# Patient Record
Sex: Female | Born: 1955 | Race: White | Hispanic: No | Marital: Single | Smoking: Never smoker
Health system: Southern US, Community
[De-identification: ages and names within clinical notes are randomized; demographics above are authoritative.]

## PROBLEM LIST (undated history)

## (undated) DIAGNOSIS — K219 Gastro-esophageal reflux disease without esophagitis: Secondary | ICD-10-CM

## (undated) DIAGNOSIS — F419 Anxiety disorder, unspecified: Secondary | ICD-10-CM

## (undated) DIAGNOSIS — Z01419 Encounter for gynecological examination (general) (routine) without abnormal findings: Secondary | ICD-10-CM

## (undated) DIAGNOSIS — M2550 Pain in unspecified joint: Secondary | ICD-10-CM

## (undated) DIAGNOSIS — I776 Arteritis, unspecified: Secondary | ICD-10-CM

## (undated) DIAGNOSIS — C4491 Basal cell carcinoma of skin, unspecified: Secondary | ICD-10-CM

## (undated) DIAGNOSIS — R51 Headache: Secondary | ICD-10-CM

## (undated) DIAGNOSIS — R12 Heartburn: Secondary | ICD-10-CM

## (undated) DIAGNOSIS — N189 Chronic kidney disease, unspecified: Secondary | ICD-10-CM

## (undated) DIAGNOSIS — M1990 Unspecified osteoarthritis, unspecified site: Secondary | ICD-10-CM

## (undated) DIAGNOSIS — R7303 Prediabetes: Secondary | ICD-10-CM

## (undated) DIAGNOSIS — I7782 Antineutrophilic cytoplasmic antibody (ANCA) vasculitis: Secondary | ICD-10-CM

## (undated) DIAGNOSIS — E079 Disorder of thyroid, unspecified: Secondary | ICD-10-CM

## (undated) DIAGNOSIS — G5600 Carpal tunnel syndrome, unspecified upper limb: Secondary | ICD-10-CM

## (undated) DIAGNOSIS — M7989 Other specified soft tissue disorders: Secondary | ICD-10-CM

## (undated) DIAGNOSIS — I10 Essential (primary) hypertension: Secondary | ICD-10-CM

## (undated) HISTORY — DX: Heartburn: R12

## (undated) HISTORY — PX: OTHER SURGICAL HISTORY: SHX169

## (undated) HISTORY — PX: REFRACTIVE SURGERY: SHX103

## (undated) HISTORY — DX: Prediabetes: R73.03

## (undated) HISTORY — DX: Gastro-esophageal reflux disease without esophagitis: K21.9

## (undated) HISTORY — PX: KNEE ARTHROSCOPY: SUR90

## (undated) HISTORY — PX: POLYPECTOMY: SHX149

## (undated) HISTORY — DX: Other specified soft tissue disorders: M79.89

## (undated) HISTORY — DX: Arteritis, unspecified: I77.6

## (undated) HISTORY — DX: Pain in unspecified joint: M25.50

## (undated) HISTORY — PX: CARPAL TUNNEL RELEASE: SHX101

## (undated) HISTORY — DX: Chronic kidney disease, unspecified: N18.9

## (undated) HISTORY — DX: Anxiety disorder, unspecified: F41.9

## (undated) HISTORY — DX: Disorder of thyroid, unspecified: E07.9

## (undated) HISTORY — PX: BUNIONECTOMY: SHX129

## (undated) HISTORY — DX: Encounter for gynecological examination (general) (routine) without abnormal findings: Z01.419

## (undated) HISTORY — PX: TUBAL LIGATION: SHX77

## (undated) HISTORY — DX: Headache: R51

## (undated) HISTORY — DX: Carpal tunnel syndrome, unspecified upper limb: G56.00

## (undated) HISTORY — PX: DILATION AND CURETTAGE OF UTERUS: SHX78

## (undated) HISTORY — PX: RENAL BIOPSY: SHX156

## (undated) HISTORY — DX: Essential (primary) hypertension: I10

## (undated) HISTORY — DX: Antineutrophilic cytoplasmic antibody (ANCA) vasculitis: I77.82

## (undated) HISTORY — DX: Unspecified osteoarthritis, unspecified site: M19.90

---
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
## 1998-03-31 ENCOUNTER — Ambulatory Visit (HOSPITAL_COMMUNITY): Admission: RE | Admit: 1998-03-31 | Discharge: 1998-03-31 | Payer: Self-pay | Admitting: *Deleted

## 1998-10-13 ENCOUNTER — Encounter: Payer: Self-pay | Admitting: *Deleted

## 1998-10-13 ENCOUNTER — Ambulatory Visit (HOSPITAL_COMMUNITY): Admission: RE | Admit: 1998-10-13 | Discharge: 1998-10-13 | Payer: Self-pay | Admitting: *Deleted

## 1999-09-14 ENCOUNTER — Ambulatory Visit (HOSPITAL_COMMUNITY): Admission: RE | Admit: 1999-09-14 | Discharge: 1999-09-14 | Payer: Self-pay | Admitting: *Deleted

## 1999-09-14 ENCOUNTER — Encounter: Payer: Self-pay | Admitting: *Deleted

## 2000-10-10 ENCOUNTER — Encounter: Payer: Self-pay | Admitting: *Deleted

## 2000-10-10 ENCOUNTER — Ambulatory Visit (HOSPITAL_COMMUNITY): Admission: RE | Admit: 2000-10-10 | Discharge: 2000-10-10 | Payer: Self-pay | Admitting: *Deleted

## 2001-02-26 ENCOUNTER — Emergency Department (HOSPITAL_COMMUNITY): Admission: EM | Admit: 2001-02-26 | Discharge: 2001-02-26 | Payer: Self-pay | Admitting: *Deleted

## 2001-03-07 ENCOUNTER — Other Ambulatory Visit: Admission: RE | Admit: 2001-03-07 | Discharge: 2001-03-07 | Payer: Self-pay | Admitting: *Deleted

## 2001-12-09 ENCOUNTER — Encounter: Payer: Self-pay | Admitting: *Deleted

## 2001-12-09 ENCOUNTER — Ambulatory Visit (HOSPITAL_COMMUNITY): Admission: RE | Admit: 2001-12-09 | Discharge: 2001-12-09 | Payer: Self-pay | Admitting: *Deleted

## 2001-12-11 ENCOUNTER — Other Ambulatory Visit: Admission: RE | Admit: 2001-12-11 | Discharge: 2001-12-11 | Payer: Self-pay | Admitting: *Deleted

## 2002-05-07 ENCOUNTER — Encounter: Admission: RE | Admit: 2002-05-07 | Discharge: 2002-05-07 | Payer: Self-pay | Admitting: Family Medicine

## 2002-05-07 ENCOUNTER — Encounter: Payer: Self-pay | Admitting: Family Medicine

## 2002-12-29 ENCOUNTER — Encounter: Payer: Self-pay | Admitting: *Deleted

## 2002-12-29 ENCOUNTER — Ambulatory Visit (HOSPITAL_COMMUNITY): Admission: RE | Admit: 2002-12-29 | Discharge: 2002-12-29 | Payer: Self-pay | Admitting: *Deleted

## 2003-01-13 ENCOUNTER — Other Ambulatory Visit: Admission: RE | Admit: 2003-01-13 | Discharge: 2003-01-13 | Payer: Self-pay | Admitting: *Deleted

## 2004-01-29 ENCOUNTER — Ambulatory Visit (HOSPITAL_COMMUNITY): Admission: RE | Admit: 2004-01-29 | Discharge: 2004-01-29 | Payer: Self-pay | Admitting: Obstetrics and Gynecology

## 2004-02-02 ENCOUNTER — Other Ambulatory Visit: Admission: RE | Admit: 2004-02-02 | Discharge: 2004-02-02 | Payer: Self-pay | Admitting: *Deleted

## 2004-07-29 ENCOUNTER — Ambulatory Visit: Payer: Self-pay | Admitting: Internal Medicine

## 2004-08-05 ENCOUNTER — Ambulatory Visit: Payer: Self-pay | Admitting: Internal Medicine

## 2004-08-12 ENCOUNTER — Ambulatory Visit: Payer: Self-pay | Admitting: Internal Medicine

## 2004-08-19 ENCOUNTER — Ambulatory Visit: Payer: Self-pay | Admitting: Internal Medicine

## 2004-08-26 ENCOUNTER — Ambulatory Visit: Payer: Self-pay | Admitting: Internal Medicine

## 2004-09-02 ENCOUNTER — Ambulatory Visit: Payer: Self-pay | Admitting: Internal Medicine

## 2004-09-09 ENCOUNTER — Ambulatory Visit: Payer: Self-pay | Admitting: Internal Medicine

## 2004-09-16 ENCOUNTER — Ambulatory Visit: Payer: Self-pay | Admitting: Internal Medicine

## 2004-09-23 ENCOUNTER — Ambulatory Visit: Payer: Self-pay | Admitting: Internal Medicine

## 2004-09-30 ENCOUNTER — Ambulatory Visit: Payer: Self-pay | Admitting: Internal Medicine

## 2004-10-07 ENCOUNTER — Ambulatory Visit: Payer: Self-pay | Admitting: Internal Medicine

## 2004-10-14 ENCOUNTER — Ambulatory Visit: Payer: Self-pay | Admitting: Internal Medicine

## 2004-10-28 ENCOUNTER — Ambulatory Visit: Payer: Self-pay | Admitting: Internal Medicine

## 2004-11-11 ENCOUNTER — Ambulatory Visit: Payer: Self-pay | Admitting: Internal Medicine

## 2004-11-18 ENCOUNTER — Ambulatory Visit: Payer: Self-pay | Admitting: Internal Medicine

## 2004-12-02 ENCOUNTER — Ambulatory Visit: Payer: Self-pay | Admitting: Internal Medicine

## 2004-12-16 ENCOUNTER — Ambulatory Visit: Payer: Self-pay | Admitting: Internal Medicine

## 2004-12-30 ENCOUNTER — Ambulatory Visit: Payer: Self-pay | Admitting: Internal Medicine

## 2005-01-13 ENCOUNTER — Ambulatory Visit: Payer: Self-pay | Admitting: Internal Medicine

## 2005-01-27 ENCOUNTER — Ambulatory Visit: Payer: Self-pay | Admitting: Internal Medicine

## 2005-02-03 ENCOUNTER — Ambulatory Visit: Payer: Self-pay | Admitting: Internal Medicine

## 2005-02-17 ENCOUNTER — Ambulatory Visit: Payer: Self-pay | Admitting: Internal Medicine

## 2005-02-24 ENCOUNTER — Ambulatory Visit: Payer: Self-pay | Admitting: Internal Medicine

## 2005-03-03 ENCOUNTER — Ambulatory Visit: Payer: Self-pay | Admitting: Internal Medicine

## 2005-03-10 ENCOUNTER — Ambulatory Visit: Payer: Self-pay | Admitting: Internal Medicine

## 2005-03-24 ENCOUNTER — Ambulatory Visit: Payer: Self-pay | Admitting: Internal Medicine

## 2005-03-31 ENCOUNTER — Ambulatory Visit: Payer: Self-pay | Admitting: Internal Medicine

## 2005-04-03 ENCOUNTER — Ambulatory Visit: Payer: Self-pay | Admitting: Internal Medicine

## 2005-04-14 ENCOUNTER — Ambulatory Visit: Payer: Self-pay | Admitting: Internal Medicine

## 2005-04-21 ENCOUNTER — Ambulatory Visit: Payer: Self-pay | Admitting: Internal Medicine

## 2005-05-12 ENCOUNTER — Ambulatory Visit: Payer: Self-pay | Admitting: Internal Medicine

## 2005-06-02 ENCOUNTER — Ambulatory Visit: Payer: Self-pay | Admitting: Internal Medicine

## 2005-06-09 ENCOUNTER — Ambulatory Visit: Payer: Self-pay | Admitting: Internal Medicine

## 2005-06-23 ENCOUNTER — Ambulatory Visit: Payer: Self-pay | Admitting: Internal Medicine

## 2005-07-07 ENCOUNTER — Ambulatory Visit: Payer: Self-pay | Admitting: Internal Medicine

## 2005-07-21 ENCOUNTER — Ambulatory Visit: Payer: Self-pay | Admitting: Internal Medicine

## 2005-08-02 ENCOUNTER — Other Ambulatory Visit: Admission: RE | Admit: 2005-08-02 | Discharge: 2005-08-02 | Payer: Self-pay | Admitting: Obstetrics and Gynecology

## 2005-08-04 ENCOUNTER — Ambulatory Visit: Payer: Self-pay | Admitting: Internal Medicine

## 2005-08-07 ENCOUNTER — Ambulatory Visit: Payer: Self-pay | Admitting: Internal Medicine

## 2005-08-11 ENCOUNTER — Ambulatory Visit: Payer: Self-pay | Admitting: Internal Medicine

## 2005-08-14 ENCOUNTER — Ambulatory Visit: Payer: Self-pay | Admitting: Family Medicine

## 2005-08-16 ENCOUNTER — Ambulatory Visit: Payer: Self-pay | Admitting: Family Medicine

## 2005-08-18 ENCOUNTER — Ambulatory Visit: Payer: Self-pay | Admitting: Internal Medicine

## 2005-08-18 ENCOUNTER — Encounter: Admission: RE | Admit: 2005-08-18 | Discharge: 2005-08-18 | Payer: Self-pay | Admitting: Obstetrics and Gynecology

## 2005-08-28 ENCOUNTER — Ambulatory Visit (HOSPITAL_BASED_OUTPATIENT_CLINIC_OR_DEPARTMENT_OTHER): Admission: RE | Admit: 2005-08-28 | Discharge: 2005-08-28 | Payer: Self-pay | Admitting: Obstetrics and Gynecology

## 2005-08-28 ENCOUNTER — Encounter (INDEPENDENT_AMBULATORY_CARE_PROVIDER_SITE_OTHER): Payer: Self-pay | Admitting: Specialist

## 2005-09-01 ENCOUNTER — Ambulatory Visit: Payer: Self-pay | Admitting: Internal Medicine

## 2005-09-14 ENCOUNTER — Ambulatory Visit: Payer: Self-pay | Admitting: Internal Medicine

## 2005-09-19 ENCOUNTER — Ambulatory Visit: Payer: Self-pay | Admitting: Internal Medicine

## 2005-09-21 ENCOUNTER — Ambulatory Visit: Payer: Self-pay | Admitting: Internal Medicine

## 2006-02-16 ENCOUNTER — Ambulatory Visit: Payer: Self-pay | Admitting: Internal Medicine

## 2006-04-17 ENCOUNTER — Ambulatory Visit: Payer: Self-pay | Admitting: Family Medicine

## 2006-09-03 ENCOUNTER — Ambulatory Visit: Payer: Self-pay | Admitting: Internal Medicine

## 2006-09-26 ENCOUNTER — Encounter: Admission: RE | Admit: 2006-09-26 | Discharge: 2006-09-26 | Payer: Self-pay | Admitting: Obstetrics and Gynecology

## 2006-10-09 ENCOUNTER — Other Ambulatory Visit: Admission: RE | Admit: 2006-10-09 | Discharge: 2006-10-09 | Payer: Self-pay | Admitting: Obstetrics & Gynecology

## 2006-10-18 ENCOUNTER — Ambulatory Visit: Payer: Self-pay | Admitting: Internal Medicine

## 2007-03-15 ENCOUNTER — Ambulatory Visit: Payer: Self-pay | Admitting: Family Medicine

## 2007-03-15 DIAGNOSIS — F411 Generalized anxiety disorder: Secondary | ICD-10-CM | POA: Insufficient documentation

## 2007-03-15 DIAGNOSIS — I119 Hypertensive heart disease without heart failure: Secondary | ICD-10-CM | POA: Insufficient documentation

## 2007-04-10 ENCOUNTER — Ambulatory Visit: Payer: Self-pay | Admitting: Internal Medicine

## 2007-08-30 ENCOUNTER — Ambulatory Visit: Payer: Self-pay | Admitting: Family Medicine

## 2007-08-30 DIAGNOSIS — R519 Headache, unspecified: Secondary | ICD-10-CM | POA: Insufficient documentation

## 2007-08-30 DIAGNOSIS — K219 Gastro-esophageal reflux disease without esophagitis: Secondary | ICD-10-CM | POA: Insufficient documentation

## 2007-08-30 DIAGNOSIS — R51 Headache: Secondary | ICD-10-CM | POA: Insufficient documentation

## 2007-08-30 DIAGNOSIS — F411 Generalized anxiety disorder: Secondary | ICD-10-CM | POA: Insufficient documentation

## 2007-08-30 DIAGNOSIS — J45998 Other asthma: Secondary | ICD-10-CM | POA: Insufficient documentation

## 2007-08-30 DIAGNOSIS — J309 Allergic rhinitis, unspecified: Secondary | ICD-10-CM | POA: Insufficient documentation

## 2007-08-30 DIAGNOSIS — M1990 Unspecified osteoarthritis, unspecified site: Secondary | ICD-10-CM | POA: Insufficient documentation

## 2007-08-30 DIAGNOSIS — I10 Essential (primary) hypertension: Secondary | ICD-10-CM | POA: Insufficient documentation

## 2007-09-17 ENCOUNTER — Ambulatory Visit: Payer: Self-pay | Admitting: Family Medicine

## 2007-09-18 ENCOUNTER — Encounter: Payer: Self-pay | Admitting: Family Medicine

## 2007-10-07 ENCOUNTER — Encounter: Payer: Self-pay | Admitting: Internal Medicine

## 2007-10-17 ENCOUNTER — Encounter: Admission: RE | Admit: 2007-10-17 | Discharge: 2007-10-17 | Payer: Self-pay | Admitting: Obstetrics and Gynecology

## 2007-10-24 ENCOUNTER — Other Ambulatory Visit: Admission: RE | Admit: 2007-10-24 | Discharge: 2007-10-24 | Payer: Self-pay | Admitting: Obstetrics and Gynecology

## 2007-11-07 ENCOUNTER — Ambulatory Visit: Payer: Self-pay | Admitting: Internal Medicine

## 2007-11-08 ENCOUNTER — Encounter: Payer: Self-pay | Admitting: Internal Medicine

## 2007-11-19 ENCOUNTER — Ambulatory Visit: Payer: Self-pay | Admitting: Internal Medicine

## 2007-11-26 ENCOUNTER — Ambulatory Visit: Payer: Self-pay | Admitting: Family Medicine

## 2008-05-19 ENCOUNTER — Ambulatory Visit: Payer: Self-pay | Admitting: Internal Medicine

## 2008-05-22 ENCOUNTER — Ambulatory Visit: Payer: Self-pay | Admitting: Internal Medicine

## 2008-05-29 ENCOUNTER — Ambulatory Visit: Payer: Self-pay | Admitting: Internal Medicine

## 2008-06-05 ENCOUNTER — Ambulatory Visit: Payer: Self-pay | Admitting: Internal Medicine

## 2008-06-16 ENCOUNTER — Ambulatory Visit: Payer: Self-pay | Admitting: Internal Medicine

## 2008-06-26 ENCOUNTER — Ambulatory Visit: Payer: Self-pay | Admitting: Internal Medicine

## 2008-07-07 ENCOUNTER — Ambulatory Visit: Payer: Self-pay | Admitting: Internal Medicine

## 2008-07-21 ENCOUNTER — Ambulatory Visit: Payer: Self-pay | Admitting: Internal Medicine

## 2008-07-28 ENCOUNTER — Ambulatory Visit: Payer: Self-pay | Admitting: Internal Medicine

## 2008-08-04 ENCOUNTER — Ambulatory Visit: Payer: Self-pay | Admitting: Internal Medicine

## 2008-08-14 ENCOUNTER — Ambulatory Visit: Payer: Self-pay | Admitting: Internal Medicine

## 2008-08-25 ENCOUNTER — Ambulatory Visit: Payer: Self-pay | Admitting: Internal Medicine

## 2008-09-04 ENCOUNTER — Ambulatory Visit: Payer: Self-pay | Admitting: Internal Medicine

## 2008-09-15 ENCOUNTER — Ambulatory Visit: Payer: Self-pay | Admitting: Internal Medicine

## 2008-09-25 ENCOUNTER — Ambulatory Visit: Payer: Self-pay | Admitting: Internal Medicine

## 2008-10-09 ENCOUNTER — Ambulatory Visit: Payer: Self-pay | Admitting: Internal Medicine

## 2008-10-27 ENCOUNTER — Ambulatory Visit: Payer: Self-pay | Admitting: Internal Medicine

## 2008-10-28 ENCOUNTER — Other Ambulatory Visit: Admission: RE | Admit: 2008-10-28 | Discharge: 2008-10-28 | Payer: Self-pay | Admitting: Obstetrics & Gynecology

## 2008-11-03 ENCOUNTER — Ambulatory Visit: Payer: Self-pay | Admitting: Internal Medicine

## 2008-11-03 ENCOUNTER — Encounter: Admission: RE | Admit: 2008-11-03 | Discharge: 2008-11-03 | Payer: Self-pay | Admitting: Obstetrics and Gynecology

## 2008-11-10 ENCOUNTER — Ambulatory Visit: Payer: Self-pay | Admitting: Internal Medicine

## 2008-11-11 ENCOUNTER — Ambulatory Visit: Payer: Self-pay | Admitting: Internal Medicine

## 2008-11-13 ENCOUNTER — Ambulatory Visit: Payer: Self-pay | Admitting: Internal Medicine

## 2008-11-17 ENCOUNTER — Ambulatory Visit: Payer: Self-pay | Admitting: Internal Medicine

## 2008-11-24 ENCOUNTER — Ambulatory Visit: Payer: Self-pay | Admitting: Internal Medicine

## 2008-11-27 ENCOUNTER — Ambulatory Visit: Payer: Self-pay | Admitting: Internal Medicine

## 2008-11-27 ENCOUNTER — Encounter: Payer: Self-pay | Admitting: Internal Medicine

## 2008-11-27 HISTORY — PX: COLONOSCOPY: SHX174

## 2008-12-01 ENCOUNTER — Encounter: Payer: Self-pay | Admitting: Internal Medicine

## 2008-12-04 ENCOUNTER — Ambulatory Visit: Payer: Self-pay | Admitting: Family Medicine

## 2008-12-04 ENCOUNTER — Ambulatory Visit: Payer: Self-pay | Admitting: Internal Medicine

## 2008-12-08 ENCOUNTER — Ambulatory Visit: Payer: Self-pay | Admitting: Internal Medicine

## 2008-12-18 ENCOUNTER — Ambulatory Visit: Payer: Self-pay | Admitting: Internal Medicine

## 2009-01-01 ENCOUNTER — Ambulatory Visit: Payer: Self-pay | Admitting: Internal Medicine

## 2009-01-08 ENCOUNTER — Ambulatory Visit: Payer: Self-pay | Admitting: Internal Medicine

## 2009-01-22 ENCOUNTER — Ambulatory Visit: Payer: Self-pay | Admitting: Internal Medicine

## 2009-01-29 ENCOUNTER — Ambulatory Visit: Payer: Self-pay | Admitting: Internal Medicine

## 2009-02-05 ENCOUNTER — Ambulatory Visit: Payer: Self-pay | Admitting: Internal Medicine

## 2009-02-19 ENCOUNTER — Ambulatory Visit: Payer: Self-pay | Admitting: Internal Medicine

## 2009-03-05 ENCOUNTER — Ambulatory Visit: Payer: Self-pay | Admitting: Internal Medicine

## 2009-03-19 ENCOUNTER — Ambulatory Visit: Payer: Self-pay | Admitting: Internal Medicine

## 2009-03-23 ENCOUNTER — Ambulatory Visit: Payer: Self-pay | Admitting: Internal Medicine

## 2009-03-30 ENCOUNTER — Ambulatory Visit: Payer: Self-pay | Admitting: Internal Medicine

## 2009-04-09 ENCOUNTER — Ambulatory Visit: Payer: Self-pay | Admitting: Internal Medicine

## 2009-04-16 ENCOUNTER — Ambulatory Visit: Payer: Self-pay | Admitting: Internal Medicine

## 2009-04-21 ENCOUNTER — Ambulatory Visit: Payer: Self-pay | Admitting: Internal Medicine

## 2009-04-27 ENCOUNTER — Ambulatory Visit: Payer: Self-pay | Admitting: Internal Medicine

## 2009-05-14 ENCOUNTER — Ambulatory Visit: Payer: Self-pay | Admitting: Internal Medicine

## 2009-05-21 ENCOUNTER — Ambulatory Visit: Payer: Self-pay | Admitting: Internal Medicine

## 2009-05-22 ENCOUNTER — Ambulatory Visit: Payer: Self-pay | Admitting: Internal Medicine

## 2009-06-01 ENCOUNTER — Ambulatory Visit: Payer: Self-pay | Admitting: Internal Medicine

## 2009-06-11 ENCOUNTER — Ambulatory Visit: Payer: Self-pay | Admitting: Internal Medicine

## 2009-06-15 ENCOUNTER — Ambulatory Visit: Payer: Self-pay | Admitting: Family Medicine

## 2009-06-25 ENCOUNTER — Ambulatory Visit: Payer: Self-pay | Admitting: Internal Medicine

## 2009-07-06 ENCOUNTER — Ambulatory Visit: Payer: Self-pay | Admitting: Internal Medicine

## 2009-07-15 ENCOUNTER — Ambulatory Visit: Payer: Self-pay | Admitting: Internal Medicine

## 2009-07-22 ENCOUNTER — Ambulatory Visit: Payer: Self-pay | Admitting: Internal Medicine

## 2009-08-06 ENCOUNTER — Ambulatory Visit: Payer: Self-pay | Admitting: Internal Medicine

## 2009-08-13 ENCOUNTER — Ambulatory Visit: Payer: Self-pay | Admitting: Internal Medicine

## 2009-08-27 ENCOUNTER — Telehealth: Payer: Self-pay | Admitting: Family Medicine

## 2009-09-02 ENCOUNTER — Ambulatory Visit: Payer: Self-pay | Admitting: Internal Medicine

## 2009-09-17 ENCOUNTER — Ambulatory Visit: Payer: Self-pay | Admitting: Internal Medicine

## 2009-09-30 ENCOUNTER — Ambulatory Visit: Payer: Self-pay | Admitting: Internal Medicine

## 2009-10-08 ENCOUNTER — Ambulatory Visit: Payer: Self-pay | Admitting: Internal Medicine

## 2009-10-22 ENCOUNTER — Ambulatory Visit: Payer: Self-pay | Admitting: Internal Medicine

## 2009-10-29 ENCOUNTER — Ambulatory Visit: Payer: Self-pay | Admitting: Internal Medicine

## 2009-11-01 ENCOUNTER — Ambulatory Visit: Payer: Self-pay | Admitting: Internal Medicine

## 2009-11-05 ENCOUNTER — Ambulatory Visit: Payer: Self-pay | Admitting: Internal Medicine

## 2009-11-09 ENCOUNTER — Ambulatory Visit (HOSPITAL_COMMUNITY): Admission: RE | Admit: 2009-11-09 | Discharge: 2009-11-09 | Payer: Self-pay | Admitting: Obstetrics and Gynecology

## 2009-11-16 ENCOUNTER — Ambulatory Visit: Payer: Self-pay | Admitting: Internal Medicine

## 2009-11-26 ENCOUNTER — Ambulatory Visit: Payer: Self-pay | Admitting: Internal Medicine

## 2009-12-03 ENCOUNTER — Ambulatory Visit: Payer: Self-pay | Admitting: Internal Medicine

## 2009-12-10 ENCOUNTER — Ambulatory Visit: Payer: Self-pay | Admitting: Internal Medicine

## 2009-12-24 ENCOUNTER — Ambulatory Visit: Payer: Self-pay | Admitting: Internal Medicine

## 2009-12-31 ENCOUNTER — Ambulatory Visit: Payer: Self-pay | Admitting: Internal Medicine

## 2010-01-07 ENCOUNTER — Ambulatory Visit: Payer: Self-pay | Admitting: Internal Medicine

## 2010-01-28 ENCOUNTER — Ambulatory Visit: Payer: Self-pay | Admitting: Internal Medicine

## 2010-02-04 ENCOUNTER — Ambulatory Visit: Payer: Self-pay | Admitting: Internal Medicine

## 2010-02-18 ENCOUNTER — Ambulatory Visit: Payer: Self-pay | Admitting: Internal Medicine

## 2010-02-25 ENCOUNTER — Ambulatory Visit: Payer: Self-pay | Admitting: Internal Medicine

## 2010-03-11 ENCOUNTER — Ambulatory Visit: Payer: Self-pay | Admitting: Internal Medicine

## 2010-03-17 ENCOUNTER — Ambulatory Visit: Payer: Self-pay | Admitting: Internal Medicine

## 2010-03-31 ENCOUNTER — Ambulatory Visit: Payer: Self-pay | Admitting: Internal Medicine

## 2010-04-08 ENCOUNTER — Ambulatory Visit: Payer: Self-pay | Admitting: Internal Medicine

## 2010-04-14 ENCOUNTER — Ambulatory Visit: Payer: Self-pay | Admitting: Internal Medicine

## 2010-04-22 ENCOUNTER — Ambulatory Visit: Payer: Self-pay | Admitting: Internal Medicine

## 2010-04-29 ENCOUNTER — Ambulatory Visit: Payer: Self-pay | Admitting: Internal Medicine

## 2010-05-06 ENCOUNTER — Ambulatory Visit: Payer: Self-pay | Admitting: Internal Medicine

## 2010-05-20 ENCOUNTER — Ambulatory Visit: Payer: Self-pay | Admitting: Internal Medicine

## 2010-05-27 ENCOUNTER — Ambulatory Visit: Payer: Self-pay | Admitting: Internal Medicine

## 2010-06-10 ENCOUNTER — Ambulatory Visit: Payer: Self-pay | Admitting: Internal Medicine

## 2010-06-24 ENCOUNTER — Ambulatory Visit: Payer: Self-pay | Admitting: Internal Medicine

## 2010-07-19 ENCOUNTER — Ambulatory Visit: Payer: Self-pay | Admitting: Internal Medicine

## 2010-08-08 ENCOUNTER — Ambulatory Visit: Payer: Self-pay | Admitting: Internal Medicine

## 2010-08-18 ENCOUNTER — Ambulatory Visit: Payer: Self-pay | Admitting: Internal Medicine

## 2010-08-22 ENCOUNTER — Ambulatory Visit: Payer: Self-pay | Admitting: Internal Medicine

## 2010-08-25 NOTE — Miscellaneous (Signed)
Summary: Injection Record/[facility]  Injection Record/[facility]   Imported By: Sherian Rein 11/24/2009 14:57:46  _____________________________________________________________________  External Attachment:    Type:   Image     Comment:   External Document

## 2010-08-25 NOTE — Progress Notes (Signed)
Summary: MED REQ  Phone Note Call from Patient   Caller: Patient [phone] Reason for Call: Talk to Nurse Summary of Call: Pt called to adv that [facility] has informed her (via mailed letter)  that her RX for this med (Protonix 40 Mg  Tbec (Pantoprazole Sodium)  is about to expire and the med will need to be renewed..... Rx for same needs to be faxed to [facility].  Pt can be contacted at [phone] with any questions or concerns.  Initial call taken by: Debbra Riding,  August 27, 2009 11:00 AM  Follow-up for Phone Call        Phone Call Completed, Rx Called In Follow-up by: Alfred Levins, CMA,  August 27, 2009 11:30 AM    Prescriptions: PROTONIX 40 MG  TBEC (PANTOPRAZOLE SODIUM) once daily  #90 x 3   Entered by:   Alfred Levins, CMA   Authorized by:   Nelwyn Salisbury MD   Signed by:   Alfred Levins, CMA on 08/27/2009   Method used:   Electronically to        [facility]* (mail-order)             ,          Ph: [phone]       Fax: [phone]   RxID:   [id number]

## 2010-08-25 NOTE — Miscellaneous (Signed)
Summary: Injection Record / [facility]    Injection Record / [facility]    Imported By: Lennie Odor 03/25/2010 11:08:54  _____________________________________________________________________  External Attachment:    Type:   Image     Comment:   External Document

## 2010-08-25 NOTE — Miscellaneous (Signed)
Summary: Injection record  Injection record   Imported By: Lester Branch 08/05/2010 11:56:19  _____________________________________________________________________  External Attachment:    Type:   Image     Comment:   External Document

## 2010-08-25 NOTE — Assessment & Plan Note (Signed)
Summary: 6 months/ mbw   Primary Provider/Referring Provider:  Gershon Crane  CC:  6 mth rov - Denies cob, wheezing, and cough.  History of Present Illness:  11/17/08 allergic rhinitis, asthma Not much stuffy nose, but gets headaches. More frequent need for rescue inhaler as much as once a day when she feels 'weird at work". Loses voice briefly, light headed. Relieved by sitting quietly for awhile. Can exercise without triggering these feelings. Sometimes gets tingley paresthesias and her heart rate picks up.   May 21, 2009- Allergic rhinitis, Asthma Still occasional episode where she feels she can't get comfortable breath in- not going in.  Had episode when she got oxygen at a ballgame for numb hand- resolved- to be discussed with Dr Clent Ridges. Today feels well. Gets itchy eyes, but not sneeze, congestion, cough or wheeze. Rarely needing rescue inhaler and no antihistamine. Does well with allergy shots. PFT- Nl flows, slight restriction, mild reduction of DLCO. Can exercise on elliptical machine at gym. Got flu vax at work.  November 16, 2009- Allergic rhintis, Asthma She has done well thought the Winter and Spring so far with no major pollen problems. She continues maintenance Advair. Hasn't been needing rescue inhaler much- less than during the Fall. Allergy vaccine doing ok. Needs Epipen update.  Current Medications (verified): 1)  Diovan Hct 160-25 Mg  Tabs (Valsartan-Hydrochlorothiazide) .Marland Kitchen.. 1 By Mouth Once Daily 2)  Verapamil Hcl Cr 120 Mg  Cp24 (Verapamil Hcl) .... Once Daily 3)  Advair Diskus 250-50 Mcg/dose Misc (Fluticasone-Salmeterol) .Marland Kitchen.. 1 Puff Two Times A Day 4)  Proair Hfa 108 (90 Base) Mcg/act  Aers (Albuterol Sulfate) .... 2 Puffs Every 4 Hours As Needed Sob 5)  Protonix 40 Mg  Tbec (Pantoprazole Sodium) .... Once Daily 6)  Allergy Vaccine 1:10 Go (W-E) 7)  Epipen 0.3 Mg/0.82ml (1:1000)  Devi (Epinephrine Hcl (Anaphylaxis))  Allergies (verified): 1)  ! Amoxicillin  Past  History:  Past Medical History: Last updated: 08/30/2007 Anxiety Asthma, sees Dr. Maple Hudson GERD Hypertension Allergic rhinitis (gets shots per Dr. Maple Hudson) Headache Osteoarthritis carpal tunnel, sees Dr. Amanda Pea  Past Surgical History: Last updated: 12/04/2008 bunionectomy colonoscopy 11-27-08 per Dr. Juanda Chance, repeat in 7 yrs  Family History: Last updated: 11/17/2008 Family History of Arthritis Family History of CAD Female 1st degree relative <50 Father and uncle died early of MI Family History Diabetes 1st degree relative Family History Hypertension Family History of Stroke M 1st degree relative <50 Mother- died liver cancer  Social History: Last updated: 08/30/2007 Married Never Smoked Alcohol use-no  Risk Factors: Smoking Status: never (12/04/2008)  Review of Systems      See HPI  The patient denies anorexia, fever, weight loss, weight gain, vision loss, decreased hearing, hoarseness, chest pain, syncope, dyspnea on exertion, peripheral edema, prolonged cough, headaches, hemoptysis, and severe indigestion/heartburn.    Vital Signs:  Patient profile:   55 year old female Weight:      253 pounds O2 Sat:      95 % on Room air Pulse rate:   82 / minute BP sitting:   112 / 70  (left arm) Cuff size:   large  Vitals Entered By: Abigail Miyamoto RN (November 16, 2009 10:47 AM)  O2 Flow:  Room air  Physical Exam  Additional Exam:  General: A/Ox3; pleasant and cooperative, NAD, SKIN: no rash, lesions NODES: no lymphadenopathy HEENT: [place]/AT, EOM- WNL, Conjuctivae- clear, PERRLA, TM-WNL, Nose- clear, Throat- , voice normal, no stridor, .Mallampati  II NECK: Supple w/ fair ROM,  JVD- none, normal carotid impulses w/o bruits Thyroid- CHEST: Clear to P&A HEART: RRR, no m/g/r heard ABDOMEN:  UEA:VWUJ, nl pulses, no edema  NEURO: Grossly intact to observation      Impression & Recommendations:  Problem # 1:  ALLERGIC RHINITIS (ICD-477.9)  Good control now with little  difficulty through tree pollen season. We are refilling her epipen As control gets better the hx of previous problems seems remote.  Problem # 2:  ASTHMA (ICD-493.90) Good control with no changes needed. Meds discussed.  Medications Added to Medication List This Visit: 1)  Protonix 40 Mg Tbec (Pantoprazole sodium) .... Once daily 2)  Epipen 0.3 Mg/0.31ml (1:1000) Devi (Epinephrine hcl (anaphylaxis)) .... For sevfere allergic reaction  Other Orders: Est. Patient Level III (81191)  Patient Instructions: 1)  Please schedule a follow-up appointment in 1 year. Call sooner as needed 2)  Refill for Epipen sent to your drug store. 3)  Continue allergy vaccine. Prescriptions: EPIPEN 0.3 MG/0.3ML (1:1000)  DEVI (EPINEPHRINE HCL (ANAPHYLAXIS)) For sevfere allergic reaction  #1 x prn   Entered and Authorized by:   Waymon Budge MD   Signed by:   Waymon Budge MD on 11/16/2009   Method used:   Electron[facility] to        [facility] [phone]* (retail)       [address]       [place], [place]  [address]       Ph: [phone] or [phone]       Fax: [phone]   RxID:   [id number]    Influenza Immunization History:    Influenza # 1:  Historical (05/04/2009)

## 2010-09-06 ENCOUNTER — Ambulatory Visit (INDEPENDENT_AMBULATORY_CARE_PROVIDER_SITE_OTHER): Payer: BC Managed Care – PPO

## 2010-09-06 DIAGNOSIS — J301 Allergic rhinitis due to pollen: Secondary | ICD-10-CM

## 2010-09-08 ENCOUNTER — Other Ambulatory Visit: Payer: Self-pay | Admitting: Family Medicine

## 2010-09-16 ENCOUNTER — Ambulatory Visit (INDEPENDENT_AMBULATORY_CARE_PROVIDER_SITE_OTHER): Payer: BC Managed Care – PPO

## 2010-09-16 DIAGNOSIS — J301 Allergic rhinitis due to pollen: Secondary | ICD-10-CM

## 2010-09-29 ENCOUNTER — Ambulatory Visit (INDEPENDENT_AMBULATORY_CARE_PROVIDER_SITE_OTHER): Payer: BC Managed Care – PPO

## 2010-09-29 ENCOUNTER — Encounter: Payer: Self-pay | Admitting: Internal Medicine

## 2010-09-29 DIAGNOSIS — J301 Allergic rhinitis due to pollen: Secondary | ICD-10-CM

## 2010-10-04 NOTE — Assessment & Plan Note (Signed)
Summary: allergy/cb  Nurse Visit   Allergies: 1)  ! Amoxicillin  Orders Added: 1)  Allergy Injection (1) [95115] 

## 2010-10-10 ENCOUNTER — Encounter: Payer: Self-pay | Admitting: Internal Medicine

## 2010-10-10 ENCOUNTER — Ambulatory Visit (INDEPENDENT_AMBULATORY_CARE_PROVIDER_SITE_OTHER): Payer: BC Managed Care – PPO

## 2010-10-10 DIAGNOSIS — J301 Allergic rhinitis due to pollen: Secondary | ICD-10-CM

## 2010-10-20 NOTE — Assessment & Plan Note (Signed)
Summary: allergy/cb  Nurse Visit   Allergies: 1)  ! Amoxicillin  Orders Added: 1)  Allergy Injection (1) [16109]

## 2010-10-21 ENCOUNTER — Ambulatory Visit (INDEPENDENT_AMBULATORY_CARE_PROVIDER_SITE_OTHER): Payer: BC Managed Care – PPO

## 2010-10-21 DIAGNOSIS — J301 Allergic rhinitis due to pollen: Secondary | ICD-10-CM

## 2010-11-01 ENCOUNTER — Ambulatory Visit (INDEPENDENT_AMBULATORY_CARE_PROVIDER_SITE_OTHER): Payer: BC Managed Care – PPO

## 2010-11-01 DIAGNOSIS — J301 Allergic rhinitis due to pollen: Secondary | ICD-10-CM

## 2010-11-02 ENCOUNTER — Other Ambulatory Visit: Payer: Self-pay | Admitting: Obstetrics and Gynecology

## 2010-11-02 DIAGNOSIS — Z1231 Encounter for screening mammogram for malignant neoplasm of breast: Secondary | ICD-10-CM

## 2010-11-15 ENCOUNTER — Encounter: Payer: Self-pay | Admitting: Internal Medicine

## 2010-11-15 ENCOUNTER — Ambulatory Visit (INDEPENDENT_AMBULATORY_CARE_PROVIDER_SITE_OTHER): Payer: BC Managed Care – PPO

## 2010-11-15 ENCOUNTER — Ambulatory Visit (HOSPITAL_COMMUNITY)
Admission: RE | Admit: 2010-11-15 | Discharge: 2010-11-15 | Disposition: A | Discharge status: Home / Self Care | Payer: BC Managed Care – PPO | Source: Ambulatory Visit | Attending: Obstetrics and Gynecology | Admitting: Obstetrics and Gynecology

## 2010-11-15 ENCOUNTER — Ambulatory Visit (INDEPENDENT_AMBULATORY_CARE_PROVIDER_SITE_OTHER): Payer: BC Managed Care – PPO | Admitting: Internal Medicine

## 2010-11-15 VITALS — BP 134/62 | HR 76 | Ht 67.0 in | Wt 255.4 lb

## 2010-11-15 DIAGNOSIS — J45909 Unspecified asthma, uncomplicated: Secondary | ICD-10-CM

## 2010-11-15 DIAGNOSIS — Z1231 Encounter for screening mammogram for malignant neoplasm of breast: Secondary | ICD-10-CM

## 2010-11-15 DIAGNOSIS — J309 Allergic rhinitis, unspecified: Secondary | ICD-10-CM

## 2010-11-15 DIAGNOSIS — J301 Allergic rhinitis due to pollen: Secondary | ICD-10-CM

## 2010-11-15 NOTE — Patient Instructions (Signed)
Continue allergy vaccine  Please call as needed 

## 2010-11-15 NOTE — Assessment & Plan Note (Addendum)
Doing very well for the time of year, sufficiently controlled on allergy vaccine without need for antihistmines.

## 2010-11-15 NOTE — Progress Notes (Signed)
  Subjective:    Patient ID: Gabriela Shepherd, female    DOB: 1955-12-03, 55 y.o.   MRN: [id number]  HPI 13 yoF followed for allergic rhinitis and asthma. Last here November 16, 2009 reporting a good year at that time.  Continues allergy vaccine 1:10 here and thinks the shots help "I can breathe". Continues Advair. Went months not needing Proair, but used it about 5 times in past month. One episode after strong fragrance exposure. No bad episodes. Denies any nasal symptoms- not needing antihistamines.    Review of Systems See HPI   Constitutional:   No weight loss, night sweats,  Fevers, chills, fatigue, lassitude. HEENT:   No headaches,  Difficulty swallowing,  Tooth/dental problems,  Sore throat,                No sneezing, itching, ear ache, nasal congestion, post nasal drip,   CV:  No chest pain,  Orthopnea, PND, swelling in lower extremities, anasarca, dizziness, palpitations  GI  No heartburn, indigestion, abdominal pain, nausea, vomiting, diarrhea, change in bowel habits, loss of appetite  Resp: No shortness of breath with exertion or at rest.  No excess mucus, no productive cough,  No non-productive cough,  No coughing up of blood.  No change in color of mucus.  No wheezing.   Skin: no rash or lesions.  GU: no dysuria, change in color of urine, no urgency or frequency.  No flank pain.  MS:  No joint pain or swelling.  No decreased range of motion.  No back pain.  Psych:  No change in mood or affect. No depression or anxiety.  No memory loss.   Objective:   Physical Exam General- Alert, Oriented, Affect-appropriate, Distress- none acute  overweight  Skin- rash-none, lesions- none, excoriation- none  Lymphadenopathy- none  Head- atraumatic  Eyes- Gross vision intact, PERRLA, conjunctivae clear secretions  Ears- Normal-  Hearing, canals, Tm L ,   R ,  Nose- Clear, No- Septal dev, mucus, polyps, erosion, perforation   Throat- Mallampati II-III , mucosa clear , drainage-  none, tonsils- atrophic  Neck- flexible , trachea midline, no stridor , thyroid nl, carotid no bruit  Chest - symmetrical excursion , unlabored     Heart/CV- RRR , no murmur , no gallop  , no rub, nl s1 s2                     - JVD- none , edema- none, stasis changes- none, varices- none     Lung- clear to P&A, wheeze- none, cough- none , dullness-none, rub- none   One dry cough after deep breath     Chest wall- atraumatic, no scar  Abd- tender-no, distended-no, bowel sounds-present, HSM- no  Br/ Gen/ Rectal- Not done, not indicated  Extrem- cyanosis- none, clubbing, none, atrophy- none, strength- nl  Neuro- grossly intact to observation         Assessment & Plan:

## 2010-11-15 NOTE — Assessment & Plan Note (Signed)
Only occasional need for her rescue inhaler even during a pollen season. She is doing well with no changes needed.

## 2010-11-16 ENCOUNTER — Encounter: Payer: Self-pay | Admitting: Internal Medicine

## 2010-11-28 ENCOUNTER — Ambulatory Visit (INDEPENDENT_AMBULATORY_CARE_PROVIDER_SITE_OTHER): Payer: BC Managed Care – PPO

## 2010-11-28 DIAGNOSIS — J309 Allergic rhinitis, unspecified: Secondary | ICD-10-CM

## 2010-12-06 ENCOUNTER — Ambulatory Visit (INDEPENDENT_AMBULATORY_CARE_PROVIDER_SITE_OTHER): Payer: BC Managed Care – PPO

## 2010-12-06 DIAGNOSIS — J309 Allergic rhinitis, unspecified: Secondary | ICD-10-CM

## 2010-12-09 NOTE — Op Note (Signed)
NAME:  Gabriela Shepherd, Gabriela Shepherd             ACCOUNT NO.:  [id number]   MEDICAL RECORD NO.:  [id number]          PATIENT TYPE:  AMB   LOCATION:  [facility]                         FACILITY:  [facility]   PHYSICIAN:  Cynthia P. Romine, M.D.DATE OF BIRTH:  [birth year]   DATE OF PROCEDURE:  08/28/2005  DATE OF DISCHARGE:                                 OPERATIVE REPORT   PREOPERATIVE DIAGNOSIS:  Menorrhagia with large endocervical polyp.   POSTOPERATIVE DIAGNOSIS:  Menorrhagia with large endocervical polyp.  Path  pending.   PROCEDURE:  Hysteroscopy with resection of endocervical polyp and D&C.   SURGEON:  Dr. Arline Asp Romine.   ANESTHESIA:  General by LMA.   ESTIMATED BLOOD LOSS:  Minimal.   SORBITOL DEFICIT:  40 mL.   COMPLICATIONS:  None.   PROCEDURE:  The patient was taken to the operating room and after induction  of adequate general anesthesia by LMA, was placed in dorsal lithotomy  position and prepped and draped usual fashion. The bladder was drained with  red rubber catheter.  A posterior weighted and anterior Sims retractor were  placed and a paracervical block was instituted using 10 mL of 1% Xylocaine  at each of 3 and 9 o'clock.  The uterus then sounded to 8 cm. The cervix was  dilated to #33 Shawnie Pons.  The operative hysteroscope was introduced. The  endometrial cavity appeared clean. There was a small clot at the os on the  left side that was broken up and then removed. The cavity actually appeared  quite smooth and the endometrium quite thin.  As scope was withdrawn,  however, there was a large polyp filling the endocervical canal. This was  removed with the resectoscope and also with the polyp forceps and sent to  pathology.  After hysteroscopic resection of polyp, sharp curettage was done  of the endometrial cavity and also sent to pathology.  The procedure was  then terminated. The instruments were removed from the vagina and the  patient was taken to the recovery room in satisfactory  condition.           ______________________________  Edwena Felty. Romine, M.D.     CPR/MEDQ  D:  08/28/2005  T:  08/28/2005  Job:  [id number]

## 2010-12-09 NOTE — Assessment & Plan Note (Signed)
[facility]                             PULMONARY OFFICE NOTE   PRAISE, STENNETT                    MRN:          [id number]  DATE:09/03/2006                            DOB:          [birth year]    PROBLEM:  Asthma.   HISTORY:  She continues her allergy vaccine at 1-10 with injections  given by her daughter. This worked out best because she now works in  [place] where her daughter lives. We have reviewed risk and  benefit including a discussion of administration outside of medical  office, anaphylaxis, and epinephrine. She is pending knee surgery on  Thursday and feels stable currently. She says she usually feels quite  well, rarely a little bit of chest congestion and nasal congestion. Note  that she never smoked and lives with three cats and five dogs. Her  daughter no longer has a rabbit which apparently had been a triggering  exposure in the past when visiting.   MEDICATIONS:  1. Allergy vaccine.  2. Advair 250/50.  3. Diovan/HCT.  4. Verapamil.  5. Albuterol inhaler.  Drug intolerant of PENICILLIN.   OBJECTIVE:  Weight 200 pounds, blood pressure 120/68, pulse regular 74,  room air saturation 99%. She looks alert and comfortable. Nose and  throat are distinctly clear. Chest is clear to PNA with no wheeze,  cough, or increased work of breathing. No neck vein distension or  strider. Heart sounds are regular without murmur or gallop. There is no  peripheral edema.   IMPRESSION:  Asthma, currently under good control.   PLAN:  Continue vaccine with discussion as above. Albuterol refill.  Schedule return one year, earlier p.r.n.     Clinton D. Maple Hudson, MD, Tonny Bollman, FACP  Electronically Signed    CDY/MedQ  DD: 09/09/2006  DT: 09/10/2006  Job #: [id number]   cc:   Jeannett Senior A. Clent Ridges, MD

## 2010-12-16 ENCOUNTER — Ambulatory Visit (INDEPENDENT_AMBULATORY_CARE_PROVIDER_SITE_OTHER): Payer: BC Managed Care – PPO

## 2010-12-16 DIAGNOSIS — J309 Allergic rhinitis, unspecified: Secondary | ICD-10-CM

## 2010-12-23 ENCOUNTER — Other Ambulatory Visit: Payer: Self-pay | Admitting: Family Medicine

## 2010-12-27 ENCOUNTER — Ambulatory Visit (INDEPENDENT_AMBULATORY_CARE_PROVIDER_SITE_OTHER): Payer: BC Managed Care – PPO

## 2010-12-27 DIAGNOSIS — J309 Allergic rhinitis, unspecified: Secondary | ICD-10-CM

## 2011-01-06 ENCOUNTER — Ambulatory Visit (INDEPENDENT_AMBULATORY_CARE_PROVIDER_SITE_OTHER): Payer: BC Managed Care – PPO

## 2011-01-06 DIAGNOSIS — J309 Allergic rhinitis, unspecified: Secondary | ICD-10-CM

## 2011-01-09 ENCOUNTER — Encounter: Payer: Self-pay | Admitting: Internal Medicine

## 2011-01-17 ENCOUNTER — Ambulatory Visit (INDEPENDENT_AMBULATORY_CARE_PROVIDER_SITE_OTHER): Payer: BC Managed Care – PPO

## 2011-01-17 DIAGNOSIS — J309 Allergic rhinitis, unspecified: Secondary | ICD-10-CM

## 2011-02-02 ENCOUNTER — Ambulatory Visit (INDEPENDENT_AMBULATORY_CARE_PROVIDER_SITE_OTHER): Payer: BC Managed Care – PPO

## 2011-02-02 DIAGNOSIS — J309 Allergic rhinitis, unspecified: Secondary | ICD-10-CM

## 2011-02-14 ENCOUNTER — Ambulatory Visit (INDEPENDENT_AMBULATORY_CARE_PROVIDER_SITE_OTHER): Payer: BC Managed Care – PPO

## 2011-02-14 DIAGNOSIS — J309 Allergic rhinitis, unspecified: Secondary | ICD-10-CM

## 2011-02-28 ENCOUNTER — Ambulatory Visit (INDEPENDENT_AMBULATORY_CARE_PROVIDER_SITE_OTHER): Payer: BC Managed Care – PPO

## 2011-02-28 DIAGNOSIS — J309 Allergic rhinitis, unspecified: Secondary | ICD-10-CM

## 2011-03-14 ENCOUNTER — Ambulatory Visit (INDEPENDENT_AMBULATORY_CARE_PROVIDER_SITE_OTHER): Payer: BC Managed Care – PPO

## 2011-03-14 DIAGNOSIS — J309 Allergic rhinitis, unspecified: Secondary | ICD-10-CM

## 2011-03-21 ENCOUNTER — Ambulatory Visit (INDEPENDENT_AMBULATORY_CARE_PROVIDER_SITE_OTHER): Payer: BC Managed Care – PPO

## 2011-03-21 DIAGNOSIS — J309 Allergic rhinitis, unspecified: Secondary | ICD-10-CM

## 2011-04-04 ENCOUNTER — Ambulatory Visit (INDEPENDENT_AMBULATORY_CARE_PROVIDER_SITE_OTHER): Payer: BC Managed Care – PPO

## 2011-04-04 DIAGNOSIS — J309 Allergic rhinitis, unspecified: Secondary | ICD-10-CM

## 2011-04-21 ENCOUNTER — Ambulatory Visit (INDEPENDENT_AMBULATORY_CARE_PROVIDER_SITE_OTHER): Payer: BC Managed Care – PPO

## 2011-04-21 DIAGNOSIS — J309 Allergic rhinitis, unspecified: Secondary | ICD-10-CM

## 2011-04-23 ENCOUNTER — Other Ambulatory Visit: Payer: Self-pay | Admitting: Internal Medicine

## 2011-05-05 ENCOUNTER — Ambulatory Visit (INDEPENDENT_AMBULATORY_CARE_PROVIDER_SITE_OTHER): Payer: BC Managed Care – PPO

## 2011-05-05 DIAGNOSIS — J309 Allergic rhinitis, unspecified: Secondary | ICD-10-CM

## 2011-05-12 ENCOUNTER — Ambulatory Visit (INDEPENDENT_AMBULATORY_CARE_PROVIDER_SITE_OTHER): Payer: BC Managed Care – PPO

## 2011-05-12 DIAGNOSIS — J309 Allergic rhinitis, unspecified: Secondary | ICD-10-CM

## 2011-05-19 ENCOUNTER — Ambulatory Visit (INDEPENDENT_AMBULATORY_CARE_PROVIDER_SITE_OTHER): Payer: BC Managed Care – PPO

## 2011-05-19 DIAGNOSIS — J309 Allergic rhinitis, unspecified: Secondary | ICD-10-CM

## 2011-05-26 ENCOUNTER — Ambulatory Visit (INDEPENDENT_AMBULATORY_CARE_PROVIDER_SITE_OTHER): Payer: BC Managed Care – PPO

## 2011-05-26 DIAGNOSIS — J309 Allergic rhinitis, unspecified: Secondary | ICD-10-CM

## 2011-06-07 ENCOUNTER — Encounter: Payer: Self-pay | Admitting: Internal Medicine

## 2011-06-13 ENCOUNTER — Ambulatory Visit (INDEPENDENT_AMBULATORY_CARE_PROVIDER_SITE_OTHER): Payer: BC Managed Care – PPO

## 2011-06-13 DIAGNOSIS — J309 Allergic rhinitis, unspecified: Secondary | ICD-10-CM

## 2011-06-14 ENCOUNTER — Ambulatory Visit (INDEPENDENT_AMBULATORY_CARE_PROVIDER_SITE_OTHER): Payer: BC Managed Care – PPO

## 2011-06-14 DIAGNOSIS — J309 Allergic rhinitis, unspecified: Secondary | ICD-10-CM

## 2011-06-27 ENCOUNTER — Ambulatory Visit (INDEPENDENT_AMBULATORY_CARE_PROVIDER_SITE_OTHER): Payer: BC Managed Care – PPO

## 2011-06-27 DIAGNOSIS — J309 Allergic rhinitis, unspecified: Secondary | ICD-10-CM

## 2011-07-08 ENCOUNTER — Other Ambulatory Visit: Payer: Self-pay | Admitting: Internal Medicine

## 2011-07-17 ENCOUNTER — Ambulatory Visit (INDEPENDENT_AMBULATORY_CARE_PROVIDER_SITE_OTHER): Payer: BC Managed Care – PPO

## 2011-07-17 DIAGNOSIS — J309 Allergic rhinitis, unspecified: Secondary | ICD-10-CM

## 2011-07-28 ENCOUNTER — Ambulatory Visit (INDEPENDENT_AMBULATORY_CARE_PROVIDER_SITE_OTHER): Payer: BC Managed Care – PPO

## 2011-07-28 DIAGNOSIS — J309 Allergic rhinitis, unspecified: Secondary | ICD-10-CM

## 2011-08-15 ENCOUNTER — Ambulatory Visit (INDEPENDENT_AMBULATORY_CARE_PROVIDER_SITE_OTHER): Payer: BC Managed Care – PPO

## 2011-08-15 DIAGNOSIS — J309 Allergic rhinitis, unspecified: Secondary | ICD-10-CM

## 2011-08-25 ENCOUNTER — Ambulatory Visit (INDEPENDENT_AMBULATORY_CARE_PROVIDER_SITE_OTHER): Payer: BC Managed Care – PPO

## 2011-08-25 DIAGNOSIS — J309 Allergic rhinitis, unspecified: Secondary | ICD-10-CM

## 2011-08-29 ENCOUNTER — Telehealth: Payer: Self-pay | Admitting: Family Medicine

## 2011-08-29 NOTE — Telephone Encounter (Signed)
Refill request for below medications and this came from [facility].  Valsartan-HCTZ ? On mg Verapamil HCL ? On mg Pantoprazole ? On mg

## 2011-08-29 NOTE — Telephone Encounter (Signed)
NO refills without an OV. I have not seen her for 2 and 1/2 years

## 2011-08-30 ENCOUNTER — Other Ambulatory Visit: Payer: Self-pay | Admitting: Allergy

## 2011-08-30 MED ORDER — FLUTICASONE-SALMETEROL 250-50 MCG/DOSE IN AEPB: 1.0000 | INHALATION_SPRAY | Freq: Once | RESPIRATORY_TRACT | Status: DC

## 2011-08-31 NOTE — Telephone Encounter (Signed)
Please see below note

## 2011-08-31 NOTE — Telephone Encounter (Signed)
Left voice message, pt needs to call office back and schedule a office visit.

## 2011-08-31 NOTE — Telephone Encounter (Signed)
Pt called abck. Made appt for cpx and labs.

## 2011-09-01 ENCOUNTER — Other Ambulatory Visit (INDEPENDENT_AMBULATORY_CARE_PROVIDER_SITE_OTHER): Payer: BC Managed Care – PPO

## 2011-09-01 DIAGNOSIS — Z0000 Encounter for general adult medical examination without abnormal findings: Secondary | ICD-10-CM

## 2011-09-01 LAB — CBC WITH DIFFERENTIAL/PLATELET
Eosinophils Absolute: 0.1 10*3/uL (ref 0.0–0.7)
Eosinophils Relative: 1.3 % (ref 0.0–5.0)
HCT: 37.8 % (ref 36.0–46.0)
Lymphs Abs: 1.8 10*3/uL (ref 0.7–4.0)
Neutro Abs: 3 10*3/uL (ref 1.4–7.7)
Neutrophils Relative %: 57.6 % (ref 43.0–77.0)
WBC: 5.3 10*3/uL (ref 4.5–10.5)

## 2011-09-01 LAB — BASIC METABOLIC PANEL
BUN: 17 mg/dL (ref 6–23)
CO2: 29 mEq/L (ref 19–32)
Chloride: 105 mEq/L (ref 96–112)
Creatinine, Ser: 0.7 mg/dL (ref 0.4–1.2)
GFR: 99.18 mL/min (ref >60.00)
Glucose, Bld: 86 mg/dL (ref 70–99)
Potassium: 3.8 mEq/L (ref 3.5–5.1)

## 2011-09-01 LAB — LIPID PANEL
Cholesterol: 210 mg/dL — ABNORMAL HIGH (ref 0–200)
HDL: 54.8 mg/dL (ref >39.00)
Total CHOL/HDL Ratio: 4
Triglycerides: 84 mg/dL (ref 0.0–149.0)

## 2011-09-01 LAB — HEPATIC FUNCTION PANEL
ALT: 31 U/L (ref 0–35)
AST: 25 U/L (ref 0–37)
Alkaline Phosphatase: 67 U/L (ref 39–117)
Bilirubin, Direct: 0 mg/dL (ref 0.0–0.3)

## 2011-09-01 LAB — POCT URINALYSIS DIPSTICK
Bilirubin, UA: NEGATIVE
Leukocytes, UA: NEGATIVE
pH, UA: 8.5

## 2011-09-01 LAB — LDL CHOLESTEROL, DIRECT: Direct LDL: 143.2 mg/dL

## 2011-09-06 NOTE — Progress Notes (Signed)
Quick Note:  Left voice message, pt has CPE on 09/08/11. ______

## 2011-09-08 ENCOUNTER — Ambulatory Visit (INDEPENDENT_AMBULATORY_CARE_PROVIDER_SITE_OTHER): Payer: BC Managed Care – PPO

## 2011-09-08 ENCOUNTER — Ambulatory Visit (INDEPENDENT_AMBULATORY_CARE_PROVIDER_SITE_OTHER): Payer: BC Managed Care – PPO | Admitting: Family Medicine

## 2011-09-08 ENCOUNTER — Encounter: Payer: Self-pay | Admitting: Family Medicine

## 2011-09-08 VITALS — BP 110/72 | HR 83 | Temp 97.8°F | Ht 69.0 in | Wt 260.0 lb

## 2011-09-08 DIAGNOSIS — Z0000 Encounter for general adult medical examination without abnormal findings: Secondary | ICD-10-CM

## 2011-09-08 DIAGNOSIS — J309 Allergic rhinitis, unspecified: Secondary | ICD-10-CM

## 2011-09-08 NOTE — Progress Notes (Signed)
  Subjective:    Patient ID: Gabriela Shepherd, female    DOB: 1955-10-07, 56 y.o.   MRN: [id number]  HPI 56 yr old female for a cpx. She feels fine and has no concerns. She is watching her diet and works out at [employer] 2-3 days a week.    Review of Systems  Constitutional: Negative.   HENT: Negative.   Eyes: Negative.   Respiratory: Negative.   Cardiovascular: Negative.   Gastrointestinal: Negative.   Genitourinary: Negative for dysuria, urgency, frequency, hematuria, flank pain, decreased urine volume, enuresis, difficulty urinating, pelvic pain and dyspareunia.  Musculoskeletal: Negative.   Skin: Negative.   Neurological: Negative.   Hematological: Negative.   Psychiatric/Behavioral: Negative.        Objective:   Physical Exam  Constitutional: She is oriented to person, place, and time. She appears well-developed and well-nourished. No distress.  HENT:  Head: Normocephalic and atraumatic.  Right Ear: External ear normal.  Left Ear: External ear normal.  Nose: Nose normal.  Mouth/Throat: Oropharynx is clear and moist. No oropharyngeal exudate.  Eyes: Conjunctivae and EOM are normal. Pupils are equal, round, and reactive to light. No scleral icterus.  Neck: Normal range of motion. Neck supple. No JVD present. No thyromegaly present.  Cardiovascular: Normal rate, regular rhythm, normal heart sounds and intact distal pulses.  Exam reveals no gallop and no friction rub.   No murmur heard.      EKG normal   Pulmonary/Chest: Effort normal and breath sounds normal. No respiratory distress. She has no wheezes. She has no rales. She exhibits no tenderness.  Abdominal: Soft. Bowel sounds are normal. She exhibits no distension and no mass. There is no tenderness. There is no rebound and no guarding.  Musculoskeletal: Normal range of motion. She exhibits no edema and no tenderness.  Lymphadenopathy:    She has no cervical adenopathy.  Neurological: She is alert and oriented to person,  place, and time. She has normal reflexes. No cranial nerve deficit. She exhibits normal muscle tone. Coordination normal.  Skin: Skin is warm and dry. No rash noted. No erythema.  Psychiatric: She has a normal mood and affect. Her behavior is normal. Judgment and thought content normal.          Assessment & Plan:  Well exam.

## 2011-09-15 ENCOUNTER — Telehealth: Payer: Self-pay | Admitting: Internal Medicine

## 2011-09-15 ENCOUNTER — Ambulatory Visit (INDEPENDENT_AMBULATORY_CARE_PROVIDER_SITE_OTHER): Payer: BC Managed Care – PPO

## 2011-09-15 ENCOUNTER — Telehealth: Payer: Self-pay | Admitting: Family Medicine

## 2011-09-15 DIAGNOSIS — J309 Allergic rhinitis, unspecified: Secondary | ICD-10-CM

## 2011-09-15 MED ORDER — FLUTICASONE-SALMETEROL 250-50 MCG/DOSE IN AEPB: 1.0000 | INHALATION_SPRAY | Freq: Once | RESPIRATORY_TRACT | Status: DC

## 2011-09-15 NOTE — Telephone Encounter (Signed)
Pt called and said that she was suppose to get refills for pantoprazole (PROTONIX) 40 MG tablet, valsartan-hydrochlorothiazide (DIOVAN-HCT) 160-25 MG per tablet,verapamil (CALAN-SR) 120 MG CR tablet, sent in to [facility] ([facility]) the phone # [phone] and fax # [phone].  Pt only has one wks worth of med lft. Pt is wondering if she could get samples, since it will take at 5 to 10 day to process orders before mail order arrives. Pls order today and call pt and let her know if she can get samples.

## 2011-09-15 NOTE — Telephone Encounter (Signed)
rx printed and put on CY's cart for him to sign so we can fax to [facility].

## 2011-09-15 NOTE — Telephone Encounter (Signed)
RX signed and faxed back to the pharmacy.

## 2011-09-18 NOTE — Telephone Encounter (Signed)
I checked and we do not have any of these samples. I called pt and left a voice message.

## 2011-09-29 ENCOUNTER — Telehealth: Payer: Self-pay | Admitting: Family Medicine

## 2011-09-29 ENCOUNTER — Ambulatory Visit (INDEPENDENT_AMBULATORY_CARE_PROVIDER_SITE_OTHER): Payer: BC Managed Care – PPO

## 2011-09-29 DIAGNOSIS — J309 Allergic rhinitis, unspecified: Secondary | ICD-10-CM

## 2011-09-29 NOTE — Telephone Encounter (Signed)
Patient called stating that her rxs were supposed to be called into [facility] fax [phone]. Patient states she is completely out Ph. [phone]. Please assist. Patient also request that something be called in locally until she can received her meds.

## 2011-10-02 ENCOUNTER — Other Ambulatory Visit: Payer: Self-pay | Admitting: *Deleted

## 2011-10-02 MED ORDER — VALSARTAN-HYDROCHLOROTHIAZIDE 160-25 MG PO TABS: 1.0000 | ORAL_TABLET | Freq: Every day | ORAL | Status: DC

## 2011-10-02 MED ORDER — PANTOPRAZOLE SODIUM 40 MG PO TBEC: 40.0000 mg | DELAYED_RELEASE_TABLET | Freq: Every day | ORAL | Status: DC

## 2011-10-02 MED ORDER — VERAPAMIL HCL ER 120 MG PO CP24: 120.0000 mg | ORAL_CAPSULE | Freq: Every day | ORAL | Status: DC

## 2011-10-02 NOTE — Telephone Encounter (Signed)
Please refill all these  

## 2011-10-02 NOTE — Telephone Encounter (Addendum)
Pt called back again and said that her meds have yet to be sent to [facility]. Pt is completely out of med. [facility] [address] [facility] Pt said that she originally over a month ago. Pls call in today.  balsalazide (COLAZAL) 750 MG capsule, pantoprazole (PROTONIX) 40 MG tablet,  verapamil (VERELAN PM) 120 MG 24 hr capsule    [facility] phone # Ph. [phone] AND fax [phone]

## 2011-10-03 NOTE — Telephone Encounter (Signed)
Sent in electronically .  

## 2011-10-04 NOTE — Telephone Encounter (Signed)
Looks like scripts were sent in locally. I left voice message for pt to call back, if she needed any other medication.

## 2011-10-05 ENCOUNTER — Telehealth: Payer: Self-pay | Admitting: Family Medicine

## 2011-10-05 MED ORDER — VERAPAMIL HCL 120 MG PO TABS: 120.0000 mg | ORAL_TABLET | Freq: Every day | ORAL | Status: DC

## 2011-10-05 NOTE — Telephone Encounter (Signed)
Script was sent e-scribe and approved by Dr. Clent Ridges.

## 2011-10-13 ENCOUNTER — Ambulatory Visit (INDEPENDENT_AMBULATORY_CARE_PROVIDER_SITE_OTHER): Payer: BC Managed Care – PPO

## 2011-10-13 DIAGNOSIS — J309 Allergic rhinitis, unspecified: Secondary | ICD-10-CM

## 2011-10-27 ENCOUNTER — Ambulatory Visit (INDEPENDENT_AMBULATORY_CARE_PROVIDER_SITE_OTHER): Payer: BC Managed Care – PPO

## 2011-10-27 DIAGNOSIS — J309 Allergic rhinitis, unspecified: Secondary | ICD-10-CM

## 2011-11-03 ENCOUNTER — Ambulatory Visit (INDEPENDENT_AMBULATORY_CARE_PROVIDER_SITE_OTHER): Payer: BC Managed Care – PPO | Admitting: Family Medicine

## 2011-11-03 ENCOUNTER — Encounter: Payer: Self-pay | Admitting: Family Medicine

## 2011-11-03 VITALS — BP 122/74 | HR 95 | Temp 98.4°F | Wt 260.0 lb

## 2011-11-03 DIAGNOSIS — I10 Essential (primary) hypertension: Secondary | ICD-10-CM

## 2011-11-03 DIAGNOSIS — B351 Tinea unguium: Secondary | ICD-10-CM

## 2011-11-03 MED ORDER — TERBINAFINE HCL 250 MG PO TABS: 250.0000 mg | ORAL_TABLET | Freq: Every day | ORAL | Status: DC

## 2011-11-03 NOTE — Progress Notes (Signed)
  Subjective:    Patient ID: Gabriela Shepherd, female    DOB: 04-24-1956, 56 y.o.   MRN: [id number]  HPI Here to check her BP and also for infected toenails. Her toenails have been thick and yellow for several years, but lately they have started to dig into her toes when she walks. OTC topical solutions do not help.    Review of Systems  Constitutional: Negative.   Respiratory: Negative.   Cardiovascular: Negative.        Objective:   Physical Exam  Constitutional: She appears well-developed and well-nourished.  Cardiovascular: Normal rate, regular rhythm, normal heart sounds and intact distal pulses.   Pulmonary/Chest: Effort normal and breath sounds normal.  Lymphadenopathy:    She has no cervical adenopathy.  Skin:       Multiple toenails are thickened and flaking          Assessment & Plan:  HTN is stable. Treat the nails with Lamisil. Her liver panel was normal in February.

## 2011-11-07 ENCOUNTER — Other Ambulatory Visit: Payer: Self-pay | Admitting: Certified Nurse Midwife

## 2011-11-07 DIAGNOSIS — Z1231 Encounter for screening mammogram for malignant neoplasm of breast: Secondary | ICD-10-CM

## 2011-11-10 ENCOUNTER — Ambulatory Visit (INDEPENDENT_AMBULATORY_CARE_PROVIDER_SITE_OTHER): Payer: BC Managed Care – PPO

## 2011-11-10 DIAGNOSIS — J309 Allergic rhinitis, unspecified: Secondary | ICD-10-CM

## 2011-11-14 ENCOUNTER — Ambulatory Visit: Payer: BC Managed Care – PPO | Admitting: Internal Medicine

## 2011-11-24 ENCOUNTER — Encounter: Payer: Self-pay | Admitting: Internal Medicine

## 2011-11-24 ENCOUNTER — Ambulatory Visit (INDEPENDENT_AMBULATORY_CARE_PROVIDER_SITE_OTHER): Payer: BC Managed Care – PPO

## 2011-11-24 DIAGNOSIS — J309 Allergic rhinitis, unspecified: Secondary | ICD-10-CM

## 2011-12-05 ENCOUNTER — Ambulatory Visit (HOSPITAL_COMMUNITY): Payer: BC Managed Care – PPO

## 2011-12-08 ENCOUNTER — Ambulatory Visit (INDEPENDENT_AMBULATORY_CARE_PROVIDER_SITE_OTHER): Payer: BC Managed Care – PPO

## 2011-12-08 DIAGNOSIS — J309 Allergic rhinitis, unspecified: Secondary | ICD-10-CM

## 2011-12-15 ENCOUNTER — Encounter: Payer: Self-pay | Admitting: Internal Medicine

## 2011-12-15 ENCOUNTER — Ambulatory Visit (INDEPENDENT_AMBULATORY_CARE_PROVIDER_SITE_OTHER): Payer: BC Managed Care – PPO | Admitting: Internal Medicine

## 2011-12-15 ENCOUNTER — Ambulatory Visit (INDEPENDENT_AMBULATORY_CARE_PROVIDER_SITE_OTHER): Payer: BC Managed Care – PPO

## 2011-12-15 VITALS — BP 110/68 | HR 108 | Ht 67.0 in | Wt 261.8 lb

## 2011-12-15 DIAGNOSIS — J301 Allergic rhinitis due to pollen: Secondary | ICD-10-CM

## 2011-12-15 DIAGNOSIS — J45909 Unspecified asthma, uncomplicated: Secondary | ICD-10-CM

## 2011-12-15 DIAGNOSIS — J45998 Other asthma: Secondary | ICD-10-CM

## 2011-12-15 DIAGNOSIS — J309 Allergic rhinitis, unspecified: Secondary | ICD-10-CM

## 2011-12-15 MED ORDER — ALBUTEROL SULFATE HFA 108 (90 BASE) MCG/ACT IN AERS: INHALATION_SPRAY | RESPIRATORY_TRACT | Status: DC

## 2011-12-15 NOTE — Patient Instructions (Signed)
Continue present treatment  Please call as needed 

## 2011-12-15 NOTE — Progress Notes (Signed)
  Subjective:    Patient ID: Gabriela Shepherd, female    DOB: [birth year], 56 y.o.   MRN: [id number]  HPI 77 yoF followed for allergic rhinitis and asthma. Last here November 16, 2009 reporting a good year at that time.  Continues allergy vaccine 1:10 here and thinks the shots help "I can breathe". Continues Advair. Went months not needing Proair, but used it about 5 times in past month. One episode after strong fragrance exposure. No bad episodes. Denies any nasal symptoms- not needing antihistamines.   12/15/11- 55 yoF followed for allergic rhinitis and asthma. Last here November 16, 2009 reporting a good year at that time.  No troubles with breathing or allergies at this time; still on allergy vaccine Has done well this spring. She is satisfied that allergy vaccine helps her with no problems. Has not needed antihistamines. Denies any wheezing as long as she continues Advair with only occasional use of her rescue inhaler.  ROS-see HPI Constitutional:   No-   weight loss, night sweats, fevers, chills, fatigue, lassitude. HEENT:   No-  headaches, difficulty swallowing, tooth/dental problems, sore throat,       No-  sneezing, itching, ear ache, nasal congestion, post nasal drip,  CV:  No-   chest pain, orthopnea, PND, swelling in lower extremities, anasarca,   dizziness, palpitations Resp: No-   shortness of breath with exertion or at rest.              No-   productive cough,  No non-productive cough,  No- coughing up of blood.              No-   change in color of mucus.  Little wheezing.   Skin: No-   rash or lesions. GI:  No-   heartburn, indigestion, abdominal pain, nausea, vomiting,  GU:  MS:  No-   joint pain or swelling.  . Neuro-     nothing unusual Psych:  No- change in mood or affect. No depression or anxiety.  No memory loss.  OBJ- Physical Exam General- Alert, Oriented, Affect-appropriate, Distress- none acute, overweight Skin- rash-none, lesions- none, excoriation-  none Lymphadenopathy- none Head- atraumatic            Eyes- Gross vision intact, PERRLA, conjunctivae and secretions clear            Ears- Hearing, canals-normal            Nose- Clear, no-Septal dev, mucus, polyps, erosion, perforation             Throat- Mallampati II , mucosa clear , drainage- none, tonsils- atrophic Neck- flexible , trachea midline, no stridor , thyroid nl, carotid no bruit Chest - symmetrical excursion , unlabored           Heart/CV- RRR , no murmur , no gallop  , no rub, nl s1 s2                           - JVD- none , edema- none, stasis changes- none, varices- none           Lung- clear to P&A, wheeze- none, cough- none , dullness-none, rub- none           Chest wall-  Abd-  Br/ Gen/ Rectal- Not done, not indicated Extrem- cyanosis- none, clubbing, none, atrophy- none, strength- nl Neuro- grossly intact to observation

## 2011-12-21 NOTE — Assessment & Plan Note (Signed)
Mild intermittent asthma at this point. Plan-refill rescue inhaler. Try reducing Advair to 100 with sample

## 2011-12-21 NOTE — Assessment & Plan Note (Signed)
Allergy vaccine has been effective and well- tolerated. She will continue.

## 2012-01-03 ENCOUNTER — Ambulatory Visit (INDEPENDENT_AMBULATORY_CARE_PROVIDER_SITE_OTHER): Payer: BC Managed Care – PPO

## 2012-01-03 DIAGNOSIS — J309 Allergic rhinitis, unspecified: Secondary | ICD-10-CM

## 2012-01-11 ENCOUNTER — Ambulatory Visit (INDEPENDENT_AMBULATORY_CARE_PROVIDER_SITE_OTHER): Payer: BC Managed Care – PPO

## 2012-01-11 DIAGNOSIS — J309 Allergic rhinitis, unspecified: Secondary | ICD-10-CM

## 2012-01-16 ENCOUNTER — Ambulatory Visit (HOSPITAL_COMMUNITY)
Admission: RE | Admit: 2012-01-16 | Discharge: 2012-01-16 | Disposition: A | Discharge status: Home / Self Care | Payer: BC Managed Care – PPO | Source: Ambulatory Visit | Attending: Certified Nurse Midwife | Admitting: Certified Nurse Midwife

## 2012-01-16 DIAGNOSIS — Z1231 Encounter for screening mammogram for malignant neoplasm of breast: Secondary | ICD-10-CM | POA: Insufficient documentation

## 2012-01-22 ENCOUNTER — Ambulatory Visit (INDEPENDENT_AMBULATORY_CARE_PROVIDER_SITE_OTHER): Payer: BC Managed Care – PPO

## 2012-01-22 DIAGNOSIS — J309 Allergic rhinitis, unspecified: Secondary | ICD-10-CM

## 2012-01-23 ENCOUNTER — Ambulatory Visit (INDEPENDENT_AMBULATORY_CARE_PROVIDER_SITE_OTHER): Payer: BC Managed Care – PPO

## 2012-01-23 ENCOUNTER — Ambulatory Visit (INDEPENDENT_AMBULATORY_CARE_PROVIDER_SITE_OTHER): Payer: BC Managed Care – PPO | Admitting: Family Medicine

## 2012-01-23 VITALS — BP 110/60 | Temp 97.6°F | Wt 260.0 lb

## 2012-01-23 DIAGNOSIS — J019 Acute sinusitis, unspecified: Secondary | ICD-10-CM

## 2012-01-23 DIAGNOSIS — J309 Allergic rhinitis, unspecified: Secondary | ICD-10-CM

## 2012-01-23 MED ORDER — AZITHROMYCIN 250 MG PO TABS: ORAL_TABLET | ORAL | Status: AC

## 2012-01-23 NOTE — Patient Instructions (Addendum)

## 2012-01-23 NOTE — Progress Notes (Signed)
  Subjective:    Patient ID: Gabriela Shepherd, female    DOB: 1955/09/25, 56 y.o.   MRN: [id number]  HPI  Three-week history of sinus congestion and cough. Started with sore throat. Subsequently developed some intermittent headaches and colored nasal discharge which is brownish and occasionally reddish. She tried over-the-counter medications without much improvement. Hydrating well. Cough is nonproductive. She has history of reactive airway disease treated with Advair. No recent wheezing. No dyspnea. Symptoms seem to be getting worse.  Patient is nonsmoker   Review of Systems  Constitutional: Negative for fever and chills.  HENT: Positive for congestion and sinus pressure. Negative for sore throat.   Respiratory: Positive for cough.        Objective:   Physical Exam  Constitutional: She appears well-developed and well-nourished.  HENT:  Right Ear: External ear normal.  Left Ear: External ear normal.  Mouth/Throat: Oropharynx is clear and moist.  Neck: Neck supple.  Cardiovascular: Normal rate and regular rhythm.   Pulmonary/Chest: Effort normal and breath sounds normal. No respiratory distress. She has no wheezes. She has no rales.  Lymphadenopathy:    She has no cervical adenopathy.          Assessment & Plan:  Acute sinusitis. Penicillin allergic. Zithromax for 5 days

## 2012-02-02 ENCOUNTER — Ambulatory Visit (INDEPENDENT_AMBULATORY_CARE_PROVIDER_SITE_OTHER): Payer: BC Managed Care – PPO

## 2012-02-02 DIAGNOSIS — J309 Allergic rhinitis, unspecified: Secondary | ICD-10-CM

## 2012-02-16 ENCOUNTER — Ambulatory Visit (INDEPENDENT_AMBULATORY_CARE_PROVIDER_SITE_OTHER): Payer: BC Managed Care – PPO

## 2012-02-16 DIAGNOSIS — J309 Allergic rhinitis, unspecified: Secondary | ICD-10-CM

## 2012-03-01 ENCOUNTER — Ambulatory Visit (INDEPENDENT_AMBULATORY_CARE_PROVIDER_SITE_OTHER): Payer: BC Managed Care – PPO

## 2012-03-01 DIAGNOSIS — J309 Allergic rhinitis, unspecified: Secondary | ICD-10-CM

## 2012-03-04 ENCOUNTER — Other Ambulatory Visit: Payer: Self-pay

## 2012-03-04 MED ORDER — VERAPAMIL HCL 120 MG PO TABS: 120.0000 mg | ORAL_TABLET | Freq: Every day | ORAL | Status: DC

## 2012-03-08 ENCOUNTER — Ambulatory Visit (INDEPENDENT_AMBULATORY_CARE_PROVIDER_SITE_OTHER): Payer: BC Managed Care – PPO

## 2012-03-08 DIAGNOSIS — J309 Allergic rhinitis, unspecified: Secondary | ICD-10-CM

## 2012-03-15 ENCOUNTER — Ambulatory Visit: Payer: BC Managed Care – PPO

## 2012-03-15 ENCOUNTER — Ambulatory Visit (INDEPENDENT_AMBULATORY_CARE_PROVIDER_SITE_OTHER): Payer: BC Managed Care – PPO

## 2012-03-15 DIAGNOSIS — J309 Allergic rhinitis, unspecified: Secondary | ICD-10-CM

## 2012-03-29 ENCOUNTER — Ambulatory Visit (INDEPENDENT_AMBULATORY_CARE_PROVIDER_SITE_OTHER): Payer: BC Managed Care – PPO

## 2012-03-29 DIAGNOSIS — J309 Allergic rhinitis, unspecified: Secondary | ICD-10-CM

## 2012-04-11 ENCOUNTER — Ambulatory Visit (INDEPENDENT_AMBULATORY_CARE_PROVIDER_SITE_OTHER): Payer: BC Managed Care – PPO

## 2012-04-11 DIAGNOSIS — J309 Allergic rhinitis, unspecified: Secondary | ICD-10-CM

## 2012-04-15 ENCOUNTER — Encounter: Payer: Self-pay | Admitting: Internal Medicine

## 2012-04-19 ENCOUNTER — Ambulatory Visit (INDEPENDENT_AMBULATORY_CARE_PROVIDER_SITE_OTHER): Payer: BC Managed Care – PPO

## 2012-04-19 DIAGNOSIS — J309 Allergic rhinitis, unspecified: Secondary | ICD-10-CM

## 2012-05-03 ENCOUNTER — Ambulatory Visit (INDEPENDENT_AMBULATORY_CARE_PROVIDER_SITE_OTHER): Payer: BC Managed Care – PPO

## 2012-05-03 DIAGNOSIS — J309 Allergic rhinitis, unspecified: Secondary | ICD-10-CM

## 2012-05-21 ENCOUNTER — Ambulatory Visit (INDEPENDENT_AMBULATORY_CARE_PROVIDER_SITE_OTHER): Payer: BC Managed Care – PPO

## 2012-05-21 DIAGNOSIS — J309 Allergic rhinitis, unspecified: Secondary | ICD-10-CM

## 2012-05-30 ENCOUNTER — Ambulatory Visit (INDEPENDENT_AMBULATORY_CARE_PROVIDER_SITE_OTHER): Payer: BC Managed Care – PPO

## 2012-05-30 DIAGNOSIS — J309 Allergic rhinitis, unspecified: Secondary | ICD-10-CM

## 2012-06-06 ENCOUNTER — Ambulatory Visit (INDEPENDENT_AMBULATORY_CARE_PROVIDER_SITE_OTHER): Payer: BC Managed Care – PPO

## 2012-06-06 DIAGNOSIS — J309 Allergic rhinitis, unspecified: Secondary | ICD-10-CM

## 2012-06-13 ENCOUNTER — Ambulatory Visit (INDEPENDENT_AMBULATORY_CARE_PROVIDER_SITE_OTHER): Payer: BC Managed Care – PPO

## 2012-06-13 DIAGNOSIS — J309 Allergic rhinitis, unspecified: Secondary | ICD-10-CM

## 2012-06-27 ENCOUNTER — Ambulatory Visit (INDEPENDENT_AMBULATORY_CARE_PROVIDER_SITE_OTHER): Payer: BC Managed Care – PPO | Admitting: Family Medicine

## 2012-06-27 ENCOUNTER — Ambulatory Visit (INDEPENDENT_AMBULATORY_CARE_PROVIDER_SITE_OTHER): Payer: BC Managed Care – PPO

## 2012-06-27 ENCOUNTER — Encounter: Payer: Self-pay | Admitting: Family Medicine

## 2012-06-27 VITALS — BP 140/90 | HR 101 | Temp 97.8°F | Wt 258.0 lb

## 2012-06-27 DIAGNOSIS — J309 Allergic rhinitis, unspecified: Secondary | ICD-10-CM

## 2012-06-27 DIAGNOSIS — M79673 Pain in unspecified foot: Secondary | ICD-10-CM

## 2012-06-27 DIAGNOSIS — M79609 Pain in unspecified limb: Secondary | ICD-10-CM

## 2012-06-27 DIAGNOSIS — M79646 Pain in unspecified finger(s): Secondary | ICD-10-CM

## 2012-06-27 NOTE — Progress Notes (Signed)
  Subjective:    Patient ID: Gabriela Shepherd, female    DOB: April 01, 1956, 56 y.o.   MRN: [id number]  HPI Here for 2 issues, both of which have been present for about one month. No recent trauma. She has had swelling and pain at the base of the right thumb. She has also had swelling and burning pains in the left foot, especially around the great and 2nd toes. No numbness.    Review of Systems  Constitutional: Negative.   Musculoskeletal: Positive for joint swelling and arthralgias.       Objective:   Physical Exam  Constitutional: She appears well-developed and well-nourished.  Musculoskeletal:       The right thumb is a little swollen and it is very tender over the MCP joint. No erythema or warmth. The left forefoot is tender between the 1st and 2nd metatarsals.           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 Refer to Orthopedics and [facility] [facility]

## 2012-07-09 ENCOUNTER — Ambulatory Visit (INDEPENDENT_AMBULATORY_CARE_PROVIDER_SITE_OTHER): Payer: BC Managed Care – PPO

## 2012-07-09 DIAGNOSIS — J309 Allergic rhinitis, unspecified: Secondary | ICD-10-CM

## 2012-07-22 ENCOUNTER — Ambulatory Visit (INDEPENDENT_AMBULATORY_CARE_PROVIDER_SITE_OTHER): Payer: BC Managed Care – PPO

## 2012-07-22 DIAGNOSIS — J309 Allergic rhinitis, unspecified: Secondary | ICD-10-CM

## 2012-07-30 ENCOUNTER — Ambulatory Visit (INDEPENDENT_AMBULATORY_CARE_PROVIDER_SITE_OTHER): Payer: BC Managed Care – PPO

## 2012-07-30 DIAGNOSIS — J309 Allergic rhinitis, unspecified: Secondary | ICD-10-CM

## 2012-08-06 ENCOUNTER — Ambulatory Visit (INDEPENDENT_AMBULATORY_CARE_PROVIDER_SITE_OTHER): Payer: BC Managed Care – PPO

## 2012-08-06 DIAGNOSIS — J309 Allergic rhinitis, unspecified: Secondary | ICD-10-CM

## 2012-08-07 ENCOUNTER — Ambulatory Visit (INDEPENDENT_AMBULATORY_CARE_PROVIDER_SITE_OTHER): Payer: BC Managed Care – PPO

## 2012-08-07 DIAGNOSIS — J309 Allergic rhinitis, unspecified: Secondary | ICD-10-CM

## 2012-08-13 ENCOUNTER — Ambulatory Visit: Payer: BC Managed Care – PPO

## 2012-08-16 ENCOUNTER — Ambulatory Visit (INDEPENDENT_AMBULATORY_CARE_PROVIDER_SITE_OTHER): Payer: BC Managed Care – PPO

## 2012-08-16 DIAGNOSIS — J309 Allergic rhinitis, unspecified: Secondary | ICD-10-CM

## 2012-08-20 ENCOUNTER — Ambulatory Visit: Payer: BC Managed Care – PPO

## 2012-08-20 ENCOUNTER — Ambulatory Visit (INDEPENDENT_AMBULATORY_CARE_PROVIDER_SITE_OTHER): Payer: BC Managed Care – PPO

## 2012-08-20 DIAGNOSIS — J309 Allergic rhinitis, unspecified: Secondary | ICD-10-CM

## 2012-08-27 ENCOUNTER — Ambulatory Visit: Payer: BC Managed Care – PPO

## 2012-09-03 ENCOUNTER — Ambulatory Visit: Payer: BC Managed Care – PPO

## 2012-09-03 ENCOUNTER — Ambulatory Visit (INDEPENDENT_AMBULATORY_CARE_PROVIDER_SITE_OTHER): Payer: BC Managed Care – PPO

## 2012-09-03 DIAGNOSIS — J309 Allergic rhinitis, unspecified: Secondary | ICD-10-CM

## 2012-09-04 ENCOUNTER — Encounter: Payer: Self-pay | Admitting: Internal Medicine

## 2012-09-10 ENCOUNTER — Ambulatory Visit (INDEPENDENT_AMBULATORY_CARE_PROVIDER_SITE_OTHER): Payer: BC Managed Care – PPO

## 2012-09-10 ENCOUNTER — Ambulatory Visit: Payer: BC Managed Care – PPO

## 2012-09-10 DIAGNOSIS — J309 Allergic rhinitis, unspecified: Secondary | ICD-10-CM

## 2012-09-17 ENCOUNTER — Ambulatory Visit (INDEPENDENT_AMBULATORY_CARE_PROVIDER_SITE_OTHER): Payer: BC Managed Care – PPO

## 2012-09-17 DIAGNOSIS — J309 Allergic rhinitis, unspecified: Secondary | ICD-10-CM

## 2012-09-24 ENCOUNTER — Ambulatory Visit: Payer: BC Managed Care – PPO

## 2012-10-04 ENCOUNTER — Ambulatory Visit (INDEPENDENT_AMBULATORY_CARE_PROVIDER_SITE_OTHER): Payer: BC Managed Care – PPO

## 2012-10-04 DIAGNOSIS — J309 Allergic rhinitis, unspecified: Secondary | ICD-10-CM

## 2012-10-08 ENCOUNTER — Ambulatory Visit: Payer: BC Managed Care – PPO

## 2012-10-11 ENCOUNTER — Ambulatory Visit (INDEPENDENT_AMBULATORY_CARE_PROVIDER_SITE_OTHER): Payer: BC Managed Care – PPO

## 2012-10-11 DIAGNOSIS — J309 Allergic rhinitis, unspecified: Secondary | ICD-10-CM

## 2012-10-14 ENCOUNTER — Telehealth: Payer: Self-pay | Admitting: Family Medicine

## 2012-10-14 MED ORDER — VALSARTAN-HYDROCHLOROTHIAZIDE 160-25 MG PO TABS: 1.0000 | ORAL_TABLET | Freq: Every day | ORAL | Status: DC

## 2012-10-14 MED ORDER — VERAPAMIL HCL 120 MG PO TABS: 120.0000 mg | ORAL_TABLET | Freq: Every day | ORAL | Status: DC

## 2012-10-14 MED ORDER — PANTOPRAZOLE SODIUM 40 MG PO TBEC: 40.0000 mg | DELAYED_RELEASE_TABLET | Freq: Every day | ORAL | Status: DC

## 2012-10-14 NOTE — Telephone Encounter (Signed)
Refill request for Valsartan-HCTZ, Pantoprazole, & Verapamil ( send to [facility] ) and I did send all scripts e-scribe

## 2012-10-15 ENCOUNTER — Other Ambulatory Visit: Payer: Self-pay | Admitting: Internal Medicine

## 2012-10-15 ENCOUNTER — Ambulatory Visit (INDEPENDENT_AMBULATORY_CARE_PROVIDER_SITE_OTHER): Payer: BC Managed Care – PPO

## 2012-10-15 ENCOUNTER — Telehealth: Payer: Self-pay | Admitting: Internal Medicine

## 2012-10-15 DIAGNOSIS — J309 Allergic rhinitis, unspecified: Secondary | ICD-10-CM

## 2012-10-15 MED ORDER — FLUTICASONE-SALMETEROL 250-50 MCG/DOSE IN AEPB: 1.0000 | INHALATION_SPRAY | Freq: Two times a day (BID) | RESPIRATORY_TRACT | Status: DC

## 2012-10-15 MED ORDER — FLUTICASONE-SALMETEROL 250-50 MCG/DOSE IN AEPB: 1.0000 | INHALATION_SPRAY | Freq: Once | RESPIRATORY_TRACT | Status: DC

## 2012-10-15 NOTE — Telephone Encounter (Signed)
RX was sent 10/14/12 but shows it was printed but will resend rx to [facility]. Pt is aware. Nothing further was needed

## 2012-10-18 ENCOUNTER — Ambulatory Visit: Payer: BC Managed Care – PPO

## 2012-10-22 ENCOUNTER — Ambulatory Visit: Payer: BC Managed Care – PPO

## 2012-10-29 ENCOUNTER — Ambulatory Visit (INDEPENDENT_AMBULATORY_CARE_PROVIDER_SITE_OTHER): Payer: BC Managed Care – PPO

## 2012-10-29 DIAGNOSIS — J309 Allergic rhinitis, unspecified: Secondary | ICD-10-CM

## 2012-11-05 ENCOUNTER — Ambulatory Visit (INDEPENDENT_AMBULATORY_CARE_PROVIDER_SITE_OTHER): Payer: BC Managed Care – PPO

## 2012-11-05 DIAGNOSIS — J309 Allergic rhinitis, unspecified: Secondary | ICD-10-CM

## 2012-11-12 ENCOUNTER — Ambulatory Visit (INDEPENDENT_AMBULATORY_CARE_PROVIDER_SITE_OTHER): Payer: BC Managed Care – PPO

## 2012-11-12 ENCOUNTER — Ambulatory Visit: Payer: Self-pay | Admitting: Certified Nurse Midwife

## 2012-11-12 DIAGNOSIS — J309 Allergic rhinitis, unspecified: Secondary | ICD-10-CM

## 2012-11-26 ENCOUNTER — Ambulatory Visit: Payer: Self-pay | Admitting: Certified Nurse Midwife

## 2012-11-29 ENCOUNTER — Ambulatory Visit (INDEPENDENT_AMBULATORY_CARE_PROVIDER_SITE_OTHER): Payer: BC Managed Care – PPO

## 2012-11-29 DIAGNOSIS — J309 Allergic rhinitis, unspecified: Secondary | ICD-10-CM

## 2012-12-06 ENCOUNTER — Ambulatory Visit (INDEPENDENT_AMBULATORY_CARE_PROVIDER_SITE_OTHER): Payer: BC Managed Care – PPO

## 2012-12-06 DIAGNOSIS — J309 Allergic rhinitis, unspecified: Secondary | ICD-10-CM

## 2012-12-10 ENCOUNTER — Ambulatory Visit: Payer: BC Managed Care – PPO

## 2012-12-10 ENCOUNTER — Encounter: Payer: Self-pay | Admitting: Certified Nurse Midwife

## 2012-12-12 ENCOUNTER — Encounter: Payer: Self-pay | Admitting: Certified Nurse Midwife

## 2012-12-12 ENCOUNTER — Ambulatory Visit (INDEPENDENT_AMBULATORY_CARE_PROVIDER_SITE_OTHER): Payer: BC Managed Care – PPO | Admitting: Certified Nurse Midwife

## 2012-12-12 VITALS — BP 118/62 | Ht 67.0 in | Wt 259.0 lb

## 2012-12-12 DIAGNOSIS — Z01419 Encounter for gynecological examination (general) (routine) without abnormal findings: Secondary | ICD-10-CM

## 2012-12-12 NOTE — Progress Notes (Signed)
[name] here for annual exam. Menopausal no HRT. Denies vaginal bleeding or vaginal dryness.  Denies hot flashes or night sweats. No health issues.See PCP for aex for labs and medication management. Hypertension stable on medication. No health issues today.   Patient's last menstrual period was 11/21/2009.          Sexually active: no  The current method of family planning is tubal ligation.    Exercising: yes  weights, leg exercises Smoker:  no  Health Maintenance: Pap:  11-07-11 neg HPV HR neg MMG:  01-16-12 normal Colonoscopy:  11-27-08 f/u 40yrs BMD:   none TDaP:  2009 Labs: none Self breast exams: occ   reports that she has never smoked. She has never used smokeless tobacco. She reports that she does not drink alcohol or use illicit drugs.  Past Medical History  Diagnosis Date  . Anxiety   . Asthma     sees Dr. Maple Hudson  . GERD (gastroesophageal reflux disease)   . Hypertension   . Allergic rhinitis     sees Dr. Maple Hudson  . Headache   . Osteoarthritis   . Carpal tunnel syndrome     sees Dr. Amanda Pea  . Gynecological examination     sees Dr. Meredeth Ide    Past Surgical History  Procedure Laterality Date  . Bunionectomy    . Colonoscopy  11-27-08    per Dr. Juanda Chance, hyperplastic polyps, repeat in 7 yrs   . Hysteroscopic polyp resection      Current Outpatient Prescriptions  Medication Sig Dispense Refill  . albuterol (PROAIR HFA) 108 (90 BASE) MCG/ACT inhaler 2 puffs every 4 hours as needed, rescue bronchodilator  1 Inhaler  prn  . Fluticasone-Salmeterol (ADVAIR DISKUS) 250-50 MCG/DOSE AEPB Inhale 1 puff into the lungs 2 (two) times daily.  180 each  0  . Multiple Vitamin (MULTIVITAMIN) tablet Take 1 tablet by mouth daily.      . pantoprazole (PROTONIX) 40 MG tablet Take 1 tablet (40 mg total) by mouth daily.  90 tablet  0  . UNABLE TO FIND Allergy injection      . valsartan-hydrochlorothiazide (DIOVAN HCT) 160-25 MG per tablet Take 1 tablet by  mouth daily.  90 tablet  0  . verapamil (CALAN) 120 MG tablet Take 1 tablet (120 mg total) by mouth daily.  90 tablet  0   No current facility-administered medications for this visit.    Family History  Problem Relation Age of Onset  . Arthritis    . Coronary artery disease      Female 1st degree relative; <50 Father and uncle died early of MI  . Diabetes      1st degree relative  . Hypertension    . Stroke      M 1st degree relative <50  . Liver cancer Mother     deceased   . Diabetes Father   . Coronary artery disease Father   . Diabetes Maternal Grandmother     ROS:  Pertinent items are noted in HPI.  Otherwise, a comprehensive ROS was negative.  Exam:   BP 118/62  Ht 5\' 7"  (1.702 m)  Wt 259 lb (117.482 kg)  BMI 40.56 kg/m2  LMP 11/21/2009 Height: 5\' 7"  (170.2 cm)  Ht Readings from Last 3 Encounters:  12/12/12 5\' 7"  (1.702 m)  12/15/11 5\' 7"  (1.702 m)  09/08/11 5\' 9"  (1.753 m)    General appearance: alert, cooperative and appears stated age Head: Normocephalic, without  obvious abnormality, atraumatic Neck: no adenopathy, supple, symmetrical, trachea midline and thyroid normal to inspection and palpation Lungs: clear to auscultation bilaterally Breasts: normal appearance, no masses or tenderness, No nipple retraction or dimpling, No nipple discharge or bleeding, No axillary or supraclavicular adenopathy Heart: regular rate and rhythm Abdomen: soft, non-tender; no masses,  no organomegaly Extremities: extremities normal, atraumatic, no cyanosis or edema Skin: Skin color, texture, turgor normal. No rashes or lesions Lymph nodes: Cervical, supraclavicular, and axillary nodes normal. No abnormal inguinal nodes palpated Neurologic: Grossly normal   Pelvic: External genitalia:  no lesions              Urethra:  normal appearing urethra with no masses, tenderness or lesions              Bartholin's and Skene's: normal                 Vagina: normal appearing vagina with  normal color and discharge, no lesions              Cervix: normal, non tender              Pap taken: no Bimanual Exam:  Uterus:  normal size, contour, position, consistency, mobility, non-tender and mid position              Adnexa: normal adnexa and no mass, fullness, tenderness               Rectovaginal: Confirms               Anus:  normal sphincter tone, no lesions  A:  Well Woman with normal exam  Menopausal no HRT  Hypertension stable medication with PCP  P: Reviewed health and wellness pertinent to exam     Pap smear as per guidelines   Mammogram yearly  pap smear not taken today  Aware of need for evaluation if vaginal bleeding Continue follow up with PCP as indicated counseled on mammography screening, feminine hygiene, adequate intake of calcium and vitamin D, diet and exercise, encouraged working on weight loss to decrease health risk, Kegel's exercises.  return annually or prn  An After Visit Summary was printed and given to the patient.  Reviewed, TL

## 2012-12-12 NOTE — Patient Instructions (Signed)

## 2012-12-17 ENCOUNTER — Ambulatory Visit (INDEPENDENT_AMBULATORY_CARE_PROVIDER_SITE_OTHER): Payer: BC Managed Care – PPO

## 2012-12-17 ENCOUNTER — Ambulatory Visit (INDEPENDENT_AMBULATORY_CARE_PROVIDER_SITE_OTHER): Payer: BC Managed Care – PPO | Admitting: Internal Medicine

## 2012-12-17 ENCOUNTER — Encounter: Payer: Self-pay | Admitting: Internal Medicine

## 2012-12-17 VITALS — BP 124/62 | HR 79 | Ht 67.0 in | Wt 262.8 lb

## 2012-12-17 DIAGNOSIS — J309 Allergic rhinitis, unspecified: Secondary | ICD-10-CM

## 2012-12-17 DIAGNOSIS — J45909 Unspecified asthma, uncomplicated: Secondary | ICD-10-CM

## 2012-12-17 DIAGNOSIS — J45998 Other asthma: Secondary | ICD-10-CM

## 2012-12-17 DIAGNOSIS — J301 Allergic rhinitis due to pollen: Secondary | ICD-10-CM

## 2012-12-17 NOTE — Patient Instructions (Addendum)
We can continue allergy vaccine 1:10 GH  Please call as needed 

## 2012-12-17 NOTE — Progress Notes (Signed)
Subjective:    Patient ID: Gabriela Shepherd, female    DOB: 1956-06-06, 57 y.o.   MRN: [id number]  HPI 78 yoF followed for allergic rhinitis and asthma. Last here November 16, 2009 reporting a good year at that time.  Continues allergy vaccine 1:10 here and thinks the shots help "I can breathe". Continues Advair. Went months not needing Proair, but used it about 5 times in past month. One episode after strong fragrance exposure. No bad episodes. Denies any nasal symptoms- not needing antihistamines.   12/15/11- 55 yoF followed for allergic rhinitis and asthma. Last here November 16, 2009 reporting a good year at that time.  No troubles with breathing or allergies at this time; still on allergy vaccine Has done well this spring. She is satisfied that allergy vaccine helps her with no problems. Has not needed antihistamines. Denies any wheezing as long as she continues Advair with only occasional use of her rescue inhaler.  12/17/12- 55 yoF followed for allergic rhinitis and asthma. Last here November 16, 2009 reporting a good year at that time.  FOLLOWS FOR: Still on allergy vaccine 1:10 GH; itchy, watery eyes at times.  She considers allergy vaccine control good most of the time. Does not need antihistamines. Intake season has a little more rhinorrhea and itching of eyes. Denies chest tightness or wheeze while continuing Advair. Rarely needs rescue inhaler.  ROS-see HPI Constitutional:   No-   weight loss, night sweats, fevers, chills, fatigue, lassitude. HEENT:   No-  headaches, difficulty swallowing, tooth/dental problems, sore throat,       No-  sneezing, +itching, ear ache, +nasal congestion, post nasal drip,  CV:  No-   chest pain, orthopnea, PND, swelling in lower extremities, anasarca,   dizziness, palpitations Resp: No-   shortness of breath with exertion or at rest.              No-   productive cough,  No non-productive cough,  No- coughing up of blood.              No-   change in color of  mucus.  +Little wheezing.   Skin: No-   rash or lesions. GI:  No-   heartburn, indigestion, abdominal pain, nausea, vomiting,  GU:  MS:  No-   joint pain or swelling.  . Neuro-     nothing unusual Psych:  No- change in mood or affect. No depression or anxiety.  No memory loss.  OBJ- Physical Exam General- Alert, Oriented, Affect-appropriate, Distress- none acute, overweight Skin- rash-none, lesions- none, excoriation- none Lymphadenopathy- none Head- atraumatic            Eyes- Gross vision intact, PERRLA, conjunctivae and secretions clear            Ears- Hearing, canals-normal            Nose- +sniffing, Clear, no-Septal dev, mucus, polyps, erosion, perforation             Throat- Mallampati II , mucosa clear , drainage- none, tonsils- atrophic Neck- flexible , trachea midline, no stridor , thyroid nl, carotid no bruit Chest - symmetrical excursion , unlabored           Heart/CV- RRR , no murmur , no gallop  , no rub, nl s1 s2                           - JVD- none , edema- none, stasis  changes- none, varices- none           Lung- clear to P&A, wheeze- none, cough- none , dullness-none, rub- none           Chest wall-  Abd-  Br/ Gen/ Rectal- Not done, not indicated Extrem- cyanosis- none, clubbing, none, atrophy- none, strength- nl. Brace on R thumb. Neuro- grossly intact to observation

## 2012-12-24 ENCOUNTER — Ambulatory Visit (INDEPENDENT_AMBULATORY_CARE_PROVIDER_SITE_OTHER): Payer: BC Managed Care – PPO

## 2012-12-24 DIAGNOSIS — J309 Allergic rhinitis, unspecified: Secondary | ICD-10-CM

## 2012-12-29 NOTE — Assessment & Plan Note (Signed)
Generally good control with some increased symptoms only in peak season. She does not feel need to change and has not been needing antihistamines which would be the first intervention if needed.

## 2012-12-29 NOTE — Assessment & Plan Note (Signed)
Advair is sufficient with rare need for rescue inhaler. He can continue present treatment.

## 2012-12-31 ENCOUNTER — Ambulatory Visit: Payer: BC Managed Care – PPO

## 2013-01-03 ENCOUNTER — Ambulatory Visit (INDEPENDENT_AMBULATORY_CARE_PROVIDER_SITE_OTHER): Payer: BC Managed Care – PPO

## 2013-01-03 DIAGNOSIS — J309 Allergic rhinitis, unspecified: Secondary | ICD-10-CM

## 2013-01-08 ENCOUNTER — Telehealth: Payer: Self-pay | Admitting: Family Medicine

## 2013-01-08 NOTE — Telephone Encounter (Signed)
Refill request for Valsartan HCTZ 160-25 mg & Pantoprazole 40 mg and send to [facility]. ( 90 day supply ) Can we refill these?

## 2013-01-09 NOTE — Telephone Encounter (Signed)
Refill both for one year. 

## 2013-01-10 ENCOUNTER — Ambulatory Visit (INDEPENDENT_AMBULATORY_CARE_PROVIDER_SITE_OTHER): Payer: BC Managed Care – PPO

## 2013-01-10 DIAGNOSIS — J309 Allergic rhinitis, unspecified: Secondary | ICD-10-CM

## 2013-01-10 MED ORDER — VALSARTAN-HYDROCHLOROTHIAZIDE 160-25 MG PO TABS: 1.0000 | ORAL_TABLET | Freq: Every day | ORAL | Status: DC

## 2013-01-10 MED ORDER — PANTOPRAZOLE SODIUM 40 MG PO TBEC: 40.0000 mg | DELAYED_RELEASE_TABLET | Freq: Every day | ORAL | Status: DC

## 2013-01-10 NOTE — Telephone Encounter (Signed)
I sent both scripts e-scribe. 

## 2013-01-13 ENCOUNTER — Ambulatory Visit (INDEPENDENT_AMBULATORY_CARE_PROVIDER_SITE_OTHER): Payer: BC Managed Care – PPO

## 2013-01-13 DIAGNOSIS — J309 Allergic rhinitis, unspecified: Secondary | ICD-10-CM

## 2013-01-14 ENCOUNTER — Ambulatory Visit: Payer: BC Managed Care – PPO

## 2013-01-17 ENCOUNTER — Ambulatory Visit (INDEPENDENT_AMBULATORY_CARE_PROVIDER_SITE_OTHER): Payer: BC Managed Care – PPO

## 2013-01-17 DIAGNOSIS — J309 Allergic rhinitis, unspecified: Secondary | ICD-10-CM

## 2013-01-21 ENCOUNTER — Other Ambulatory Visit: Payer: Self-pay | Admitting: Internal Medicine

## 2013-01-21 MED ORDER — FLUTICASONE-SALMETEROL 250-50 MCG/DOSE IN AEPB: 1.0000 | INHALATION_SPRAY | Freq: Two times a day (BID) | RESPIRATORY_TRACT | Status: DC

## 2013-01-21 NOTE — Telephone Encounter (Signed)
Per CY-okay to refill; RX sent to [facility].

## 2013-01-23 ENCOUNTER — Ambulatory Visit (INDEPENDENT_AMBULATORY_CARE_PROVIDER_SITE_OTHER): Payer: BC Managed Care – PPO

## 2013-01-23 DIAGNOSIS — J309 Allergic rhinitis, unspecified: Secondary | ICD-10-CM

## 2013-01-30 ENCOUNTER — Ambulatory Visit (INDEPENDENT_AMBULATORY_CARE_PROVIDER_SITE_OTHER): Payer: BC Managed Care – PPO

## 2013-01-30 DIAGNOSIS — J309 Allergic rhinitis, unspecified: Secondary | ICD-10-CM

## 2013-02-13 ENCOUNTER — Ambulatory Visit: Payer: BC Managed Care – PPO

## 2013-02-14 ENCOUNTER — Ambulatory Visit (INDEPENDENT_AMBULATORY_CARE_PROVIDER_SITE_OTHER): Payer: BC Managed Care – PPO

## 2013-02-14 DIAGNOSIS — J309 Allergic rhinitis, unspecified: Secondary | ICD-10-CM

## 2013-02-21 ENCOUNTER — Ambulatory Visit: Payer: BC Managed Care – PPO

## 2013-02-27 ENCOUNTER — Ambulatory Visit (INDEPENDENT_AMBULATORY_CARE_PROVIDER_SITE_OTHER): Payer: BC Managed Care – PPO

## 2013-02-27 DIAGNOSIS — J309 Allergic rhinitis, unspecified: Secondary | ICD-10-CM

## 2013-03-04 ENCOUNTER — Telehealth: Payer: Self-pay | Admitting: Family Medicine

## 2013-03-04 NOTE — Telephone Encounter (Signed)
Pt wants to know if she needs to have labs drawn & a CPE? She will be due for a refill on Verapamil 120mg  and a 90 day supply soon.

## 2013-03-07 ENCOUNTER — Ambulatory Visit (INDEPENDENT_AMBULATORY_CARE_PROVIDER_SITE_OTHER): Payer: BC Managed Care – PPO

## 2013-03-07 DIAGNOSIS — J309 Allergic rhinitis, unspecified: Secondary | ICD-10-CM

## 2013-03-09 NOTE — Telephone Encounter (Signed)
Yes set her up for labs and cpx soon

## 2013-03-10 NOTE — Telephone Encounter (Signed)
Please schedule this patient for a physical with labs.  Let me know if she will need a refill. Thanks.

## 2013-03-14 ENCOUNTER — Other Ambulatory Visit: Payer: Self-pay | Admitting: Certified Nurse Midwife

## 2013-03-14 ENCOUNTER — Other Ambulatory Visit (INDEPENDENT_AMBULATORY_CARE_PROVIDER_SITE_OTHER): Payer: BC Managed Care – PPO

## 2013-03-14 ENCOUNTER — Ambulatory Visit: Payer: BC Managed Care – PPO

## 2013-03-14 DIAGNOSIS — Z0000 Encounter for general adult medical examination without abnormal findings: Secondary | ICD-10-CM

## 2013-03-14 DIAGNOSIS — Z1231 Encounter for screening mammogram for malignant neoplasm of breast: Secondary | ICD-10-CM

## 2013-03-14 DIAGNOSIS — R7989 Other specified abnormal findings of blood chemistry: Secondary | ICD-10-CM

## 2013-03-14 LAB — HEPATIC FUNCTION PANEL
ALT: 21 U/L (ref 0–35)
AST: 21 U/L (ref 0–37)
Bilirubin, Direct: 0 mg/dL (ref 0.0–0.3)
Total Bilirubin: 0.7 mg/dL (ref 0.3–1.2)

## 2013-03-14 LAB — POCT URINALYSIS DIPSTICK
Bilirubin, UA: NEGATIVE
Blood, UA: NEGATIVE
Spec Grav, UA: 1.01
pH, UA: 7.5

## 2013-03-14 LAB — CBC WITH DIFFERENTIAL/PLATELET
Eosinophils Relative: 1.4 % (ref 0.0–5.0)
HCT: 40.3 % (ref 36.0–46.0)
Monocytes Relative: 7.5 % (ref 3.0–12.0)
Neutrophils Relative %: 59.5 % (ref 43.0–77.0)
Platelets: 217 10*3/uL (ref 150.0–400.0)
RBC: 4.69 Mil/uL (ref 3.87–5.11)
WBC: 6.3 10*3/uL (ref 4.5–10.5)

## 2013-03-14 LAB — BASIC METABOLIC PANEL
Calcium: 9.2 mg/dL (ref 8.4–10.5)
Creatinine, Ser: 0.8 mg/dL (ref 0.4–1.2)
GFR: 81.33 mL/min (ref >60.00)
Glucose, Bld: 85 mg/dL (ref 70–99)
Sodium: 140 mEq/L (ref 135–145)

## 2013-03-14 LAB — LIPID PANEL
HDL: 42.4 mg/dL (ref >39.00)
VLDL: 26 mg/dL (ref 0.0–40.0)

## 2013-03-14 LAB — LDL CHOLESTEROL, DIRECT: Direct LDL: 143.9 mg/dL

## 2013-03-19 NOTE — Progress Notes (Signed)
Quick Note:  Pt has appointment on 03/21/13 will go over then. ______

## 2013-03-21 ENCOUNTER — Ambulatory Visit (INDEPENDENT_AMBULATORY_CARE_PROVIDER_SITE_OTHER): Payer: BC Managed Care – PPO

## 2013-03-21 ENCOUNTER — Encounter: Payer: Self-pay | Admitting: Family Medicine

## 2013-03-21 ENCOUNTER — Ambulatory Visit (INDEPENDENT_AMBULATORY_CARE_PROVIDER_SITE_OTHER): Payer: BC Managed Care – PPO | Admitting: Family Medicine

## 2013-03-21 VITALS — BP 118/70 | HR 105 | Temp 98.3°F | Ht 67.5 in | Wt 265.0 lb

## 2013-03-21 DIAGNOSIS — Z0000 Encounter for general adult medical examination without abnormal findings: Secondary | ICD-10-CM

## 2013-03-21 DIAGNOSIS — J309 Allergic rhinitis, unspecified: Secondary | ICD-10-CM

## 2013-03-21 MED ORDER — VERAPAMIL HCL 120 MG PO TABS: 120.0000 mg | ORAL_TABLET | Freq: Every day | ORAL | Status: DC

## 2013-03-21 NOTE — Progress Notes (Signed)
  Subjective:    Patient ID: Gabriela Shepherd, female    DOB: January 08, 1956, 57 y.o.   MRN: [id number]  HPI 57 yr old female for a cpx. She feels well.    Review of Systems  Constitutional: Negative.   HENT: Negative.   Eyes: Negative.   Respiratory: Negative.   Cardiovascular: Negative.   Gastrointestinal: Negative.   Genitourinary: Negative for dysuria, urgency, frequency, hematuria, flank pain, decreased urine volume, enuresis, difficulty urinating, pelvic pain and dyspareunia.  Musculoskeletal: Negative.   Skin: Negative.   Neurological: Negative.   Psychiatric/Behavioral: Negative.        Objective:   Physical Exam  Constitutional: She is oriented to person, place, and time. She appears well-developed and well-nourished. No distress.  HENT:  Head: Normocephalic and atraumatic.  Right Ear: External ear normal.  Left Ear: External ear normal.  Nose: Nose normal.  Mouth/Throat: Oropharynx is clear and moist. No oropharyngeal exudate.  Eyes: Conjunctivae and EOM are normal. Pupils are equal, round, and reactive to light. No scleral icterus.  Neck: Normal range of motion. Neck supple. No JVD present. No thyromegaly present.  Cardiovascular: Normal rate, regular rhythm, normal heart sounds and intact distal pulses.  Exam reveals no gallop and no friction rub.   No murmur heard. EKG normal  Pulmonary/Chest: Effort normal and breath sounds normal. No respiratory distress. She has no wheezes. She has no rales. She exhibits no tenderness.  Abdominal: Soft. Bowel sounds are normal. She exhibits no distension and no mass. There is no tenderness. There is no rebound and no guarding.  Musculoskeletal: Normal range of motion. She exhibits no edema and no tenderness.  Lymphadenopathy:    She has no cervical adenopathy.  Neurological: She is alert and oriented to person, place, and time. She has normal reflexes. No cranial nerve deficit. She exhibits normal muscle tone. Coordination normal.   Skin: Skin is warm and dry. No rash noted. No erythema.  Psychiatric: She has a normal mood and affect. Her behavior is normal. Judgment and thought content normal.          Assessment & Plan:  Well exam. She needs to exercise and lose some weight

## 2013-03-28 ENCOUNTER — Ambulatory Visit: Payer: BC Managed Care – PPO

## 2013-04-01 ENCOUNTER — Ambulatory Visit (HOSPITAL_COMMUNITY)
Admission: RE | Admit: 2013-04-01 | Discharge: 2013-04-01 | Disposition: A | Discharge status: Home / Self Care | Payer: BC Managed Care – PPO | Source: Ambulatory Visit | Attending: Certified Nurse Midwife | Admitting: Certified Nurse Midwife

## 2013-04-01 DIAGNOSIS — Z1231 Encounter for screening mammogram for malignant neoplasm of breast: Secondary | ICD-10-CM | POA: Insufficient documentation

## 2013-04-04 ENCOUNTER — Ambulatory Visit (INDEPENDENT_AMBULATORY_CARE_PROVIDER_SITE_OTHER): Payer: BC Managed Care – PPO

## 2013-04-04 DIAGNOSIS — J309 Allergic rhinitis, unspecified: Secondary | ICD-10-CM

## 2013-04-11 ENCOUNTER — Ambulatory Visit (INDEPENDENT_AMBULATORY_CARE_PROVIDER_SITE_OTHER): Payer: BC Managed Care – PPO

## 2013-04-11 DIAGNOSIS — J309 Allergic rhinitis, unspecified: Secondary | ICD-10-CM

## 2013-04-25 ENCOUNTER — Ambulatory Visit (INDEPENDENT_AMBULATORY_CARE_PROVIDER_SITE_OTHER): Payer: BC Managed Care – PPO

## 2013-04-25 DIAGNOSIS — J309 Allergic rhinitis, unspecified: Secondary | ICD-10-CM

## 2013-05-02 ENCOUNTER — Ambulatory Visit (INDEPENDENT_AMBULATORY_CARE_PROVIDER_SITE_OTHER): Payer: BC Managed Care – PPO

## 2013-05-02 DIAGNOSIS — J309 Allergic rhinitis, unspecified: Secondary | ICD-10-CM

## 2013-05-09 ENCOUNTER — Ambulatory Visit (INDEPENDENT_AMBULATORY_CARE_PROVIDER_SITE_OTHER): Payer: BC Managed Care – PPO

## 2013-05-09 DIAGNOSIS — J309 Allergic rhinitis, unspecified: Secondary | ICD-10-CM

## 2013-05-16 ENCOUNTER — Ambulatory Visit (INDEPENDENT_AMBULATORY_CARE_PROVIDER_SITE_OTHER): Payer: BC Managed Care – PPO

## 2013-05-16 DIAGNOSIS — Z23 Encounter for immunization: Secondary | ICD-10-CM

## 2013-05-23 ENCOUNTER — Ambulatory Visit (INDEPENDENT_AMBULATORY_CARE_PROVIDER_SITE_OTHER): Payer: BC Managed Care – PPO

## 2013-05-23 DIAGNOSIS — J309 Allergic rhinitis, unspecified: Secondary | ICD-10-CM

## 2013-05-30 ENCOUNTER — Ambulatory Visit: Payer: BC Managed Care – PPO

## 2013-06-06 ENCOUNTER — Ambulatory Visit (INDEPENDENT_AMBULATORY_CARE_PROVIDER_SITE_OTHER): Payer: BC Managed Care – PPO

## 2013-06-06 DIAGNOSIS — J309 Allergic rhinitis, unspecified: Secondary | ICD-10-CM

## 2013-06-13 ENCOUNTER — Ambulatory Visit: Payer: BC Managed Care – PPO

## 2013-06-27 ENCOUNTER — Ambulatory Visit (INDEPENDENT_AMBULATORY_CARE_PROVIDER_SITE_OTHER): Payer: BC Managed Care – PPO

## 2013-06-27 DIAGNOSIS — J309 Allergic rhinitis, unspecified: Secondary | ICD-10-CM

## 2013-06-30 ENCOUNTER — Ambulatory Visit (INDEPENDENT_AMBULATORY_CARE_PROVIDER_SITE_OTHER): Payer: BC Managed Care – PPO

## 2013-06-30 DIAGNOSIS — J309 Allergic rhinitis, unspecified: Secondary | ICD-10-CM

## 2013-07-11 ENCOUNTER — Ambulatory Visit (INDEPENDENT_AMBULATORY_CARE_PROVIDER_SITE_OTHER): Payer: BC Managed Care – PPO

## 2013-07-11 DIAGNOSIS — J309 Allergic rhinitis, unspecified: Secondary | ICD-10-CM

## 2013-07-18 ENCOUNTER — Ambulatory Visit (INDEPENDENT_AMBULATORY_CARE_PROVIDER_SITE_OTHER): Payer: BC Managed Care – PPO

## 2013-07-18 DIAGNOSIS — J309 Allergic rhinitis, unspecified: Secondary | ICD-10-CM

## 2013-08-01 ENCOUNTER — Ambulatory Visit (INDEPENDENT_AMBULATORY_CARE_PROVIDER_SITE_OTHER): Payer: Managed Care, Other (non HMO)

## 2013-08-01 ENCOUNTER — Telehealth: Payer: Self-pay | Admitting: Internal Medicine

## 2013-08-01 DIAGNOSIS — J309 Allergic rhinitis, unspecified: Secondary | ICD-10-CM

## 2013-08-04 ENCOUNTER — Telehealth: Payer: Self-pay | Admitting: Family Medicine

## 2013-08-04 ENCOUNTER — Other Ambulatory Visit: Payer: Self-pay | Admitting: Internal Medicine

## 2013-08-04 MED ORDER — VALSARTAN-HYDROCHLOROTHIAZIDE 160-25 MG PO TABS: 1.0000 | ORAL_TABLET | Freq: Every day | ORAL | Status: DC

## 2013-08-04 MED ORDER — PANTOPRAZOLE SODIUM 40 MG PO TBEC: 40.0000 mg | DELAYED_RELEASE_TABLET | Freq: Every day | ORAL | Status: DC

## 2013-08-04 MED ORDER — VERAPAMIL HCL 120 MG PO TABS
120.0000 mg | ORAL_TABLET | Freq: Every day | ORAL | Status: DC
Start: 2013-08-04 — End: 2014-04-20

## 2013-08-04 NOTE — Telephone Encounter (Signed)
I sent all 3 scripts e-scribe and a 90 day supply.

## 2013-08-04 NOTE — Telephone Encounter (Signed)
Refill request for Valsartan/HCTZ, Pantoprazole, Verapamil and send to [facility] home delivery, pt has changed pharmacies.

## 2013-08-05 MED ORDER — FLUTICASONE-SALMETEROL 250-50 MCG/DOSE IN AEPB: 1.0000 | INHALATION_SPRAY | Freq: Two times a day (BID) | RESPIRATORY_TRACT | Status: DC

## 2013-08-05 NOTE — Telephone Encounter (Signed)
Gabriela Jersey do you have these forms that need to be sent to [facility] for the advair?

## 2013-08-05 NOTE — Telephone Encounter (Signed)
Refill sent and pt is aware. Jennifer Castillo, CMA  

## 2013-08-05 NOTE — Telephone Encounter (Signed)
I have the forms; after reviewing the papers and patients chart-looks as though patient has refills to last until 12-2013; Pt can get refill on or after 08-29-13. The form is also generic-we can send refills electronically to [facility] delivery [phone]. Thanks.

## 2013-08-08 ENCOUNTER — Ambulatory Visit (INDEPENDENT_AMBULATORY_CARE_PROVIDER_SITE_OTHER): Payer: Managed Care, Other (non HMO)

## 2013-08-08 DIAGNOSIS — J309 Allergic rhinitis, unspecified: Secondary | ICD-10-CM

## 2013-08-13 ENCOUNTER — Encounter: Payer: Self-pay | Admitting: Certified Nurse Midwife

## 2013-08-15 ENCOUNTER — Ambulatory Visit: Payer: Managed Care, Other (non HMO)

## 2013-08-20 ENCOUNTER — Ambulatory Visit (INDEPENDENT_AMBULATORY_CARE_PROVIDER_SITE_OTHER): Payer: Managed Care, Other (non HMO)

## 2013-08-20 DIAGNOSIS — J309 Allergic rhinitis, unspecified: Secondary | ICD-10-CM

## 2013-08-29 ENCOUNTER — Ambulatory Visit (INDEPENDENT_AMBULATORY_CARE_PROVIDER_SITE_OTHER): Payer: Managed Care, Other (non HMO)

## 2013-08-29 DIAGNOSIS — J309 Allergic rhinitis, unspecified: Secondary | ICD-10-CM

## 2013-09-04 ENCOUNTER — Ambulatory Visit (INDEPENDENT_AMBULATORY_CARE_PROVIDER_SITE_OTHER): Payer: Managed Care, Other (non HMO)

## 2013-09-04 DIAGNOSIS — J309 Allergic rhinitis, unspecified: Secondary | ICD-10-CM

## 2013-09-05 ENCOUNTER — Ambulatory Visit: Payer: Managed Care, Other (non HMO)

## 2013-09-19 ENCOUNTER — Ambulatory Visit (INDEPENDENT_AMBULATORY_CARE_PROVIDER_SITE_OTHER): Payer: Managed Care, Other (non HMO)

## 2013-09-19 DIAGNOSIS — J309 Allergic rhinitis, unspecified: Secondary | ICD-10-CM

## 2013-09-26 ENCOUNTER — Ambulatory Visit (INDEPENDENT_AMBULATORY_CARE_PROVIDER_SITE_OTHER): Payer: Managed Care, Other (non HMO)

## 2013-09-26 DIAGNOSIS — J309 Allergic rhinitis, unspecified: Secondary | ICD-10-CM

## 2013-10-03 ENCOUNTER — Ambulatory Visit (INDEPENDENT_AMBULATORY_CARE_PROVIDER_SITE_OTHER): Payer: Managed Care, Other (non HMO)

## 2013-10-03 DIAGNOSIS — J309 Allergic rhinitis, unspecified: Secondary | ICD-10-CM

## 2013-10-10 ENCOUNTER — Ambulatory Visit: Payer: Managed Care, Other (non HMO)

## 2013-10-16 ENCOUNTER — Ambulatory Visit (INDEPENDENT_AMBULATORY_CARE_PROVIDER_SITE_OTHER): Payer: Managed Care, Other (non HMO)

## 2013-10-16 DIAGNOSIS — J309 Allergic rhinitis, unspecified: Secondary | ICD-10-CM

## 2013-10-17 ENCOUNTER — Encounter: Payer: Self-pay | Admitting: Internal Medicine

## 2013-10-30 ENCOUNTER — Ambulatory Visit (INDEPENDENT_AMBULATORY_CARE_PROVIDER_SITE_OTHER): Payer: Managed Care, Other (non HMO)

## 2013-10-30 DIAGNOSIS — J309 Allergic rhinitis, unspecified: Secondary | ICD-10-CM

## 2013-11-07 ENCOUNTER — Ambulatory Visit (INDEPENDENT_AMBULATORY_CARE_PROVIDER_SITE_OTHER): Payer: Managed Care, Other (non HMO)

## 2013-11-07 DIAGNOSIS — J309 Allergic rhinitis, unspecified: Secondary | ICD-10-CM

## 2013-11-13 ENCOUNTER — Ambulatory Visit (INDEPENDENT_AMBULATORY_CARE_PROVIDER_SITE_OTHER): Payer: Managed Care, Other (non HMO)

## 2013-11-13 DIAGNOSIS — J309 Allergic rhinitis, unspecified: Secondary | ICD-10-CM

## 2013-11-14 ENCOUNTER — Ambulatory Visit: Payer: Managed Care, Other (non HMO)

## 2013-11-27 ENCOUNTER — Ambulatory Visit (INDEPENDENT_AMBULATORY_CARE_PROVIDER_SITE_OTHER): Payer: Managed Care, Other (non HMO)

## 2013-11-27 DIAGNOSIS — J309 Allergic rhinitis, unspecified: Secondary | ICD-10-CM

## 2013-12-02 ENCOUNTER — Ambulatory Visit (INDEPENDENT_AMBULATORY_CARE_PROVIDER_SITE_OTHER): Payer: Managed Care, Other (non HMO)

## 2013-12-02 DIAGNOSIS — J309 Allergic rhinitis, unspecified: Secondary | ICD-10-CM

## 2013-12-04 ENCOUNTER — Ambulatory Visit (INDEPENDENT_AMBULATORY_CARE_PROVIDER_SITE_OTHER): Payer: Managed Care, Other (non HMO) | Admitting: Certified Nurse Midwife

## 2013-12-04 ENCOUNTER — Encounter: Payer: Self-pay | Admitting: Certified Nurse Midwife

## 2013-12-04 VITALS — BP 120/70 | HR 72 | Resp 16 | Ht 67.0 in | Wt 269.0 lb

## 2013-12-04 DIAGNOSIS — Z124 Encounter for screening for malignant neoplasm of cervix: Secondary | ICD-10-CM

## 2013-12-04 DIAGNOSIS — Z01419 Encounter for gynecological examination (general) (routine) without abnormal findings: Secondary | ICD-10-CM

## 2013-12-04 DIAGNOSIS — N841 Polyp of cervix uteri: Secondary | ICD-10-CM

## 2013-12-04 NOTE — Progress Notes (Signed)
Reviewed personally.  M. Suzanne Sache Sane, MD.  

## 2013-12-04 NOTE — Patient Instructions (Signed)

## 2013-12-04 NOTE — Progress Notes (Signed)
58 y.o. [name] Single Caucasian Fe here for annual exam.  Menopausal, no HRT. Denies vaginal bleeding or vaginal dryness. Hypertension/asthma stable on medication with PCP management. Aware she has gained weight due to knee problems, needs knee replacement left, but unable to do at present. Has had to decrease exercise due to this. Working on [employer] with food portion size now. No health issues today.  Patient's last menstrual period was 11/21/2009.          Sexually active: no  The current method of family planning is tubal ligation.    Exercising: no  exercise Smoker:  no  Health Maintenance: Pap: 11-07-11 neg HPV HR neg MMG:  04-01-13 normal Colonoscopy:  11-27-08 f/u 26yrs BMD:   none TDaP:  2009 Labs: none Self breast exam: done occ   reports that she has never smoked. She has never used smokeless tobacco. She reports that she does not drink alcohol or use illicit drugs.  Past Medical History  Diagnosis Date  . Anxiety   . Asthma     sees Dr. Annamaria Boots  . GERD (gastroesophageal reflux disease)   . Hypertension   . Allergic rhinitis     sees Dr. Annamaria Boots  . Headache(784.0)   . Osteoarthritis   . Carpal tunnel syndrome     sees Dr. Amedeo Plenty  . Gynecological examination     sees Dr. Elyse Hsu    Past Surgical History  Procedure Laterality Date  . Bunionectomy    . Colonoscopy  11-27-08    per Dr. Olevia Perches, hyperplastic polyps, repeat in 7 yrs   . Hysteroscopic polyp resection    . Knee arthroscopy    . Tubal ligation      Current Outpatient Prescriptions  Medication Sig Dispense Refill  . Fluticasone-Salmeterol (ADVAIR) 250-50 MCG/DOSE AEPB Inhale 1 puff into the lungs 2 (two) times daily.  180 each  3  . Multiple Vitamin (MULTIVITAMIN) tablet Take 1 tablet by mouth daily.      . pantoprazole (PROTONIX) 40 MG tablet Take 1 tablet (40 mg total) by mouth daily.  90 tablet  2  . PROAIR HFA 108 (90 BASE) MCG/ACT inhaler INHALE 2 PUFFS BY MOUTH EVERY 4 HOURS AS NEEDED FOR RESCUE  BRONCHODILATOR  8.5 each  5  . UNABLE TO FIND Allergy injection      . valsartan-hydrochlorothiazide (DIOVAN HCT) 160-25 MG per tablet Take 1 tablet by mouth daily.  90 tablet  2  . verapamil (CALAN) 120 MG tablet Take 1 tablet (120 mg total) by mouth daily.  90 tablet  2   No current facility-administered medications for this visit.    Family History  Problem Relation Age of Onset  . Liver cancer Mother     deceased   . Hypertension Mother   . Heart disease Father   . Hypertension Father   . Diabetes Maternal Grandmother   . Cancer Maternal Aunt     ROS:  Pertinent items are noted in HPI.  Otherwise, a comprehensive ROS was negative.  Exam:   BP 120/70  Pulse 72  Resp 16  Ht 5\' 7"  (1.702 m)  Wt 269 lb (122.018 kg)  BMI 42.12 kg/m2  LMP 11/21/2009 Height: 5\' 7"  (170.2 cm)  Ht Readings from Last 3 Encounters:  12/04/13 5\' 7"  (1.702 m)  03/21/13 5' 7.5" (1.715 m)  12/17/12 5\' 7"  (1.702 m)    General appearance: alert, cooperative and appears stated age Head: Normocephalic, without obvious abnormality, atraumatic Neck: no adenopathy, supple, symmetrical,  trachea midline and thyroid normal to inspection and palpation Lungs: clear to auscultation bilaterally Breasts: normal appearance, no masses or tenderness, No nipple retraction or dimpling, No nipple discharge or bleeding, No axillary or supraclavicular adenopathy Heart: regular rate and rhythm Abdomen: soft, non-tender; no masses,  no organomegaly Extremities: extremities normal, atraumatic, no cyanosis or edema Skin: Skin color, texture, turgor normal. No rashes or lesions Lymph nodes: Cervical, supraclavicular, and axillary nodes normal. No abnormal inguinal nodes palpated Neurologic: Grossly normal   Pelvic: External genitalia:  no lesions              Urethra:  normal appearing urethra with no masses, tenderness or lesions              Bartholin's and Skene's: normal                 Vagina: normal appearing  vagina with normal color and discharge, no lesions              Cervix: normal appearance with 2-3 cm elongated polyp noted in cervical os, bleeding noted with pap only.              Pap taken: yes Bimanual Exam:  Uterus:  normal size, contour, position, consistency, mobility, non-tender and mid positon              Adnexa: normal adnexa and no mass, fullness, tenderness               Rectovaginal: Confirms               Anus:  normal sphincter tone, no lesions  A:  Well Woman with normal exam  Menopausal no HRT  Cervical polyp, history of in past with removal  Hypertension/asthma stable medication with PCP management  Obesity, working on weight control  P:   Reviewed health and wellness pertinent to exam  Aware of need to evaluate if vaginal bleeding  Discussed findings of usually benign and can be removed or left in place and monitored unless symptomatic with spotting. Patient aware and will notify if bleeding occurs.  Continue follow up as indicated  Pap smear taken today with HPV reflex   counseled on breast self exam, mammography screening, adequate intake of calcium and vitamin D, diet and exercise to decrease health risk. Encouraged swimming if possible, due to not injuring knees.  return annually or prn  An After Visit Summary was printed and given to the patient.

## 2013-12-05 ENCOUNTER — Ambulatory Visit (INDEPENDENT_AMBULATORY_CARE_PROVIDER_SITE_OTHER): Payer: Managed Care, Other (non HMO)

## 2013-12-05 DIAGNOSIS — J309 Allergic rhinitis, unspecified: Secondary | ICD-10-CM

## 2013-12-09 ENCOUNTER — Ambulatory Visit: Payer: BC Managed Care – PPO | Admitting: Certified Nurse Midwife

## 2013-12-09 LAB — IPS PAP TEST WITH REFLEX TO HPV

## 2013-12-12 ENCOUNTER — Ambulatory Visit: Payer: Managed Care, Other (non HMO)

## 2013-12-16 ENCOUNTER — Ambulatory Visit: Payer: BC Managed Care – PPO | Admitting: Certified Nurse Midwife

## 2013-12-19 ENCOUNTER — Ambulatory Visit (INDEPENDENT_AMBULATORY_CARE_PROVIDER_SITE_OTHER): Payer: Managed Care, Other (non HMO)

## 2013-12-19 DIAGNOSIS — J309 Allergic rhinitis, unspecified: Secondary | ICD-10-CM

## 2013-12-23 ENCOUNTER — Ambulatory Visit: Payer: BC Managed Care – PPO | Admitting: Internal Medicine

## 2013-12-25 ENCOUNTER — Ambulatory Visit: Payer: Managed Care, Other (non HMO) | Admitting: Internal Medicine

## 2013-12-26 ENCOUNTER — Ambulatory Visit: Payer: Managed Care, Other (non HMO)

## 2014-01-02 ENCOUNTER — Ambulatory Visit (INDEPENDENT_AMBULATORY_CARE_PROVIDER_SITE_OTHER): Payer: Managed Care, Other (non HMO)

## 2014-01-02 DIAGNOSIS — J309 Allergic rhinitis, unspecified: Secondary | ICD-10-CM

## 2014-01-09 ENCOUNTER — Ambulatory Visit (INDEPENDENT_AMBULATORY_CARE_PROVIDER_SITE_OTHER): Payer: Managed Care, Other (non HMO)

## 2014-01-09 DIAGNOSIS — J309 Allergic rhinitis, unspecified: Secondary | ICD-10-CM

## 2014-01-16 ENCOUNTER — Ambulatory Visit (INDEPENDENT_AMBULATORY_CARE_PROVIDER_SITE_OTHER): Payer: Managed Care, Other (non HMO) | Admitting: Internal Medicine

## 2014-01-16 ENCOUNTER — Encounter: Payer: Self-pay | Admitting: Internal Medicine

## 2014-01-16 ENCOUNTER — Ambulatory Visit (INDEPENDENT_AMBULATORY_CARE_PROVIDER_SITE_OTHER): Payer: Managed Care, Other (non HMO)

## 2014-01-16 VITALS — BP 122/78 | HR 82 | Ht 67.0 in | Wt 270.0 lb

## 2014-01-16 DIAGNOSIS — J309 Allergic rhinitis, unspecified: Secondary | ICD-10-CM

## 2014-01-16 DIAGNOSIS — J301 Allergic rhinitis due to pollen: Secondary | ICD-10-CM

## 2014-01-16 DIAGNOSIS — J45909 Unspecified asthma, uncomplicated: Secondary | ICD-10-CM

## 2014-01-16 DIAGNOSIS — J45998 Other asthma: Secondary | ICD-10-CM

## 2014-01-16 NOTE — Assessment & Plan Note (Signed)
Mild intermittent asthma, uncomplicated, well controlled. May be able to stop Advair or reduce to 100 strength.

## 2014-01-16 NOTE — Progress Notes (Signed)
Subjective:    Patient ID: Gabriela Shepherd, female    DOB: [birth year], 58 y.o.   MRN: [id number]  HPI 77 yoF followed for allergic rhinitis and asthma. Last here November 16, 2009 reporting a good year at that time.  Continues allergy vaccine 1:10 here and thinks the shots help "I can breathe". Continues Advair. Went months not needing Proair, but used it about 5 times in past month. One episode after strong fragrance exposure. No bad episodes. Denies any nasal symptoms- not needing antihistamines.   12/15/11- 62 yoF followed for allergic rhinitis and asthma. Last here November 16, 2009 reporting a good year at that time.  No troubles with breathing or allergies at this time; still on allergy vaccine Has done well this spring. She is satisfied that allergy vaccine helps her with no problems. Has not needed antihistamines. Denies any wheezing as long as she continues Advair with only occasional use of her rescue inhaler.  12/17/12- 33 yoF followed for allergic rhinitis and asthma. Last here November 16, 2009 reporting a good year at that time.  FOLLOWS FOR: Still on allergy vaccine 1:10 GH; itchy, watery eyes at times.  She considers allergy vaccine control good most of the time. Does not need antihistamines. Intake season has a little more rhinorrhea and itching of eyes. Denies chest tightness or wheeze while continuing Advair. Rarely needs rescue inhaler.  01/16/14- 82 yoF followed for allergic rhinitis and asthma. Last here November 16, 2009 reporting a good year at that time. FOLLOWS FOR:  Vaccine doing well.  No concerns today allergy vaccine 1:10 GH- helps and well tolerated Occasional cough or hoarseness, but no wheeze or need for rescue inhaler. Using Advair 250 once daily.  ROS-see HPI Constitutional:   No-   weight loss, night sweats, fevers, chills, fatigue, lassitude. HEENT:   No-  headaches, difficulty swallowing, tooth/dental problems, sore throat,       No-  sneezing, +itching, ear ache,  +nasal congestion, post nasal drip,  CV:  No-   chest pain, orthopnea, PND, swelling in lower extremities, anasarca,   dizziness, palpitations Resp: No-   shortness of breath with exertion or at rest.              No-   productive cough,  No non-productive cough,  No- coughing up of blood.              No-   change in color of mucus.  +Little wheezing.   Skin: No-   rash or lesions. GI:  No-   heartburn, indigestion, abdominal pain, nausea, vomiting,  GU:  MS:  No-   joint pain or swelling.  . Neuro-     nothing unusual Psych:  No- change in mood or affect. No depression or anxiety.  No memory loss.  OBJ- Physical Exam General- Alert, Oriented, Affect-appropriate, Distress- none acute, overweight Skin- rash-none, lesions- none, excoriation- none Lymphadenopathy- none Head- atraumatic            Eyes- Gross vision intact, PERRLA, conjunctivae and secretions clear            Ears- Hearing, canals-normal            Nose- Clear, no-Septal dev, mucus, polyps, erosion, perforation             Throat- Mallampati II , mucosa clear , drainage- none, tonsils- atrophic Neck- flexible , trachea midline, no stridor , thyroid nl, carotid no bruit Chest - symmetrical excursion , unlabored  Heart/CV- RRR , no murmur , no gallop  , no rub, nl s1 s2                           - JVD- none , edema- none, stasis changes- none, varices- none           Lung- clear to P&A, wheeze- none, cough- none , dullness-none, rub- none           Chest wall-  Abd-  Br/ Gen/ Rectal- Not done, not indicated Extrem- cyanosis- none, clubbing, none, atrophy- none, strength- nl. Brace on R thumb. Neuro- grossly intact to observation

## 2014-01-16 NOTE — Patient Instructions (Signed)
We can continue allergy vaccine 1:10 [place]  Ok to try off Advair, waiting to see what happens. Be quick to start back if you feel trouble coming. We can refill Advair and even consider reducing the strength to the Advair 100 as we find you need.   Keep the Proair rescue inhaler where you can get at it.

## 2014-01-16 NOTE — Assessment & Plan Note (Signed)
Ok to continue allergy vaccine. Well controlled.

## 2014-01-22 ENCOUNTER — Ambulatory Visit (INDEPENDENT_AMBULATORY_CARE_PROVIDER_SITE_OTHER): Payer: Managed Care, Other (non HMO)

## 2014-01-22 DIAGNOSIS — J309 Allergic rhinitis, unspecified: Secondary | ICD-10-CM

## 2014-01-29 ENCOUNTER — Ambulatory Visit (INDEPENDENT_AMBULATORY_CARE_PROVIDER_SITE_OTHER): Payer: Managed Care, Other (non HMO)

## 2014-01-29 DIAGNOSIS — J309 Allergic rhinitis, unspecified: Secondary | ICD-10-CM

## 2014-02-04 ENCOUNTER — Ambulatory Visit (INDEPENDENT_AMBULATORY_CARE_PROVIDER_SITE_OTHER): Payer: Managed Care, Other (non HMO)

## 2014-02-04 DIAGNOSIS — J309 Allergic rhinitis, unspecified: Secondary | ICD-10-CM

## 2014-02-13 ENCOUNTER — Ambulatory Visit (INDEPENDENT_AMBULATORY_CARE_PROVIDER_SITE_OTHER): Payer: Managed Care, Other (non HMO)

## 2014-02-13 DIAGNOSIS — J309 Allergic rhinitis, unspecified: Secondary | ICD-10-CM

## 2014-02-20 ENCOUNTER — Ambulatory Visit (INDEPENDENT_AMBULATORY_CARE_PROVIDER_SITE_OTHER): Payer: Managed Care, Other (non HMO)

## 2014-02-20 DIAGNOSIS — J309 Allergic rhinitis, unspecified: Secondary | ICD-10-CM

## 2014-02-27 ENCOUNTER — Ambulatory Visit (INDEPENDENT_AMBULATORY_CARE_PROVIDER_SITE_OTHER): Payer: Managed Care, Other (non HMO)

## 2014-02-27 DIAGNOSIS — J309 Allergic rhinitis, unspecified: Secondary | ICD-10-CM

## 2014-03-03 ENCOUNTER — Encounter: Payer: Self-pay | Admitting: Internal Medicine

## 2014-03-06 ENCOUNTER — Ambulatory Visit (INDEPENDENT_AMBULATORY_CARE_PROVIDER_SITE_OTHER): Payer: Managed Care, Other (non HMO)

## 2014-03-06 DIAGNOSIS — J309 Allergic rhinitis, unspecified: Secondary | ICD-10-CM

## 2014-03-13 ENCOUNTER — Ambulatory Visit (INDEPENDENT_AMBULATORY_CARE_PROVIDER_SITE_OTHER): Payer: Managed Care, Other (non HMO)

## 2014-03-13 DIAGNOSIS — J309 Allergic rhinitis, unspecified: Secondary | ICD-10-CM

## 2014-03-20 ENCOUNTER — Ambulatory Visit (INDEPENDENT_AMBULATORY_CARE_PROVIDER_SITE_OTHER): Payer: Managed Care, Other (non HMO)

## 2014-03-20 DIAGNOSIS — J309 Allergic rhinitis, unspecified: Secondary | ICD-10-CM

## 2014-03-27 ENCOUNTER — Ambulatory Visit (INDEPENDENT_AMBULATORY_CARE_PROVIDER_SITE_OTHER): Payer: Managed Care, Other (non HMO)

## 2014-03-27 DIAGNOSIS — J309 Allergic rhinitis, unspecified: Secondary | ICD-10-CM

## 2014-03-31 ENCOUNTER — Telehealth: Payer: Self-pay | Admitting: Family Medicine

## 2014-03-31 NOTE — Telephone Encounter (Signed)
Pt states she is having total knee replacement 10/12 and Dr Alvan Dame will fax info today. Pt would like to know if she needs OV for clearance.

## 2014-04-01 NOTE — Telephone Encounter (Signed)
Pt has been scheduled.  °

## 2014-04-01 NOTE — Telephone Encounter (Signed)
Yes, pt will need a 30 minute visit scheduled.

## 2014-04-03 ENCOUNTER — Ambulatory Visit (INDEPENDENT_AMBULATORY_CARE_PROVIDER_SITE_OTHER): Payer: Managed Care, Other (non HMO)

## 2014-04-03 DIAGNOSIS — J309 Allergic rhinitis, unspecified: Secondary | ICD-10-CM

## 2014-04-06 ENCOUNTER — Encounter: Payer: Self-pay | Admitting: Family Medicine

## 2014-04-06 ENCOUNTER — Ambulatory Visit (INDEPENDENT_AMBULATORY_CARE_PROVIDER_SITE_OTHER): Payer: Managed Care, Other (non HMO) | Admitting: Family Medicine

## 2014-04-06 VITALS — BP 116/64 | Temp 98.0°F | Ht 67.0 in | Wt 259.0 lb

## 2014-04-06 DIAGNOSIS — J45909 Unspecified asthma, uncomplicated: Secondary | ICD-10-CM

## 2014-04-06 DIAGNOSIS — M1990 Unspecified osteoarthritis, unspecified site: Secondary | ICD-10-CM

## 2014-04-06 DIAGNOSIS — Z23 Encounter for immunization: Secondary | ICD-10-CM

## 2014-04-06 DIAGNOSIS — I10 Essential (primary) hypertension: Secondary | ICD-10-CM

## 2014-04-06 DIAGNOSIS — Z01818 Encounter for other preprocedural examination: Secondary | ICD-10-CM

## 2014-04-06 DIAGNOSIS — J45998 Other asthma: Secondary | ICD-10-CM

## 2014-04-06 DIAGNOSIS — K219 Gastro-esophageal reflux disease without esophagitis: Secondary | ICD-10-CM

## 2014-04-06 NOTE — Progress Notes (Signed)
Pre visit review using our clinic review tool, if applicable. No additional management support is needed unless otherwise documented below in the visit note. 

## 2014-04-06 NOTE — Progress Notes (Signed)
   Subjective:    Patient ID: Gabriela Shepherd, female    DOB: 08/26/1955, 58 y.o.   MRN: [id number]  HPI 58 yr old female here for a preoperative evaluation. She has severe osteoarthritis and is scheduled for a left total knee arthroplasty per Dr. Alvan Dame on 05-04-14. Other than her knee pain, she has been doing quite well. She exercises with water aerobics and swimming several days a week. Her BP has been stable. Her asthma has been stable, and Dr. Annamaria Boots even take her off her daily maintenance inhaler. She has not used her rescue inhaler since last year.    Review of Systems  Constitutional: Negative.   Respiratory: Negative.   Cardiovascular: Negative.   Gastrointestinal: Negative.   Endocrine: Negative.   Genitourinary: Negative.   Neurological: Negative.   Hematological: Negative.        Objective:   Physical Exam  Constitutional: She is oriented to person, place, and time. She appears well-developed and well-nourished.  Cardiovascular: Normal rate, regular rhythm, normal heart sounds and intact distal pulses.   EKG normal   Pulmonary/Chest: Effort normal and breath sounds normal.  Abdominal: Soft. Bowel sounds are normal. She exhibits no distension and no mass. There is no tenderness. There is no rebound and no guarding.  Neurological: She is alert and oriented to person, place, and time. No cranial nerve deficit. She exhibits normal muscle tone. Coordination normal.          Assessment & Plan:  She is cleared for the above surgery.

## 2014-04-07 ENCOUNTER — Other Ambulatory Visit: Payer: Self-pay | Admitting: Certified Nurse Midwife

## 2014-04-07 DIAGNOSIS — Z1231 Encounter for screening mammogram for malignant neoplasm of breast: Secondary | ICD-10-CM

## 2014-04-09 ENCOUNTER — Ambulatory Visit (HOSPITAL_COMMUNITY)
Admission: RE | Admit: 2014-04-09 | Discharge: 2014-04-09 | Disposition: A | Discharge status: Home / Self Care | Payer: Managed Care, Other (non HMO) | Source: Ambulatory Visit | Attending: Certified Nurse Midwife | Admitting: Certified Nurse Midwife

## 2014-04-09 DIAGNOSIS — Z1231 Encounter for screening mammogram for malignant neoplasm of breast: Secondary | ICD-10-CM | POA: Diagnosis not present

## 2014-04-10 ENCOUNTER — Ambulatory Visit: Payer: Managed Care, Other (non HMO)

## 2014-04-17 ENCOUNTER — Ambulatory Visit (INDEPENDENT_AMBULATORY_CARE_PROVIDER_SITE_OTHER): Payer: Managed Care, Other (non HMO)

## 2014-04-17 DIAGNOSIS — J309 Allergic rhinitis, unspecified: Secondary | ICD-10-CM

## 2014-04-20 ENCOUNTER — Ambulatory Visit (INDEPENDENT_AMBULATORY_CARE_PROVIDER_SITE_OTHER): Payer: Managed Care, Other (non HMO)

## 2014-04-20 ENCOUNTER — Encounter (HOSPITAL_COMMUNITY): Payer: Self-pay | Admitting: Pharmacy Technician

## 2014-04-20 DIAGNOSIS — J309 Allergic rhinitis, unspecified: Secondary | ICD-10-CM

## 2014-04-23 ENCOUNTER — Encounter (HOSPITAL_COMMUNITY)
Admission: RE | Admit: 2014-04-23 | Discharge: 2014-04-23 | Disposition: A | Discharge status: Home / Self Care | Payer: Managed Care, Other (non HMO) | Source: Ambulatory Visit | Attending: Orthopedic Surgery | Admitting: Orthopedic Surgery

## 2014-04-23 ENCOUNTER — Ambulatory Visit (HOSPITAL_COMMUNITY)
Admission: RE | Admit: 2014-04-23 | Discharge: 2014-04-23 | Disposition: A | Discharge status: Home / Self Care | Payer: Managed Care, Other (non HMO) | Source: Ambulatory Visit | Attending: Orthopedic Surgery | Admitting: Orthopedic Surgery

## 2014-04-23 ENCOUNTER — Encounter (HOSPITAL_COMMUNITY): Payer: Self-pay

## 2014-04-23 DIAGNOSIS — Z01812 Encounter for preprocedural laboratory examination: Secondary | ICD-10-CM | POA: Insufficient documentation

## 2014-04-23 DIAGNOSIS — J45909 Unspecified asthma, uncomplicated: Secondary | ICD-10-CM | POA: Diagnosis not present

## 2014-04-23 DIAGNOSIS — M1712 Unilateral primary osteoarthritis, left knee: Secondary | ICD-10-CM | POA: Insufficient documentation

## 2014-04-23 DIAGNOSIS — Z01810 Encounter for preprocedural cardiovascular examination: Secondary | ICD-10-CM | POA: Insufficient documentation

## 2014-04-23 DIAGNOSIS — I10 Essential (primary) hypertension: Secondary | ICD-10-CM | POA: Diagnosis not present

## 2014-04-23 LAB — URINALYSIS, ROUTINE W REFLEX MICROSCOPIC
Bilirubin Urine: NEGATIVE
Glucose, UA: NEGATIVE mg/dL
Hgb urine dipstick: NEGATIVE
Ketones, ur: NEGATIVE mg/dL
NITRITE: NEGATIVE
PH: 7.5 (ref 5.0–8.0)
Protein, ur: NEGATIVE mg/dL
Specific Gravity, Urine: 1.016 (ref 1.005–1.030)
UROBILINOGEN UA: 0.2 mg/dL (ref 0.0–1.0)

## 2014-04-23 LAB — CBC
HEMATOCRIT: 43.4 % (ref 36.0–46.0)
Hemoglobin: 14.7 g/dL (ref 12.0–15.0)
MCH: 29.3 pg (ref 26.0–34.0)
MCHC: 33.9 g/dL (ref 30.0–36.0)
MCV: 86.6 fL (ref 78.0–100.0)
Platelets: 225 10*3/uL (ref 150–400)
RBC: 5.01 MIL/uL (ref 3.87–5.11)
RDW: 13.3 % (ref 11.5–15.5)
WBC: 6.4 10*3/uL (ref 4.0–10.5)

## 2014-04-23 LAB — BASIC METABOLIC PANEL
Anion gap: 15 (ref 5–15)
BUN: 17 mg/dL (ref 6–23)
CHLORIDE: 101 meq/L (ref 96–112)
CO2: 25 meq/L (ref 19–32)
CREATININE: 0.72 mg/dL (ref 0.50–1.10)
Calcium: 9.9 mg/dL (ref 8.4–10.5)
GFR calc non Af Amer: >90 mL/min (ref >90)
Glucose, Bld: 97 mg/dL (ref 70–99)
Potassium: 3.8 mEq/L (ref 3.7–5.3)
Sodium: 141 mEq/L (ref 137–147)

## 2014-04-23 LAB — URINE MICROSCOPIC-ADD ON

## 2014-04-23 LAB — SURGICAL PCR SCREEN
MRSA, PCR: INVALID — AB
STAPHYLOCOCCUS AUREUS: INVALID — AB

## 2014-04-23 LAB — PROTIME-INR
INR: 1 (ref 0.00–1.49)
PROTHROMBIN TIME: 13.2 s (ref 11.6–15.2)

## 2014-04-23 LAB — APTT: aPTT: 29 seconds (ref 24–37)

## 2014-04-23 NOTE — Progress Notes (Signed)
CXR per PAT visit 04/23/2014 per EPIC sent to PCP Dr Alysia Penna and surgeon Dr Paralee Cancel

## 2014-04-23 NOTE — Progress Notes (Signed)
Urinalysis and mirco from PAT on 04/23/2014 per epic sent to Dr Paralee Cancel

## 2014-04-23 NOTE — Patient Instructions (Signed)
[place]  04/23/2014   Your procedure is scheduled on:      05/04/2014  Report to [facility] Main Entrance and follow signs to  [place] arrive at 10:00 AM.   Call this number if you have problems the morning of surgery [phone] or Presurgical Testing [phone].   Remember:  Do not eat food After Midnight;May have clear liquids till 7:00am day of surgery then nothing by mouth.  For Living Will and/or Health Care Power Attorney Forms: please provide copy for your medical record, may bring AM of surgery (forms should be already notarized-we do not provide this service).    Take these medicines the morning of surgery with A SIP OF WATER: Albuterol Inhaler if needed (bring day of surgery);pantoprazole (Protonix);Verapamil                                You may not have any metal on your body including hair pins and piercings  Do not wear jewelry, make-up, lotions, powders, or deodorant.  Do not shave body hair  48 hours(2 days) of CHG soap use.                Do not bring valuables to the hospital. [place].  Contacts, dentures or bridgework may not be worn into surgery.  Leave suitcase in the car. After surgery it may be brought to your room.  For patients admitted to the hospital, checkout time is 11:00 AM the day of discharge.   Special Instructions: review fact sheets for MRSA information, Blood Transfusion fact sheet, Incentive Spirometry. ________________________________________________________________________  [facility] - Preparing for Surgery Before surgery, you can play an important role.  Because skin is not sterile, your skin needs to be as free of germs as possible.  You can reduce the number of germs on your skin by washing with CHG (chlorahexidine gluconate) soap before surgery.  CHG is an antiseptic cleaner which kills germs and bonds with the skin to continue killing germs even after washing. Please DO  NOT use if you have an allergy to CHG or antibacterial soaps.  If your skin becomes reddened/irritated stop using the CHG and inform your nurse when you arrive at Short Stay. Do not shave (including legs and underarms) for at least 48 hours prior to the first CHG shower.  You may shave your face/neck. Please follow these instructions carefully:  1.  Shower with CHG Soap the night before surgery and the  morning of Surgery.  2.  If you choose to wash your hair, wash your hair first as usual with your  normal  shampoo.  3.  After you shampoo, rinse your hair and body thoroughly to remove the  shampoo.                           4.  Use CHG as you would any other liquid soap.  You can apply chg directly  to the skin and wash                       Gently with a scrungie or clean washcloth.  5.  Apply the CHG Soap to your body ONLY FROM THE NECK DOWN.   Do not use on face/ open  Wound or open sores. Avoid contact with eyes, ears mouth and genitals (private parts).                       Wash face,  Genitals (private parts) with your normal soap.             6.  Wash thoroughly, paying special attention to the area where your surgery  will be performed.  7.  Thoroughly rinse your body with warm water from the neck down.  8.  DO NOT shower/wash with your normal soap after using and rinsing off  the CHG Soap.                9.  Pat yourself dry with a clean towel.            10.  Wear clean pajamas.            11.  Place clean sheets on your bed the night of your first shower and do not  sleep with pets. Day of Surgery : Do not apply any lotions/deodorants the morning of surgery.  Please wear clean clothes to the hospital/surgery center.  FAILURE TO FOLLOW THESE INSTRUCTIONS MAY RESULT IN THE CANCELLATION OF YOUR SURGERY PATIENT SIGNATURE_________________________________  NURSE  SIGNATURE__________________________________  ________________________________________________________________________    CLEAR LIQUID DIET   Foods Allowed                                                                     Foods Excluded  Coffee and tea, regular and decaf                             liquids that you cannot  Plain Jell-O in any flavor                                             see through such as: Fruit ices (not with fruit pulp)                                     milk, soups, orange juice  Iced Popsicles                                    All solid food Carbonated beverages, regular and diet                                    Cranberry, grape and apple juices Sports drinks like Gatorade Lightly seasoned clear broth or consume(fat free) Sugar, honey syrup  Sample Menu Breakfast                                Lunch  Supper Cranberry juice                    Beef broth                            Chicken broth Jell-O                                     Grape juice                           Apple juice Coffee or tea                        Jell-O                                      Popsicle                                                Coffee or tea                        Coffee or tea  _____________________________________________________________________    Incentive Spirometer  An incentive spirometer is a tool that can help keep your lungs clear and active. This tool measures how well you are filling your lungs with each breath. Taking long deep breaths may help reverse or decrease the chance of developing breathing (pulmonary) problems (especially infection) following:  A long period of time when you are unable to move or be active. BEFORE THE PROCEDURE   If the spirometer includes an indicator to show your best effort, your nurse or respiratory therapist will set it to a desired goal.  If possible, sit up straight or lean  slightly forward. Try not to slouch.  Hold the incentive spirometer in an upright position. INSTRUCTIONS FOR USE  1. Sit on the edge of your bed if possible, or sit up as far as you can in bed or on a chair. 2. Hold the incentive spirometer in an upright position. 3. Breathe out normally. 4. Place the mouthpiece in your mouth and seal your lips tightly around it. 5. Breathe in slowly and as deeply as possible, raising the piston or the ball toward the top of the column. 6. Hold your breath for 3-5 seconds or for as long as possible. Allow the piston or ball to fall to the bottom of the column. 7. Remove the mouthpiece from your mouth and breathe out normally. 8. Rest for a few seconds and repeat Steps 1 through 7 at least 10 times every 1-2 hours when you are awake. Take your time and take a few normal breaths between deep breaths. 9. The spirometer may include an indicator to show your best effort. Use the indicator as a goal to work toward during each repetition. 10. After each set of 10 deep breaths, practice coughing to be sure your lungs are clear. If you have an incision (the cut made at the time of surgery), support your incision when coughing by placing a pillow or rolled up towels firmly against  it. Once you are able to get out of bed, walk around indoors and cough well. You may stop using the incentive spirometer when instructed by your caregiver.  RISKS AND COMPLICATIONS  Take your time so you do not get dizzy or light-headed.  If you are in pain, you may need to take or ask for pain medication before doing incentive spirometry. It is harder to take a deep breath if you are having pain. AFTER USE  Rest and breathe slowly and easily.  It can be helpful to keep track of a log of your progress. Your caregiver can provide you with a simple table to help with this. If you are using the spirometer at home, follow these instructions: [place] IF:   You are having difficultly  using the spirometer.  You have trouble using the spirometer as often as instructed.  Your pain medication is not giving enough relief while using the spirometer.  You develop fever of 100.5 F (38.1 C) or higher. SEEK IMMEDIATE MEDICAL CARE IF:   You cough up bloody sputum that had not been present before.  You develop fever of 102 F (38.9 C) or greater.  You develop worsening pain at or near the incision site. MAKE SURE YOU:   Understand these instructions.  Will watch your condition.  Will get help right away if you are not doing well or get worse. Document Released: 11/20/2006 Document Revised: 10/02/2011 Document Reviewed: 01/21/2007 Exit[employer] Patient Information 2014 [employer], [place].   ________________________________________________________________________  WHAT IS A BLOOD TRANSFUSION? Blood Transfusion Information  A transfusion is the replacement of blood or some of its parts. Blood is made up of multiple cells which provide different functions.  Red blood cells carry oxygen and are used for blood loss replacement.  White blood cells fight against infection.  Platelets control bleeding.  Plasma helps clot blood.  Other blood products are available for specialized needs, such as hemophilia or other clotting disorders. BEFORE THE TRANSFUSION  Who gives blood for transfusions?   Healthy volunteers who are fully evaluated to make sure their blood is safe. This is blood bank blood. Transfusion therapy is the safest it has ever been in the practice of medicine. Before blood is taken from a donor, a complete history is taken to make sure that person has no history of diseases nor engages in risky social behavior (examples are intravenous drug use or sexual activity with multiple partners). The donor's travel history is screened to minimize risk of transmitting infections, such as malaria. The donated blood is tested for signs of infectious diseases, such as HIV and  hepatitis. The blood is then tested to be sure it is compatible with you in order to minimize the chance of a transfusion reaction. If you or a relative donates blood, this is often done in anticipation of surgery and is not appropriate for emergency situations. It takes many days to process the donated blood. RISKS AND COMPLICATIONS Although transfusion therapy is very safe and saves many lives, the main dangers of transfusion include:   Getting an infectious disease.  Developing a transfusion reaction. This is an allergic reaction to something in the blood you were given. Every precaution is taken to prevent this. The decision to have a blood transfusion has been considered carefully by your caregiver before blood is given. Blood is not given unless the benefits outweigh the risks. AFTER THE TRANSFUSION  Right after receiving a blood transfusion, you will usually feel much better and more  energetic. This is especially true if your red blood cells have gotten low (anemic). The transfusion raises the level of the red blood cells which carry oxygen, and this usually causes an energy increase.  The nurse administering the transfusion will monitor you carefully for complications. HOME CARE INSTRUCTIONS  No special instructions are needed after a transfusion. You may find your energy is better. Speak with your caregiver about any limitations on activity for underlying diseases you may have. SEEK MEDICAL CARE IF:   Your condition is not improving after your transfusion.  You develop redness or irritation at the intravenous (IV) site. SEEK IMMEDIATE MEDICAL CARE IF:  Any of the following symptoms occur over the next 12 hours:  Shaking chills.  You have a temperature by mouth above 102 F (38.9 C), not controlled by medicine.  Chest, back, or muscle pain.  People around you feel you are not acting correctly or are confused.  Shortness of breath or difficulty breathing.  Dizziness and  fainting.  You get a rash or develop hives.  You have a decrease in urine output.  Your urine turns a dark color or changes to pink, red, or brown. Any of the following symptoms occur over the next 10 days:  You have a temperature by mouth above 102 F (38.9 C), not controlled by medicine.  Shortness of breath.  Weakness after normal activity.  The white part of the eye turns yellow (jaundice).  You have a decrease in the amount of urine or are urinating less often.  Your urine turns a dark color or changes to pink, red, or brown. Document Released: 07/07/2000 Document Revised: 10/02/2011 Document Reviewed: 02/24/2008 [facility] Patient Information 2014 [place], [place].  _______________________________________________________________________

## 2014-04-23 NOTE — Progress Notes (Signed)
EKG per [facility] 04/06/2014  Office Visit Note per Alysia Penna MD in [facility] 04/06/2014 [facility] pulmonary MD [facility]

## 2014-04-24 ENCOUNTER — Ambulatory Visit (INDEPENDENT_AMBULATORY_CARE_PROVIDER_SITE_OTHER): Payer: Managed Care, Other (non HMO)

## 2014-04-24 DIAGNOSIS — J309 Allergic rhinitis, unspecified: Secondary | ICD-10-CM

## 2014-04-26 LAB — MRSA CULTURE

## 2014-04-26 NOTE — H&P (Signed)
TOTAL KNEE ADMISSION H&P  Patient is being admitted for left total knee arthroplasty.  Subjective:  Chief Complaint:   Left knee OA / pain.  HPI: Gabriela Shepherd, 58 y.o. female, has a history of pain and functional disability in the left knee due to arthritis and has failed non-surgical conservative treatments for greater than 12 weeks to includeNSAID's and/or analgesics, corticosteriod injections, use of assistive devices and activity modification.  Onset of symptoms was gradual, starting 2+ years ago with gradually worsening course since that time. The patient noted no past surgery on the left knee(s).  Patient currently rates pain in the left knee(s) at 8 out of 10 with activity. Patient has night pain, worsening of pain with activity and weight bearing, pain that interferes with activities of daily living, pain with passive range of motion, crepitus and joint swelling.  Patient has evidence of periarticular osteophytes and joint space narrowing by imaging studies.  There is no active infection.  Risks, benefits and expectations were discussed with the patient.  Risks including but not limited to the risk of anesthesia, blood clots, nerve damage, blood vessel damage, failure of the prosthesis, infection and up to and including death.  Patient understand the risks, benefits and expectations and wishes to proceed with surgery.   PCP: Laurey Morale, MD  D/C Plans:      Home with HHPT  Post-op Meds:       No Rx given   Tranexamic Acid:      To be given - IV    Decadron:      Is to be given  FYI:     ASA post-op  Norco post-op    Patient Active Problem List   Diagnosis Date Noted  . Allergic rhinitis due to pollen 09/29/2010  . ACUTE SINUSITIS, UNSPECIFIED 06/15/2009  . ANXIETY 08/30/2007  . HYPERTENSION 08/30/2007  . Allergic-infective asthma 08/30/2007  . GERD 08/30/2007  . OSTEOARTHRITIS 08/30/2007  . HEADACHE 08/30/2007  . ANXIETY DISORDER, GENERALIZED 03/15/2007  . 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 Anxiety   . Asthma     sees Dr. Annamaria Boots  . GERD (gastroesophageal reflux disease)   . Hypertension   . Allergic rhinitis     sees Dr. Annamaria Boots  . Osteoarthritis   . Carpal tunnel syndrome     sees Dr. Amedeo Plenty  . Gynecological examination     sees Dr. Elyse Hsu  . Headache(784.0)     hx of pt states BP meds has improved     Past Surgical History  Procedure Laterality Date  . Bunionectomy    . Colonoscopy  11-27-08    per Dr. Olevia Perches, hyperplastic polyps, repeat in 7 yrs   . Hysteroscopic polyp resection    . Knee arthroscopy    . Tubal ligation    . Refractive surgery      related to hole in back of left eye as stated per pt     No prescriptions prior to admission   Allergies  Allergen Reactions  . Amoxicillin     REACTION: rash, itching    History  Substance Use Topics  . Smoking status: Never Smoker   . Smokeless tobacco: Never Used  . Alcohol Use: Yes     Comment: occ    Family History  Problem Relation Age of Onset  . Liver cancer Mother     deceased   . Hypertension Mother   .  Heart disease Father   . Hypertension Father   . Diabetes Maternal Grandmother   . Cancer Maternal Aunt      Review of Systems  Constitutional: Negative.   Eyes: Negative.   Respiratory: Negative.   Cardiovascular: Negative.   Gastrointestinal: Positive for heartburn.  Genitourinary: Negative.   Musculoskeletal: Positive for joint pain.  Skin: Negative.   Neurological: Positive for headaches.  Endo/Heme/Allergies: Positive for environmental allergies.  Psychiatric/Behavioral: The patient is nervous/anxious.     Objective:  Physical Exam  Constitutional: She is oriented to person, place, and time. She appears well-developed and well-nourished.  HENT:  Head: Normocephalic and atraumatic.  Eyes: Pupils are equal, round, and reactive to light.  Neck: Neck supple. No JVD present. No  tracheal deviation present. No thyromegaly present.  Cardiovascular: Normal rate, regular rhythm, normal heart sounds and intact distal pulses.   Respiratory: Effort normal and breath sounds normal. No respiratory distress. She has no wheezes.  GI: Soft. There is no tenderness. There is no guarding.  Musculoskeletal:       Left knee: She exhibits decreased range of motion, swelling and bony tenderness. She exhibits no ecchymosis, no deformity, no laceration and no erythema. Tenderness found.  Lymphadenopathy:    She has no cervical adenopathy.  Neurological: She is alert and oriented to person, place, and time.  Skin: Skin is warm and dry.  Psychiatric: She has a normal mood and affect.    Labs:  Estimated body mass index is 42.28 kg/(m^2) as calculated from the following:   Height as of 01/16/14: 5\' 7"  (1.702 m).   Weight as of 01/16/14: 122.471 kg (270 lb).   Imaging Review Plain radiographs demonstrate severe degenerative joint disease of the left knee(s). The overall alignment is neutral. The bone quality appears to be good for age and reported activity level.  Assessment/Plan:  End stage arthritis, left knee   The patient history, physical examination, clinical judgment of the provider and imaging studies are consistent with end stage degenerative joint disease of the left knee(s) and total knee arthroplasty is deemed medically necessary. The treatment options including medical management, injection therapy arthroscopy and arthroplasty were discussed at length. The risks and benefits of total knee arthroplasty were presented and reviewed. The risks due to aseptic loosening, infection, stiffness, patella tracking problems, thromboembolic complications and other imponderables were discussed. The patient acknowledged the explanation, agreed to proceed with the plan and consent was signed. Patient is being admitted for inpatient treatment for surgery, pain control, PT, OT, prophylactic  antibiotics, VTE prophylaxis, progressive ambulation and ADL's and discharge planning. The patient is planning to be discharged home with home health services.    West Pugh Kriss Perleberg   PA-C  04/26/2014, 3:13 PM

## 2014-05-01 ENCOUNTER — Ambulatory Visit (INDEPENDENT_AMBULATORY_CARE_PROVIDER_SITE_OTHER): Payer: Managed Care, Other (non HMO)

## 2014-05-01 DIAGNOSIS — J309 Allergic rhinitis, unspecified: Secondary | ICD-10-CM

## 2014-05-04 ENCOUNTER — Encounter (HOSPITAL_COMMUNITY)
Admission: RE | Disposition: A | Discharge status: Home Health | Payer: Self-pay | Source: Ambulatory Visit | Attending: Orthopedic Surgery

## 2014-05-04 ENCOUNTER — Encounter (HOSPITAL_COMMUNITY): Payer: Managed Care, Other (non HMO) | Admitting: Anesthesiology

## 2014-05-04 ENCOUNTER — Inpatient Hospital Stay (HOSPITAL_COMMUNITY)
Admission: RE | Admit: 2014-05-04 | Discharge: 2014-05-05 | DRG: 470 | Disposition: A | Discharge status: Home Health | Payer: Managed Care, Other (non HMO) | Source: Ambulatory Visit | Attending: Orthopedic Surgery | Admitting: Orthopedic Surgery

## 2014-05-04 ENCOUNTER — Inpatient Hospital Stay (HOSPITAL_COMMUNITY): Payer: Managed Care, Other (non HMO) | Admitting: Anesthesiology

## 2014-05-04 ENCOUNTER — Encounter (HOSPITAL_COMMUNITY): Payer: Self-pay | Admitting: Anesthesiology

## 2014-05-04 DIAGNOSIS — Z8249 Family history of ischemic heart disease and other diseases of the circulatory system: Secondary | ICD-10-CM | POA: Diagnosis not present

## 2014-05-04 DIAGNOSIS — Z833 Family history of diabetes mellitus: Secondary | ICD-10-CM | POA: Diagnosis not present

## 2014-05-04 DIAGNOSIS — M179 Osteoarthritis of knee, unspecified: Principal | ICD-10-CM | POA: Diagnosis present

## 2014-05-04 DIAGNOSIS — K219 Gastro-esophageal reflux disease without esophagitis: Secondary | ICD-10-CM | POA: Diagnosis present

## 2014-05-04 DIAGNOSIS — Z6841 Body Mass Index (BMI) 40.0 and over, adult: Secondary | ICD-10-CM | POA: Diagnosis not present

## 2014-05-04 DIAGNOSIS — Z96652 Presence of left artificial knee joint: Secondary | ICD-10-CM

## 2014-05-04 DIAGNOSIS — J45909 Unspecified asthma, uncomplicated: Secondary | ICD-10-CM | POA: Diagnosis present

## 2014-05-04 DIAGNOSIS — M25562 Pain in left knee: Secondary | ICD-10-CM | POA: Diagnosis present

## 2014-05-04 DIAGNOSIS — M659 Synovitis and tenosynovitis, unspecified: Secondary | ICD-10-CM | POA: Diagnosis present

## 2014-05-04 HISTORY — PX: TOTAL KNEE ARTHROPLASTY: SHX125

## 2014-05-04 LAB — TYPE AND SCREEN
ABO/RH(D): B POS
ANTIBODY SCREEN: NEGATIVE

## 2014-05-04 LAB — ABO/RH: ABO/RH(D): B POS

## 2014-05-04 SURGERY — ARTHROPLASTY, KNEE, TOTAL
Anesthesia: Spinal | Site: Knee | Laterality: Left

## 2014-05-04 MED ORDER — 0.9 % SODIUM CHLORIDE (POUR BTL) OPTIME
TOPICAL | Status: DC | PRN
  Administered 2014-05-04: 1000 mL

## 2014-05-04 MED ORDER — LACTATED RINGERS IV SOLN
INTRAVENOUS | Status: DC | PRN
  Administered 2014-05-04 (×3): via INTRAVENOUS

## 2014-05-04 MED ORDER — POLYETHYLENE GLYCOL 3350 17 G PO PACK
17.0000 g | PACK | Freq: Two times a day (BID) | ORAL | Status: DC
  Administered 2014-05-04 – 2014-05-05 (×2): 17 g via ORAL

## 2014-05-04 MED ORDER — METHOCARBAMOL 500 MG PO TABS
500.0000 mg | ORAL_TABLET | Freq: Four times a day (QID) | ORAL | Status: DC | PRN
  Administered 2014-05-05 (×3): 500 mg via ORAL
  Filled 2014-05-04 (×3): qty 1

## 2014-05-04 MED ORDER — MIDAZOLAM HCL 2 MG/2ML IJ SOLN
INTRAMUSCULAR | Status: AC
  Filled 2014-05-04: qty 2

## 2014-05-04 MED ORDER — VERAPAMIL HCL 120 MG PO TABS
120.0000 mg | ORAL_TABLET | Freq: Every day | ORAL | Status: DC
  Filled 2014-05-04 (×2): qty 1

## 2014-05-04 MED ORDER — MENTHOL 3 MG MT LOZG
1.0000 | LOZENGE | OROMUCOSAL | Status: DC | PRN
  Filled 2014-05-04: qty 9

## 2014-05-04 MED ORDER — CLINDAMYCIN PHOSPHATE 600 MG/50ML IV SOLN
600.0000 mg | Freq: Four times a day (QID) | INTRAVENOUS | Status: AC
Start: 2014-05-04 — End: 2014-05-05
  Administered 2014-05-04 – 2014-05-05 (×2): 600 mg via INTRAVENOUS
  Filled 2014-05-04 (×2): qty 50

## 2014-05-04 MED ORDER — HYDROMORPHONE HCL 1 MG/ML IJ SOLN
0.5000 mg | INTRAMUSCULAR | Status: DC | PRN
  Administered 2014-05-04: 1 mg via INTRAVENOUS
  Filled 2014-05-04: qty 1

## 2014-05-04 MED ORDER — BUPIVACAINE-EPINEPHRINE 0.5% -1:200000 IJ SOLN
INTRAMUSCULAR | Status: AC
  Filled 2014-05-04: qty 1

## 2014-05-04 MED ORDER — KETOROLAC TROMETHAMINE 30 MG/ML IJ SOLN
INTRAMUSCULAR | Status: AC
  Filled 2014-05-04: qty 1

## 2014-05-04 MED ORDER — SODIUM CHLORIDE 0.9 % IV SOLN
1000.0000 mg | Freq: Once | INTRAVENOUS | Status: AC
  Administered 2014-05-04: 1000 mg via INTRAVENOUS
  Filled 2014-05-04: qty 10

## 2014-05-04 MED ORDER — PROPOFOL 10 MG/ML IV BOLUS
INTRAVENOUS | Status: AC
  Filled 2014-05-04: qty 20

## 2014-05-04 MED ORDER — MAGNESIUM CITRATE PO SOLN
1.0000 | Freq: Once | ORAL | Status: AC | PRN
Start: 2014-05-04 — End: 2014-05-04

## 2014-05-04 MED ORDER — KETOROLAC TROMETHAMINE 30 MG/ML IJ SOLN
INTRAMUSCULAR | Status: DC | PRN
  Administered 2014-05-04: 30 mg via INTRAVENOUS

## 2014-05-04 MED ORDER — DEXAMETHASONE SODIUM PHOSPHATE 10 MG/ML IJ SOLN
10.0000 mg | Freq: Once | INTRAMUSCULAR | Status: AC
  Administered 2014-05-04: 10 mg via INTRAVENOUS

## 2014-05-04 MED ORDER — BUPIVACAINE-EPINEPHRINE (PF) 0.25% -1:200000 IJ SOLN
INTRAMUSCULAR | Status: DC | PRN
  Administered 2014-05-04: 30 mL

## 2014-05-04 MED ORDER — DOCUSATE SODIUM 100 MG PO CAPS
100.0000 mg | ORAL_CAPSULE | Freq: Two times a day (BID) | ORAL | Status: DC
  Administered 2014-05-04 – 2014-05-05 (×2): 100 mg via ORAL

## 2014-05-04 MED ORDER — FERROUS SULFATE 325 (65 FE) MG PO TABS
325.0000 mg | ORAL_TABLET | Freq: Three times a day (TID) | ORAL | Status: DC
  Administered 2014-05-05 (×2): 325 mg via ORAL
  Filled 2014-05-04 (×5): qty 1

## 2014-05-04 MED ORDER — METHOCARBAMOL 1000 MG/10ML IJ SOLN
500.0000 mg | Freq: Four times a day (QID) | INTRAMUSCULAR | Status: DC | PRN
  Administered 2014-05-04: 500 mg via INTRAVENOUS
  Filled 2014-05-04: qty 5

## 2014-05-04 MED ORDER — METOCLOPRAMIDE HCL 10 MG PO TABS: 5.0000 mg | ORAL_TABLET | Freq: Three times a day (TID) | ORAL | Status: DC | PRN

## 2014-05-04 MED ORDER — MIDAZOLAM HCL 2 MG/2ML IJ SOLN
INTRAMUSCULAR | Status: AC
Start: 2014-05-04 — End: 2014-05-04
  Filled 2014-05-04: qty 2

## 2014-05-04 MED ORDER — ONDANSETRON HCL 4 MG/2ML IJ SOLN: 4.0000 mg | Freq: Four times a day (QID) | INTRAMUSCULAR | Status: DC | PRN

## 2014-05-04 MED ORDER — ALBUTEROL SULFATE (2.5 MG/3ML) 0.083% IN NEBU: 2.5000 mg | INHALATION_SOLUTION | Freq: Four times a day (QID) | RESPIRATORY_TRACT | Status: DC | PRN

## 2014-05-04 MED ORDER — SODIUM CHLORIDE 0.9 % IR SOLN
Status: DC | PRN
  Administered 2014-05-04: 1000 mL

## 2014-05-04 MED ORDER — DIPHENHYDRAMINE HCL 25 MG PO CAPS: 25.0000 mg | ORAL_CAPSULE | Freq: Four times a day (QID) | ORAL | Status: DC | PRN

## 2014-05-04 MED ORDER — VALSARTAN-HYDROCHLOROTHIAZIDE 160-25 MG PO TABS: 1.0000 | ORAL_TABLET | ORAL | Status: DC

## 2014-05-04 MED ORDER — FENTANYL CITRATE 0.05 MG/ML IJ SOLN
INTRAMUSCULAR | Status: DC | PRN
  Administered 2014-05-04: 100 ug via INTRAVENOUS

## 2014-05-04 MED ORDER — SODIUM CHLORIDE 0.9 % IV SOLN
INTRAVENOUS | Status: DC
  Administered 2014-05-04 – 2014-05-05 (×2): via INTRAVENOUS
  Filled 2014-05-04 (×5): qty 1000

## 2014-05-04 MED ORDER — SODIUM CHLORIDE 0.9 % IJ SOLN
INTRAMUSCULAR | Status: AC
  Filled 2014-05-04: qty 10

## 2014-05-04 MED ORDER — ONDANSETRON HCL 4 MG/2ML IJ SOLN
INTRAMUSCULAR | Status: DC | PRN
  Administered 2014-05-04: 4 mg via INTRAVENOUS

## 2014-05-04 MED ORDER — HYDROCHLOROTHIAZIDE 25 MG PO TABS
25.0000 mg | ORAL_TABLET | ORAL | Status: DC
  Filled 2014-05-04 (×2): qty 1

## 2014-05-04 MED ORDER — PHENYLEPHRINE 40 MCG/ML (10ML) SYRINGE FOR IV PUSH (FOR BLOOD PRESSURE SUPPORT)
PREFILLED_SYRINGE | INTRAVENOUS | Status: AC
  Filled 2014-05-04: qty 10

## 2014-05-04 MED ORDER — PHENYLEPHRINE HCL 10 MG/ML IJ SOLN
INTRAMUSCULAR | Status: DC | PRN
  Administered 2014-05-04 (×3): 80 ug via INTRAVENOUS
  Administered 2014-05-04 (×3): 40 ug via INTRAVENOUS

## 2014-05-04 MED ORDER — BUPIVACAINE-EPINEPHRINE (PF) 0.25% -1:200000 IJ SOLN
INTRAMUSCULAR | Status: AC
  Filled 2014-05-04: qty 30

## 2014-05-04 MED ORDER — CLINDAMYCIN PHOSPHATE 900 MG/50ML IV SOLN
900.0000 mg | INTRAVENOUS | Status: AC
  Administered 2014-05-04: 900 mg via INTRAVENOUS

## 2014-05-04 MED ORDER — PHENOL 1.4 % MT LIQD
1.0000 | OROMUCOSAL | Status: DC | PRN
  Filled 2014-05-04: qty 177

## 2014-05-04 MED ORDER — VERAPAMIL HCL 120 MG PO TABS: 120.0000 mg | ORAL_TABLET | ORAL | Status: DC

## 2014-05-04 MED ORDER — PROMETHAZINE HCL 25 MG/ML IJ SOLN: 6.2500 mg | INTRAMUSCULAR | Status: DC | PRN

## 2014-05-04 MED ORDER — HYDROMORPHONE HCL 1 MG/ML IJ SOLN: 0.2500 mg | INTRAMUSCULAR | Status: DC | PRN

## 2014-05-04 MED ORDER — SODIUM CHLORIDE 0.9 % IJ SOLN
INTRAMUSCULAR | Status: DC | PRN
  Administered 2014-05-04: 9 mL via INTRAVENOUS

## 2014-05-04 MED ORDER — MIDAZOLAM HCL 5 MG/5ML IJ SOLN
INTRAMUSCULAR | Status: DC | PRN
  Administered 2014-05-04 (×2): 2 mg via INTRAVENOUS

## 2014-05-04 MED ORDER — ONDANSETRON HCL 4 MG/2ML IJ SOLN
INTRAMUSCULAR | Status: AC
Start: 2014-05-04 — End: 2014-05-04
  Filled 2014-05-04: qty 2

## 2014-05-04 MED ORDER — HYDROCODONE-ACETAMINOPHEN 7.5-325 MG PO TABS
1.0000 | ORAL_TABLET | ORAL | Status: DC
  Administered 2014-05-04: 1 via ORAL
  Administered 2014-05-04 – 2014-05-05 (×5): 2 via ORAL
  Filled 2014-05-04 (×2): qty 2
  Filled 2014-05-04: qty 1
  Filled 2014-05-04 (×3): qty 2

## 2014-05-04 MED ORDER — PROPOFOL INFUSION 10 MG/ML OPTIME
INTRAVENOUS | Status: DC | PRN
  Administered 2014-05-04: 80 ug/kg/min via INTRAVENOUS

## 2014-05-04 MED ORDER — BUPIVACAINE HCL (PF) 0.75 % IJ SOLN
INTRAMUSCULAR | Status: DC | PRN
  Administered 2014-05-04: 15 mg

## 2014-05-04 MED ORDER — BISACODYL 10 MG RE SUPP: 10.0000 mg | Freq: Every day | RECTAL | Status: DC | PRN

## 2014-05-04 MED ORDER — METOCLOPRAMIDE HCL 5 MG/ML IJ SOLN: 5.0000 mg | Freq: Three times a day (TID) | INTRAMUSCULAR | Status: DC | PRN

## 2014-05-04 MED ORDER — ASPIRIN EC 325 MG PO TBEC
325.0000 mg | DELAYED_RELEASE_TABLET | Freq: Two times a day (BID) | ORAL | Status: DC
  Administered 2014-05-05: 325 mg via ORAL
  Filled 2014-05-04 (×3): qty 1

## 2014-05-04 MED ORDER — FENTANYL CITRATE 0.05 MG/ML IJ SOLN
INTRAMUSCULAR | Status: AC
  Filled 2014-05-04: qty 2

## 2014-05-04 MED ORDER — PANTOPRAZOLE SODIUM 40 MG PO TBEC
40.0000 mg | DELAYED_RELEASE_TABLET | ORAL | Status: DC
  Administered 2014-05-05: 40 mg via ORAL
  Filled 2014-05-04 (×2): qty 1

## 2014-05-04 MED ORDER — ALUM & MAG HYDROXIDE-SIMETH 200-200-20 MG/5ML PO SUSP
30.0000 mL | ORAL | Status: DC | PRN
Start: 2014-05-04 — End: 2014-05-05

## 2014-05-04 MED ORDER — LACTATED RINGERS IV SOLN: INTRAVENOUS | Status: DC

## 2014-05-04 MED ORDER — DEXAMETHASONE SODIUM PHOSPHATE 10 MG/ML IJ SOLN
INTRAMUSCULAR | Status: AC
Start: 2014-05-04 — End: 2014-05-04
  Filled 2014-05-04: qty 1

## 2014-05-04 MED ORDER — CELECOXIB 200 MG PO CAPS
200.0000 mg | ORAL_CAPSULE | Freq: Two times a day (BID) | ORAL | Status: DC
  Administered 2014-05-04 – 2014-05-05 (×2): 200 mg via ORAL
  Filled 2014-05-04 (×3): qty 1

## 2014-05-04 MED ORDER — CHLORHEXIDINE GLUCONATE 4 % EX LIQD: 60.0000 mL | Freq: Once | CUTANEOUS | Status: DC

## 2014-05-04 MED ORDER — CLINDAMYCIN PHOSPHATE 900 MG/50ML IV SOLN
INTRAVENOUS | Status: AC
  Filled 2014-05-04: qty 50

## 2014-05-04 MED ORDER — IRBESARTAN 150 MG PO TABS
150.0000 mg | ORAL_TABLET | ORAL | Status: DC
  Filled 2014-05-04 (×2): qty 1

## 2014-05-04 MED ORDER — DEXAMETHASONE SODIUM PHOSPHATE 10 MG/ML IJ SOLN
10.0000 mg | Freq: Once | INTRAMUSCULAR | Status: AC
  Administered 2014-05-05: 10 mg via INTRAVENOUS
  Filled 2014-05-04: qty 1

## 2014-05-04 MED ORDER — ONDANSETRON HCL 4 MG PO TABS: 4.0000 mg | ORAL_TABLET | Freq: Four times a day (QID) | ORAL | Status: DC | PRN

## 2014-05-04 SURGICAL SUPPLY — 49 items
BAG ZIPLOCK 12X15 (MISCELLANEOUS) IMPLANT
BANDAGE ELASTIC 6 VELCRO ST LF (GAUZE/BANDAGES/DRESSINGS) ×2 IMPLANT
BANDAGE ESMARK 6X9 LF (GAUZE/BANDAGES/DRESSINGS) ×1 IMPLANT
BLADE SAW SGTL 13.0X1.19X90.0M (BLADE) ×2 IMPLANT
BNDG ESMARK 6X9 LF (GAUZE/BANDAGES/DRESSINGS) ×2
BOWL SMART MIX CTS (DISPOSABLE) ×2 IMPLANT
CAP KNEE ATTUNE RP ×2 IMPLANT
CEMENT HV SMART SET (Cement) ×4 IMPLANT
CUFF TOURN SGL QUICK 34 (TOURNIQUET CUFF) ×1
CUFF TRNQT CYL 34X4X40X1 (TOURNIQUET CUFF) ×1 IMPLANT
DERMABOND ADVANCED (GAUZE/BANDAGES/DRESSINGS) ×1
DERMABOND ADVANCED .7 DNX12 (GAUZE/BANDAGES/DRESSINGS) ×1 IMPLANT
DRAPE EXTREMITY TIBURON (DRAPES) ×2 IMPLANT
DRAPE POUCH INSTRU U-SHP 10X18 (DRAPES) ×2 IMPLANT
DRAPE U-SHAPE 47X51 STRL (DRAPES) ×2 IMPLANT
DRSG AQUACEL AG ADV 3.5X10 (GAUZE/BANDAGES/DRESSINGS) ×2 IMPLANT
DURAPREP 26ML APPLICATOR (WOUND CARE) ×4 IMPLANT
ELECT REM PT RETURN 9FT ADLT (ELECTROSURGICAL) ×2
ELECTRODE REM PT RTRN 9FT ADLT (ELECTROSURGICAL) ×1 IMPLANT
FACESHIELD WRAPAROUND (MASK) ×8 IMPLANT
GLOVE BIOGEL PI IND STRL 7.5 (GLOVE) ×1 IMPLANT
GLOVE BIOGEL PI IND STRL 8.5 (GLOVE) ×1 IMPLANT
GLOVE BIOGEL PI INDICATOR 7.5 (GLOVE) ×1
GLOVE BIOGEL PI INDICATOR 8.5 (GLOVE) ×1
GLOVE ECLIPSE 8.0 STRL XLNG CF (GLOVE) ×2 IMPLANT
GLOVE ORTHO TXT STRL SZ7.5 (GLOVE) ×4 IMPLANT
GOWN SPEC L3 XXLG W/TWL (GOWN DISPOSABLE) ×2 IMPLANT
GOWN STRL REUS W/TWL LRG LVL3 (GOWN DISPOSABLE) ×2 IMPLANT
HANDPIECE INTERPULSE COAX TIP (DISPOSABLE) ×1
KIT BASIN OR (CUSTOM PROCEDURE TRAY) ×2 IMPLANT
MANIFOLD NEPTUNE II (INSTRUMENTS) ×2 IMPLANT
NDL SAFETY ECLIPSE 18X1.5 (NEEDLE) ×1 IMPLANT
NEEDLE HYPO 18GX1.5 SHARP (NEEDLE) ×1
PACK TOTAL JOINT (CUSTOM PROCEDURE TRAY) ×2 IMPLANT
POSITIONER SURGICAL ARM (MISCELLANEOUS) ×2 IMPLANT
SET HNDPC FAN SPRY TIP SCT (DISPOSABLE) ×1 IMPLANT
SET PAD KNEE POSITIONER (MISCELLANEOUS) ×2 IMPLANT
SUCTION FRAZIER 12FR DISP (SUCTIONS) ×2 IMPLANT
SUT MNCRL AB 4-0 PS2 18 (SUTURE) ×2 IMPLANT
SUT VIC AB 1 CT1 36 (SUTURE) ×2 IMPLANT
SUT VIC AB 2-0 CT1 27 (SUTURE) ×3
SUT VIC AB 2-0 CT1 TAPERPNT 27 (SUTURE) ×3 IMPLANT
SUT VLOC 180 0 24IN GS25 (SUTURE) ×2 IMPLANT
SYR 50ML LL SCALE MARK (SYRINGE) ×2 IMPLANT
TOWEL OR 17X26 10 PK STRL BLUE (TOWEL DISPOSABLE) ×2 IMPLANT
TOWEL OR NON WOVEN STRL DISP B (DISPOSABLE) IMPLANT
TRAY FOLEY CATH 14FRSI W/METER (CATHETERS) ×2 IMPLANT
WATER STERILE IRR 1500ML POUR (IV SOLUTION) ×2 IMPLANT
WRAP KNEE MAXI GEL POST OP (GAUZE/BANDAGES/DRESSINGS) ×2 IMPLANT

## 2014-05-04 NOTE — Transfer of Care (Signed)
Immediate Anesthesia Transfer of Care Note  Patient: Gabriela Shepherd  Procedure(s) Performed: Procedure(s): LEFT TOTAL KNEE ARTHROPLASTY (Left)  Patient Location: PACU  Anesthesia Type:Spinal  Level of Consciousness: awake, alert  and oriented  Airway & Oxygen Therapy: Patient Spontanous Breathing and Patient connected to face mask oxygen  Post-op Assessment: Report given to PACU RN and Post -op Vital signs reviewed and stable  Post vital signs: Reviewed and stable  Complications: No apparent anesthesia complications

## 2014-05-04 NOTE — Anesthesia Procedure Notes (Addendum)
Spinal  Patient location during procedure: OR Start time: 05/04/2014 12:50 PM End time: 05/04/2014 12:55 PM Staffing CRNA/Resident: Harle Stanford R Performed by: resident/CRNA  Preanesthetic Checklist Completed: patient identified, site marked, surgical consent, pre-op evaluation, timeout performed, IV checked, risks and benefits discussed and monitors and equipment checked Spinal Block Patient position: sitting Prep: Betadine Patient monitoring: heart rate, cardiac monitor, continuous pulse ox and blood pressure Approach: right paramedian Location: L3-4 Injection technique: single-shot Needle Needle type: Sprotte  Needle gauge: 24 G Needle length: 10 cm Needle insertion depth: 8 cm Assessment Sensory level: T4

## 2014-05-04 NOTE — Anesthesia Postprocedure Evaluation (Signed)
  Anesthesia Post-op Note  Patient: Gabriela Shepherd  Procedure(s) Performed: Procedure(s) (LRB): LEFT TOTAL KNEE ARTHROPLASTY (Left)  Patient Location: PACU  Anesthesia Type: Spinal  Level of Consciousness: awake and alert   Airway and Oxygen Therapy: Patient Spontanous Breathing  Post-op Pain: mild  Post-op Assessment: Post-op Vital signs reviewed, Patient's Cardiovascular Status Stable, Respiratory Function Stable, Patent Airway and No signs of Nausea or vomiting  Last Vitals:  Filed Vitals:   05/04/14 1615  BP: 131/61  Pulse: 54  Temp: 36.7 C  Resp: 17    Post-op Vital Signs: stable   Complications: No apparent anesthesia complications

## 2014-05-04 NOTE — Op Note (Signed)
NAME:  Gabriela Shepherd                      MEDICAL RECORD NO.:  [id number]                             FACILITY:  [facility]      PHYSICIAN:  Pietro Cassis. Alvan Dame, M.D.  DATE OF BIRTH:  [birth year]      DATE OF PROCEDURE:  05/04/2014                                     OPERATIVE REPORT         PREOPERATIVE DIAGNOSIS:  Left knee osteoarthritis.      POSTOPERATIVE DIAGNOSIS:  Left knee osteoarthritis.      FINDINGS:  The patient was noted to have complete loss of cartilage and   bone-on-bone arthritis with associated osteophytes in the medial and patellofemoral compartments of   the knee with a significant synovitis and associated effusion.      PROCEDURE:  Left total knee replacement.      COMPONENTS USED:  DePuy Attune rotating platform posterior stabilized knee   system, a size 5N femur, 5 tibia, size 6 mm AOX PS insert, and 35 anatomic patellar   button.      SURGEON:  Pietro Cassis. Alvan Dame, M.D.      ASSISTANT:  Danae Orleans, PA-C.      ANESTHESIA:  Spinal.      SPECIMENS:  None.      COMPLICATION:  None.      DRAINS:  None.  EBL: <100cc      TOURNIQUET TIME:   Total Tourniquet Time Documented: Thigh (Left) - 34 minutes Total: Thigh (Left) - 34 minutes      The patient was stable to the recovery room.      INDICATION FOR PROCEDURE:  Gabriela Shepherd is a 58 y.o. female patient of   mine.  The patient had been seen, evaluated, and treated conservatively in the   office with medication, activity modification, and injections.  The patient had   radiographic changes of bone-on-bone arthritis with endplate sclerosis and osteophytes noted.      The patient failed conservative measures including medication, injections, and activity modification, and at this point was ready for more definitive measures.   Based on the radiographic changes and failed conservative measures, the patient   decided to proceed with total knee replacement.  Risks of infection,   DVT, component  failure, need for revision surgery, postop course, and   expectations were all   discussed and reviewed.  Consent was obtained for benefit of pain   relief.      PROCEDURE IN DETAIL:  The patient was brought to the operative theater.   Once adequate anesthesia, preoperative antibiotics, 900mg  of Cleocin administered, the patient was positioned supine with the left thigh tourniquet placed.  The  left lower extremity was prepped and draped in sterile fashion.  A time-   out was performed identifying the patient, planned procedure, and   extremity.      The left lower extremity was placed in the [facility] leg holder.  The leg was   exsanguinated, tourniquet elevated to 250 mmHg.  A midline incision was   made followed by median parapatellar arthrotomy.  Following initial   exposure, attention was  first directed to the patella.  Precut   measurement was noted to be 23 mm.  I resected down to 14 mm and used a   35 patellar button to restore patellar height as well as cover the cut   surface.      The lug holes were drilled and a metal shim was placed to protect the   patella from retractors and saw blades.      At this point, attention was now directed to the femur.  The femoral   canal was opened with a drill, irrigated to try to prevent fat emboli.  An   intramedullary rod was passed at 5 degrees valgus, 9 mm of bone was   resected off the distal femur.  Following this resection, the tibia was   subluxated anteriorly.  Using the extramedullary guide, 9 mm of bone was resected off   the proximal lateral tibia.  We confirmed the gap would be   stable medially and laterally with a size 6 mm insert as well as confirmed   the cut was perpendicular in the coronal plane, checking with an alignment rod.      Once this was done, I sized the femur to be a size 5 in the anterior-   posterior dimension, chose a narrow component based on medial and   lateral dimension.  The size 5 rotation block was  then pinned in   position anterior referenced using the tensioning device to set rotation and best match extension and flexion gaps.  The   anterior, posterior, and  chamfer cuts were made without difficulty nor   notching making certain that I was along the anterior cortex to help   with flexion gap stability.      The final box cut was made off the lateral aspect of distal femur.      At this point, the tibia was sized to be a size 5, the size 5 tray was   then pinned in position through the medial third of the tubercle,   drilled, and keel punched.  Trial reduction was now carried with a 5N femur,  5 tibia, a size 6 mm insert, and the 35 anatomic patella botton.  The knee was brought to   extension, full extension with good flexion stability with the patella   tracking through the trochlea without application of pressure.  Given   all these findings, the trial components removed.  Final components were   opened and cement was mixed.  The knee was irrigated with normal saline   solution and pulse lavage.  The synovial lining was   then injected with 50cc 0.25% Marcaine with epinephrine and 1 cc of Toradol,   total of 51 cc.      The knee was irrigated.  Final implants were then cemented onto clean and   dried cut surfaces of bone with the knee brought to extension with a size 6   mm trial insert.      Once the cement had fully cured, the excess cement was removed   throughout the knee.  I confirmed I was satisfied with the range of   motion and stability, and the final size 6  mm PS AOX insert was chosen.  It was   placed into the knee.      The tourniquet had been let down at 34 minutes.  No significant   hemostasis required.  The   extensor mechanism was then reapproximated using #1 Vicryl  and #0 V-lock with the knee   in flexion.  The   remaining wound was closed with 2-0 Vicryl and running 4-0 Monocryl.   The knee was cleaned, dried, dressed sterilely using Dermabond and    Aquacel dressing.  The patient was then   brought to recovery room in stable condition, tolerating the procedure   well.   Please note that Physician Assistant, Danae Orleans, was present for the entirety of the case, and was utilized for pre-operative positioning, peri-operative retractor management, general facilitation of the procedure.  He was also utilized for primary wound closure at the end of the case.              Pietro Cassis Alvan Dame, M.D.    05/04/2014 2:40 PM

## 2014-05-04 NOTE — Plan of Care (Signed)
Problem: Consults Goal: Diagnosis- Total Joint Replacement Left total knee     

## 2014-05-04 NOTE — Anesthesia Preprocedure Evaluation (Addendum)
Anesthesia Evaluation  Patient identified by MRN, date of birth, ID band Patient awake    Reviewed: Allergy & Precautions, H&P , NPO status , Patient's Chart, lab work & pertinent test results  Airway Mallampati: II TM Distance: >3 FB Neck ROM: Full    Dental no notable dental hx.    Pulmonary asthma ,  breath sounds clear to auscultation  Pulmonary exam normal       Cardiovascular hypertension, Pt. on medications Rhythm:Regular Rate:Normal     Neuro/Psych  Headaches, PSYCHIATRIC DISORDERS Anxiety  Neuromuscular disease    GI/Hepatic Neg liver ROS, GERD-  Medicated,  Endo/Other  negative endocrine ROS  Renal/GU negative Renal ROS  negative genitourinary   Musculoskeletal  (+) Arthritis -,   Abdominal (+) + obese,   Peds negative pediatric ROS (+)  Hematology negative hematology ROS (+)   Anesthesia Other Findings   Reproductive/Obstetrics negative OB ROS                          Anesthesia Physical Anesthesia Plan  ASA: II  Anesthesia Plan: Spinal   Post-op Pain Management:    Induction: Intravenous  Airway Management Planned:   Additional Equipment:   Intra-op Plan:   Post-operative Plan: Extubation in OR  Informed Consent: I have reviewed the patients History and Physical, chart, labs and discussed the procedure including the risks, benefits and alternatives for the proposed anesthesia with the patient or authorized representative who has indicated his/her understanding and acceptance.   Dental advisory given  Plan Discussed with: CRNA  Anesthesia Plan Comments: (Discussed spinal and general. She prefers spinal. Discussed risks/benefits of spinal including headache, backache, failure, bleeding, infection, and nerve damage. Patient consents to spinal. Questions answered. Coagulation studies and platelet count acceptable.)       Anesthesia Quick Evaluation

## 2014-05-04 NOTE — Progress Notes (Signed)
Dr. Delma Post made aware of patient's spinal level- S1- able to move right foot slightly

## 2014-05-04 NOTE — Interval H&P Note (Signed)
History and Physical Interval Note:  05/04/2014 11:49 AM  Gabriela Shepherd  has presented today for surgery, with the diagnosis of LEFT KNEE OA  The various methods of treatment have been discussed with the patient and family. After consideration of risks, benefits and other options for treatment, the patient has consented to  Procedure(s): LEFT TOTAL KNEE ARTHROPLASTY (Left) as a surgical intervention .  The patient's history has been reviewed, patient examined, no change in status, stable for surgery.  I have reviewed the patient's chart and labs.  Questions were answered to the patient's satisfaction.     Mauri Pole

## 2014-05-05 ENCOUNTER — Encounter (HOSPITAL_COMMUNITY): Payer: Self-pay | Admitting: Orthopedic Surgery

## 2014-05-05 LAB — BASIC METABOLIC PANEL
Anion gap: 12 (ref 5–15)
BUN: 19 mg/dL (ref 6–23)
CALCIUM: 8.3 mg/dL — AB (ref 8.4–10.5)
CO2: 21 mEq/L (ref 19–32)
Chloride: 103 mEq/L (ref 96–112)
Creatinine, Ser: 0.65 mg/dL (ref 0.50–1.10)
GFR calc Af Amer: >90 mL/min (ref >90)
Glucose, Bld: 144 mg/dL — ABNORMAL HIGH (ref 70–99)
Potassium: 4.3 mEq/L (ref 3.7–5.3)
Sodium: 136 mEq/L — ABNORMAL LOW (ref 137–147)

## 2014-05-05 LAB — CBC
HCT: 34.8 % — ABNORMAL LOW (ref 36.0–46.0)
HEMOGLOBIN: 11.7 g/dL — AB (ref 12.0–15.0)
MCH: 28.7 pg (ref 26.0–34.0)
MCHC: 33.6 g/dL (ref 30.0–36.0)
MCV: 85.5 fL (ref 78.0–100.0)
Platelets: 197 10*3/uL (ref 150–400)
RBC: 4.07 MIL/uL (ref 3.87–5.11)
RDW: 13.1 % (ref 11.5–15.5)
WBC: 12.1 10*3/uL — ABNORMAL HIGH (ref 4.0–10.5)

## 2014-05-05 MED ORDER — DSS 100 MG PO CAPS: 100.0000 mg | ORAL_CAPSULE | Freq: Two times a day (BID) | ORAL | Status: DC

## 2014-05-05 MED ORDER — POLYETHYLENE GLYCOL 3350 17 G PO PACK: 17.0000 g | PACK | Freq: Two times a day (BID) | ORAL | Status: DC

## 2014-05-05 MED ORDER — HYDROCODONE-ACETAMINOPHEN 7.5-325 MG PO TABS: 1.0000 | ORAL_TABLET | ORAL | Status: DC | PRN

## 2014-05-05 MED ORDER — FERROUS SULFATE 325 (65 FE) MG PO TABS: 325.0000 mg | ORAL_TABLET | Freq: Three times a day (TID) | ORAL | Status: DC

## 2014-05-05 MED ORDER — ASPIRIN 325 MG PO TBEC: 325.0000 mg | DELAYED_RELEASE_TABLET | Freq: Two times a day (BID) | ORAL | Status: AC

## 2014-05-05 MED ORDER — METHOCARBAMOL 500 MG PO TABS: 500.0000 mg | ORAL_TABLET | Freq: Four times a day (QID) | ORAL | Status: DC | PRN

## 2014-05-05 NOTE — Progress Notes (Signed)
Patient ID: Gabriela Shepherd, female   DOB: 24-Jul-1956, 58 y.o.   MRN: [id number] Subjective: 1 Day Post-Op Procedure(s) (LRB): LEFT TOTAL KNEE ARTHROPLASTY (Left)    Patient reports pain as moderate. A bit sleepy this am maybe due to meds.  But ready for therapy, only dangled last night  Objective:   VITALS:   Filed Vitals:   05/05/14 0619  BP: 116/52  Pulse: 52  Temp: 98 F (36.7 C)  Resp: 16    Neurovascular intact Incision: dressing C/D/I  LABS  Recent Labs  05/05/14 0443  HGB 11.7*  HCT 34.8*  WBC 12.1*  PLT 197     Recent Labs  05/05/14 0443  NA 136*  K 4.3  BUN 19  CREATININE 0.65  GLUCOSE 144*    No results found for this basename: LABPT, INR,  in the last 72 hours   Assessment/Plan: 1 Day Post-Op Procedure(s) (LRB): LEFT TOTAL KNEE ARTHROPLASTY (Left)   Advance diet Up with therapy Discharge home with home health after therapy today if she performs well

## 2014-05-05 NOTE — Progress Notes (Signed)
Physical Therapy Treatment Note   05/05/14 1400  PT Visit Information  Last PT Received On 05/05/14  Assistance Needed +1  History of Present Illness Pt is a 58 year old female s/p L TKA  PT Time Calculation  PT Start Time 1344  PT Stop Time 1401  PT Time Calculation (min) 17 min  Subjective Data  Subjective Pt and daughter educated on safe stair technique and pt ambulated again in hallway.  Pt plans to d/c later today and had no further questions.  Precautions  Precautions Knee  Restrictions  Other Position/Activity Restrictions WBAT  Pain Assessment  Pain Assessment 0-10  Pain Score 3  Pain Location L knee  Pain Descriptors / Indicators Aching  Pain Intervention(s) Limited activity within patient's tolerance;Repositioned  Cognition  Arousal/Alertness Awake/alert  Behavior During Therapy WFL for tasks assessed/performed  Overall Cognitive Status Within Functional Limits for tasks assessed  Transfers  Overall transfer level Needs assistance  Equipment used Rolling walker (2 wheeled)  Transfers Sit to/from Stand  Sit to Stand Supervision  General transfer comment verbal cues for hand and LE positioning  Ambulation/Gait  Ambulation/Gait assistance Supervision  Ambulation Distance (Feet) 120 Feet  Assistive device Rolling walker (2 wheeled)  Gait Pattern/deviations Step-to pattern;Antalgic  General Gait Details verbal cues for sequence, RW distance, posture, step length  Stairs Yes  Stairs assistance Min guard  Stair Management Step to pattern;Backwards;With walker  Number of Stairs 4  General stair comments verbal cues for sequence, RW placement, safety, daughter assisted with holding RW  PT - End of Session  Activity Tolerance Patient tolerated treatment well  Patient left in chair;with call bell/phone within reach;with family/visitor present  PT - Assessment/Plan  PT Plan Current plan remains appropriate  PT Frequency 7X/week  Follow Up Recommendations Home health PT   PT equipment Rolling walker with 5" wheels;3in1 (PT)  PT Goal Progression  Progress towards PT goals Progressing toward goals  PT General Charges  $$ ACUTE PT VISIT 1 Procedure  PT Treatments  $Gait Training 8-22 mins   Carmelia Bake, PT, DPT 05/05/2014 Pager: [phone]

## 2014-05-05 NOTE — Evaluation (Signed)
Physical Therapy Evaluation Patient Details Name: Gabriela Shepherd MRN: [id number] DOB: [birth year] Today's Date: 05/05/2014   History of Present Illness  Pt is a 58 year old female s/p L TKA  Clinical Impression  Pt is s/p L TKA resulting in the deficits listed below (see PT Problem List).  Pt will benefit from skilled PT to increase their independence and safety with mobility to allow discharge to the venue listed below.  Pt mobilizing well POD #1 and reports rough night however feeling much better today.  Possible d/c later today.  Will need to practice steps prior to d/c.     Follow Up Recommendations Home health PT    Equipment Recommendations  Rolling walker with 5" wheels;3in1 (PT)    Recommendations for Other Services       Precautions / Restrictions Precautions Precautions: Knee Restrictions Weight Bearing Restrictions: No Other Position/Activity Restrictions: WBAT      Mobility  Bed Mobility Overal bed mobility: Needs Assistance Bed Mobility: Supine to Sit     Supine to sit: Supervision     General bed mobility comments: verbal cues for self assist  Transfers Overall transfer level: Needs assistance Equipment used: Rolling walker (2 wheeled) Transfers: Sit to/from Stand Sit to Stand: Min assist;From elevated surface         General transfer comment: verbal cues for UE and LE positioning, assist to steady upon rise  Ambulation/Gait Ambulation/Gait assistance: Min guard Ambulation Distance (Feet): 120 Feet Assistive device: Rolling walker (2 wheeled) Gait Pattern/deviations: Step-to pattern;Antalgic     General Gait Details: verbal cues for sequence, RW distance, posture, step length  Stairs            Wheelchair Mobility    Modified Rankin (Stroke Patients Only)       Balance                                             Pertinent Vitals/Pain Pain Assessment: 0-10 Pain Score: 5  Pain Location: L knee during  gait Pain Descriptors / Indicators: Aching;Sore Pain Intervention(s): Limited activity within patient's tolerance;Monitored during session;Premedicated before session;Repositioned;Ice applied    Home Living Family/patient expects to be discharged to:: Private residence Living Arrangements: Spouse/significant other Available Help at Discharge: Family Type of Home: House Home Access: Stairs to enter Entrance Stairs-Rails: None Entrance Stairs-Number of Steps: 3 Home Layout: One level Home Equipment: None      Prior Function Level of Independence: Independent               Hand Dominance        Extremity/Trunk Assessment               Lower Extremity Assessment: LLE deficits/detail   LLE Deficits / Details: good quad contraction, approx 40* knee flexion AAROM     Communication   Communication: No difficulties  Cognition Arousal/Alertness: Awake/alert Behavior During Therapy: WFL for tasks assessed/performed Overall Cognitive Status: Within Functional Limits for tasks assessed                      General Comments      Exercises Total Joint Exercises Ankle Circles/Pumps: AROM;Both;15 reps Quad Sets: AROM;Both;15 reps Towel Squeeze: AROM;Both;15 reps Short Arc QuadSinclair Ship;Left;15 reps Heel Slides: AAROM;Left;15 reps Hip ABduction/ADduction: AROM;Left;15 reps Straight Leg Raises: AAROM;Left;10 reps      Assessment/Plan  PT Assessment Patient needs continued PT services  PT Diagnosis Acute pain;Difficulty walking   PT Problem List Decreased strength;Decreased range of motion;Decreased knowledge of use of DME;Pain;Decreased mobility  PT Treatment Interventions Gait training;Stair training;DME instruction;Functional mobility training;Therapeutic activities;Therapeutic exercise;Patient/family education   PT Goals (Current goals can be found in the Care Plan section) Acute Rehab PT Goals PT Goal Formulation: With patient Time For Goal  Achievement: 05/08/14 Potential to Achieve Goals: Good    Frequency 7X/week   Barriers to discharge        Co-evaluation               End of Session   Activity Tolerance: Patient tolerated treatment well Patient left: in chair;with family/visitor present;with call bell/phone within reach           Time: 2376-2831 PT Time Calculation (min): 27 min   Charges:   PT Evaluation $Initial PT Evaluation Tier I: 1 Procedure PT Treatments $Gait Training: 8-22 mins $Therapeutic Exercise: 8-22 mins   PT G Codes:          Nattie Lazenby,KATHrine E 05/05/2014, 11:13 AM Carmelia Bake, PT, DPT 05/05/2014 Pager: [phone]

## 2014-05-05 NOTE — Care Management Note (Addendum)
    Page 1 of 2   05/05/2014     10:35:45 AM CARE MANAGEMENT NOTE 05/05/2014  Patient:  Gabriela Shepherd, Gabriela Shepherd   Account Number:  [id number]  Date Initiated:  05/05/2014  Documentation initiated by:  [facility]  Subjective/Objective Assessment:   adm: LEFT TOTAL KNEE ARTHROPLASTY (Left)     Action/Plan:   discharge planning   Anticipated DC Date:  05/05/2014   Anticipated DC Plan:  [place]  CM consult      [facility] Choice  HOME HEALTH   Choice offered to / List presented to:  C-1 Patient   DME arranged  3-N-1  Vassie Moselle      DME agency  [place] arranged  [place]   Status of service:  Completed, signed off Medicare Important Message given?   (If response is "NO", the following Medicare IM given date fields will be blank) Date Medicare IM given:   Medicare IM given by:   Date Additional Medicare IM given:   Additional Medicare IM given by:    Discharge Disposition:  [place]  Per UR Regulation:  Reviewed for med. necessity/level of care/duration of stay  If discussed at [place] of Stay Meetings, dates discussed:    Comments:  05/05/14 10:00 Cm met with pt in room to confirm choice of [facility] for HHPT.  Pt confirms.  Shaune Leeks on unit and aware of referral.  Cm called [facility] DME rep, Lecretia to deliver 3n1 and rooling walker to room.  Mary from [place] has confirmed addrress and contact information with pt.  No other CM needs were communicated.  Mariane Masters, BSN, [place].

## 2014-05-05 NOTE — Evaluation (Signed)
Occupational Therapy Evaluation Patient Details Name: Gabriela Shepherd MRN: [id number] DOB: January 09, 1956 Today's Date: 05/05/2014    History of Present Illness Pt is a 58 year old female s/p L TKA   Clinical Impression   Pt up to 3in1 with walker with min assist. Daughter present for session. Pt is hoping for d/c later today. She feels comfortable with 3in1 transfer if she does d/c but will continue to follow if here after today. She plans to sponge bathe and daughter can assist with LB self care.     Follow Up Recommendations  No OT follow up;Supervision/Assistance - 24 hour    Equipment Recommendations  3 in 1 bedside comode    Recommendations for Other Services       Precautions / Restrictions Precautions Precautions: Knee Restrictions Weight Bearing Restrictions: No Other Position/Activity Restrictions: WBAT      Mobility Bed Mobility             Transfers Overall transfer level: Needs assistance Equipment used: Rolling walker (2 wheeled) Transfers: Sit to/from Stand Sit to Stand: Min assist         General transfer comment: verbal cues for hand placement and LE management. Pt a little unsteady with transition of reaching from 3in1 to walker.    Balance                                            ADL Overall ADL's : Needs assistance/impaired Eating/Feeding: Independent;Sitting   Grooming: Wash/dry hands;Minimal assistance;Standing   Upper Body Bathing: Set up;Sitting   Lower Body Bathing: Moderate assistance;Sit to/from stand   Upper Body Dressing : Set up;Sitting   Lower Body Dressing: Moderate assistance;Sit to/from stand   Toilet Transfer: Minimal assistance;Ambulation;BSC;RW   Toileting- Clothing Manipulation and Hygiene: Minimal assistance;Sit to/from stand         General ADL Comments: Pt has a tub and educated her on options for a tubseat including tub transfer bench. Pt states she will sponge bathe initially. Will  need 3in1 and discussed how to adjust for appropriate height. Daughter present for session. Pt needs min cues for hand placement and LE management during 3in1 transfer. Reviewed sequence for LB dressing and safe walker use.      Vision                     Perception     Praxis      Pertinent Vitals/Pain Pain Assessment: 0-10 Pain Score: 5  Pain Location: L knee Pain Descriptors / Indicators: Aching Pain Intervention(s): Repositioned;Ice applied     Hand Dominance     Extremity/Trunk Assessment Upper Extremity Assessment Upper Extremity Assessment: Overall WFL for tasks assessed   Lower Extremity Assessment Lower Extremity Assessment: LLE deficits/detail LLE Deficits / Details: good quad contraction, approx 40* knee flexion AAROM       Communication Communication Communication: No difficulties   Cognition Arousal/Alertness: Awake/alert Behavior During Therapy: WFL for tasks assessed/performed Overall Cognitive Status: Within Functional Limits for tasks assessed                     General Comments       Exercises       Shoulder Instructions      Home Living Family/patient expects to be discharged to:: Private residence Living Arrangements: Spouse/significant other Available Help at Discharge: Family Type of Home:  House Home Access: Stairs to enter Technical brewer of Steps: 3 Entrance Stairs-Rails: None Home Layout: One level     Bathroom Shower/Tub: Teacher, early years/pre: Standard     Home Equipment: None          Prior Functioning/Environment Level of Independence: Independent             OT Diagnosis: Generalized weakness   OT Problem List: Decreased strength;Decreased knowledge of use of DME or AE   OT Treatment/Interventions: Self-care/ADL training;Patient/family education;Therapeutic activities;DME and/or AE instruction    OT Goals(Current goals can be found in the care plan section) Acute Rehab OT  Goals Patient Stated Goal: home OT Goal Formulation: With patient Time For Goal Achievement: 05/12/14 Potential to Achieve Goals: Good  OT Frequency: Min 2X/week   Barriers to D/C:            Co-evaluation              End of Session Equipment Utilized During Treatment: Gait belt;Rolling walker  Activity Tolerance: Patient tolerated treatment well Patient left: in chair;with call bell/phone within reach;with family/visitor present   Time: 1203-1225 OT Time Calculation (min): 22 min Charges:  OT General Charges $OT Visit: 1 Procedure OT Evaluation $Initial OT Evaluation Tier I: 1 Procedure OT Treatments $Therapeutic Activity: 8-22 mins G-Codes:    Jules Schick [phone] 05/05/2014, 12:41 PM

## 2014-05-08 NOTE — Discharge Summary (Signed)
Physician Discharge Summary  Patient ID: Gabriela Shepherd MRN: [id number] DOB/AGE: 1956-07-01 58 y.o.  Admit date: 05/04/2014 Discharge date: 05/05/2014   Procedures:  Procedure(s) (LRB): LEFT TOTAL KNEE ARTHROPLASTY (Left)  Attending Physician:  Dr. Paralee Cancel   Admission Diagnoses:   Left knee OA / pain  Discharge Diagnoses:  Principal Problem:   S/P left TKA Active Problems:   S/P knee replacement  Past Medical History  Diagnosis Date  . Anxiety   . Asthma     sees Dr. Annamaria Boots  . GERD (gastroesophageal reflux disease)   . Hypertension   . Allergic rhinitis     sees Dr. Annamaria Boots  . Osteoarthritis   . Carpal tunnel syndrome     sees Dr. Amedeo Plenty  . Gynecological examination     sees Dr. Elyse Hsu  . Headache(784.0)     hx of pt states BP meds has improved     HPI: Gabriela Shepherd, 58 y.o. female, has a history of pain and functional disability in the left knee due to arthritis and has failed non-surgical conservative treatments for greater than 12 weeks to includeNSAID's and/or analgesics, corticosteriod injections, use of assistive devices and activity modification. Onset of symptoms was gradual, starting 2+ years ago with gradually worsening course since that time. The patient noted no past surgery on the left knee(s). Patient currently rates pain in the left knee(s) at 8 out of 10 with activity. Patient has night pain, worsening of pain with activity and weight bearing, pain that interferes with activities of daily living, pain with passive range of motion, crepitus and joint swelling. Patient has evidence of periarticular osteophytes and joint space narrowing by imaging studies. There is no active infection. Risks, benefits and expectations were discussed with the patient. Risks including but not limited to the risk of anesthesia, blood clots, nerve damage, blood vessel damage, failure of the prosthesis, infection and up to and including death. Patient understand  the risks, benefits and expectations and wishes to proceed with surgery.  PCP: Laurey Morale, MD   Discharged Condition: good  Hospital Course:  Patient underwent the above stated procedure on 05/04/2014. Patient tolerated the procedure well and brought to the recovery room in good condition and subsequently to the floor.  POD #1 BP: 116/52 ; Pulse: 52 ; Temp: 98 F (36.7 C) ; Resp: 16 Patient reports pain as moderate. A bit sleepy this am maybe due to meds. But ready for therapy, only dangled last night. Ready to be discharged home. Dorsiflexion/plantar flexion intact, incision: dressing C/D/I, no cellulitis present and compartment soft.   LABS  Basename    HGB  11.7  HCT  34.8    Discharge Exam: General appearance: alert, cooperative and no distress Extremities: Homans sign is negative, no sign of DVT, no edema, redness or tenderness in the calves or thighs and no ulcers, gangrene or trophic changes  Disposition: Home with follow up in 2 weeks   Follow-up Information   Follow up with Mauri Pole, MD. Schedule an appointment as soon as possible for a visit in 2 weeks.   Specialty:  Orthopedic Surgery   Contact information:   [address] [place] [address] [phone]       Follow up with [facility]. (3n1 (commode), rolling walker)    Contact information:   [place] [address] [phone]       Follow up with [facility] (home health physical therapy)  Contact information:   [address] [place] [address] [phone]       Discharge Instructions   Call MD / Call 911    Complete by:  As directed   If you experience chest pain or shortness of breath, CALL 911 and be transported to the hospital emergency room.  If you develope a fever above 101 F, pus (white drainage) or increased drainage or redness at the wound, or calf pain, call your surgeon's office.     Change  dressing    Complete by:  As directed   Maintain surgical dressing for 10-14 days, or until follow up in the clinic.     Constipation Prevention    Complete by:  As directed   Drink plenty of fluids.  Prune juice may be helpful.  You may use a stool softener, such as Colace (over the counter) 100 mg twice a day.  Use MiraLax (over the counter) for constipation as needed.     Diet - low sodium heart healthy    Complete by:  As directed      Discharge instructions    Complete by:  As directed   Maintain surgical dressing for 10-14 days, or until follow up in the clinic. Follow up in 2 weeks at [facility]. Call with any questions or concerns.     Increase activity slowly as tolerated    Complete by:  As directed      TED hose    Complete by:  As directed   Use stockings (TED hose) for 2 weeks on both leg(s).  You may remove them at night for sleeping.     Weight bearing as tolerated    Complete by:  As directed   Laterality:  left  Extremity:  Lower             Medication List    STOP taking these medications       ALEVE 220 MG Caps  Generic drug:  Naproxen Sodium      TAKE these medications       albuterol 108 (90 BASE) MCG/ACT inhaler  Commonly known as:  PROVENTIL HFA;VENTOLIN HFA  Inhale into the lungs every 6 (six) hours as needed for wheezing or shortness of breath.     ASPERCREME EX  Apply 1 application topically once as needed (knee pain.).     aspirin 325 MG EC tablet  Take 1 tablet (325 mg total) by mouth 2 (two) times daily.     DSS 100 MG Caps  Take 100 mg by mouth 2 (two) times daily.     ferrous sulfate 325 (65 FE) MG tablet  Take 1 tablet (325 mg total) by mouth 3 (three) times daily after meals.     HYDROcodone-acetaminophen 7.5-325 MG per tablet  Commonly known as:  NORCO  Take 1-2 tablets by mouth every 4 (four) hours as needed for moderate pain.     methocarbamol 500 MG tablet  Commonly known as:  ROBAXIN  Take 1 tablet (500 mg  total) by mouth every 6 (six) hours as needed for muscle spasms.     multivitamin tablet  Take 1 tablet by mouth every morning.     OVER THE COUNTER MEDICATION  Place 1 application onto the skin once as needed (knee pain.).     pantoprazole 40 MG tablet  Commonly known as:  PROTONIX  Take 40 mg by mouth every morning.     polyethylene glycol packet  Commonly known as:  MIRALAX / GLYCOLAX  Take 17 g by mouth 2 (two) times daily.     UNABLE TO FIND  Inject 1 each into the skin once a week. Allergy injection     valsartan-hydrochlorothiazide 160-25 MG per tablet  Commonly known as:  DIOVAN-HCT  Take 1 tablet by mouth every morning.     verapamil 120 MG tablet  Commonly known as:  CALAN  Take 120 mg by mouth every morning.         Signed: [place] Pugh. Mical Kicklighter   PA-C  05/08/2014, 7:39 AM

## 2014-05-19 ENCOUNTER — Ambulatory Visit (HOSPITAL_COMMUNITY)
Admission: RE | Admit: 2014-05-19 | Discharge: 2014-05-19 | Disposition: A | Discharge status: Home / Self Care | Payer: Managed Care, Other (non HMO) | Source: Ambulatory Visit | Attending: Cardiology | Admitting: Cardiology

## 2014-05-19 ENCOUNTER — Other Ambulatory Visit (HOSPITAL_COMMUNITY): Payer: Self-pay | Admitting: Orthopedic Surgery

## 2014-05-19 DIAGNOSIS — Z471 Aftercare following joint replacement surgery: Secondary | ICD-10-CM | POA: Diagnosis not present

## 2014-05-19 DIAGNOSIS — M7989 Other specified soft tissue disorders: Secondary | ICD-10-CM | POA: Diagnosis not present

## 2014-05-19 DIAGNOSIS — Z96698 Presence of other orthopedic joint implants: Principal | ICD-10-CM

## 2014-05-19 NOTE — Progress Notes (Signed)
Left Lower Extremity Venous Duplex Completed. No evidence for DVT or SVT. °Brianna L Mazza,RVT °

## 2014-05-22 ENCOUNTER — Ambulatory Visit: Payer: Managed Care, Other (non HMO)

## 2014-05-22 ENCOUNTER — Ambulatory Visit (INDEPENDENT_AMBULATORY_CARE_PROVIDER_SITE_OTHER): Payer: Managed Care, Other (non HMO)

## 2014-05-22 DIAGNOSIS — J309 Allergic rhinitis, unspecified: Secondary | ICD-10-CM

## 2014-05-25 ENCOUNTER — Encounter (HOSPITAL_COMMUNITY): Payer: Self-pay | Admitting: Orthopedic Surgery

## 2014-05-28 ENCOUNTER — Ambulatory Visit (INDEPENDENT_AMBULATORY_CARE_PROVIDER_SITE_OTHER): Payer: Managed Care, Other (non HMO)

## 2014-05-28 DIAGNOSIS — J309 Allergic rhinitis, unspecified: Secondary | ICD-10-CM

## 2014-05-29 ENCOUNTER — Ambulatory Visit: Payer: Managed Care, Other (non HMO)

## 2014-06-04 ENCOUNTER — Encounter: Payer: Self-pay | Admitting: Internal Medicine

## 2014-06-05 ENCOUNTER — Ambulatory Visit (INDEPENDENT_AMBULATORY_CARE_PROVIDER_SITE_OTHER): Payer: Managed Care, Other (non HMO)

## 2014-06-05 DIAGNOSIS — J309 Allergic rhinitis, unspecified: Secondary | ICD-10-CM

## 2014-06-12 ENCOUNTER — Ambulatory Visit (INDEPENDENT_AMBULATORY_CARE_PROVIDER_SITE_OTHER): Payer: Managed Care, Other (non HMO)

## 2014-06-12 DIAGNOSIS — J309 Allergic rhinitis, unspecified: Secondary | ICD-10-CM

## 2014-06-15 ENCOUNTER — Encounter: Payer: Self-pay | Admitting: Internal Medicine

## 2014-06-19 ENCOUNTER — Ambulatory Visit (INDEPENDENT_AMBULATORY_CARE_PROVIDER_SITE_OTHER): Payer: Managed Care, Other (non HMO)

## 2014-06-19 DIAGNOSIS — J309 Allergic rhinitis, unspecified: Secondary | ICD-10-CM

## 2014-06-26 ENCOUNTER — Ambulatory Visit (INDEPENDENT_AMBULATORY_CARE_PROVIDER_SITE_OTHER): Payer: Managed Care, Other (non HMO)

## 2014-06-26 DIAGNOSIS — J309 Allergic rhinitis, unspecified: Secondary | ICD-10-CM

## 2014-06-29 ENCOUNTER — Other Ambulatory Visit: Payer: Self-pay | Admitting: Family Medicine

## 2014-07-03 ENCOUNTER — Ambulatory Visit (INDEPENDENT_AMBULATORY_CARE_PROVIDER_SITE_OTHER): Payer: Managed Care, Other (non HMO)

## 2014-07-03 DIAGNOSIS — J309 Allergic rhinitis, unspecified: Secondary | ICD-10-CM

## 2014-07-10 ENCOUNTER — Telehealth: Payer: Self-pay | Admitting: Internal Medicine

## 2014-07-10 ENCOUNTER — Ambulatory Visit (INDEPENDENT_AMBULATORY_CARE_PROVIDER_SITE_OTHER): Payer: Managed Care, Other (non HMO)

## 2014-07-10 DIAGNOSIS — J309 Allergic rhinitis, unspecified: Secondary | ICD-10-CM

## 2014-07-10 NOTE — Telephone Encounter (Signed)
Pt states she is being laid off at the end of the month and will be without insurance for a few weeks until Cobra kicks in, and she will not be able to afford to come in and receive her allergy injections during that time.  She is wondering if she can take her serum home with her and self administer until her insurance is restarted, or if she needs to just go off allergy injections until she has health insurance again.  Pt states that she was administering injections at home a few years back and has someone who is able to give them to her.  Dr young please advise.  Thank you.

## 2014-07-10 NOTE — Telephone Encounter (Signed)
I understand and hope things straighten out for her quickly. We will have to stop vaccine. We are no longer able to go to administration of allergy vaccine outside a medical office. Sometimes a primary physician might be willing to have our allergy shots given in his office. She would need to check.

## 2014-07-10 NOTE — Telephone Encounter (Signed)
Spoke with the pt and notified of recs per CDY  She verbalized understanding  Nothing further needed 

## 2014-07-16 ENCOUNTER — Ambulatory Visit (INDEPENDENT_AMBULATORY_CARE_PROVIDER_SITE_OTHER): Payer: Managed Care, Other (non HMO)

## 2014-07-16 DIAGNOSIS — J309 Allergic rhinitis, unspecified: Secondary | ICD-10-CM

## 2014-07-22 ENCOUNTER — Ambulatory Visit: Payer: Managed Care, Other (non HMO)

## 2014-08-28 ENCOUNTER — Ambulatory Visit (INDEPENDENT_AMBULATORY_CARE_PROVIDER_SITE_OTHER): Payer: BLUE CROSS/BLUE SHIELD

## 2014-08-28 DIAGNOSIS — J309 Allergic rhinitis, unspecified: Secondary | ICD-10-CM

## 2014-09-02 ENCOUNTER — Telehealth: Payer: Self-pay

## 2014-09-02 NOTE — Telephone Encounter (Signed)
[facility] refill request for VALSARTAN/HCTZ 160-25MG  TABLETS

## 2014-09-03 MED ORDER — VALSARTAN-HYDROCHLOROTHIAZIDE 160-25 MG PO TABS: 1.0000 | ORAL_TABLET | Freq: Every day | ORAL | Status: DC

## 2014-09-03 NOTE — Telephone Encounter (Signed)
Rx sent to pharmacy and noted pt needs office visit.

## 2014-09-04 ENCOUNTER — Ambulatory Visit (INDEPENDENT_AMBULATORY_CARE_PROVIDER_SITE_OTHER): Payer: BLUE CROSS/BLUE SHIELD

## 2014-09-04 DIAGNOSIS — J309 Allergic rhinitis, unspecified: Secondary | ICD-10-CM

## 2014-09-07 ENCOUNTER — Telehealth: Payer: Self-pay

## 2014-09-07 MED ORDER — VERAPAMIL HCL 120 MG PO TABS: 120.0000 mg | ORAL_TABLET | Freq: Every day | ORAL | Status: DC

## 2014-09-07 NOTE — Telephone Encounter (Signed)
Refill sent to pharmacy and noted pt needs office visit

## 2014-09-09 ENCOUNTER — Telehealth: Payer: Self-pay | Admitting: Family Medicine

## 2014-09-09 NOTE — Telephone Encounter (Signed)
Pt has appt with dr fry on 09-23-2014. Pt needs verapamil, valsartan-hctz #90 each w/refills [facility]

## 2014-09-09 NOTE — Telephone Encounter (Signed)
Pt would like all future refills to go to [facility].

## 2014-09-10 NOTE — Telephone Encounter (Signed)
Both prescriptions were sent to [facility].

## 2014-09-11 ENCOUNTER — Ambulatory Visit (INDEPENDENT_AMBULATORY_CARE_PROVIDER_SITE_OTHER): Payer: BLUE CROSS/BLUE SHIELD

## 2014-09-11 DIAGNOSIS — J309 Allergic rhinitis, unspecified: Secondary | ICD-10-CM

## 2014-09-15 ENCOUNTER — Ambulatory Visit (INDEPENDENT_AMBULATORY_CARE_PROVIDER_SITE_OTHER): Payer: BLUE CROSS/BLUE SHIELD

## 2014-09-15 DIAGNOSIS — J309 Allergic rhinitis, unspecified: Secondary | ICD-10-CM

## 2014-09-18 ENCOUNTER — Ambulatory Visit (INDEPENDENT_AMBULATORY_CARE_PROVIDER_SITE_OTHER): Payer: BLUE CROSS/BLUE SHIELD

## 2014-09-18 DIAGNOSIS — J309 Allergic rhinitis, unspecified: Secondary | ICD-10-CM

## 2014-09-23 ENCOUNTER — Encounter: Payer: Self-pay | Admitting: Family Medicine

## 2014-09-23 ENCOUNTER — Ambulatory Visit (INDEPENDENT_AMBULATORY_CARE_PROVIDER_SITE_OTHER): Payer: BLUE CROSS/BLUE SHIELD | Admitting: Family Medicine

## 2014-09-23 VITALS — BP 121/64 | HR 75 | Temp 98.5°F | Ht 69.0 in | Wt 250.0 lb

## 2014-09-23 DIAGNOSIS — K219 Gastro-esophageal reflux disease without esophagitis: Secondary | ICD-10-CM

## 2014-09-23 DIAGNOSIS — I10 Essential (primary) hypertension: Secondary | ICD-10-CM

## 2014-09-23 DIAGNOSIS — F411 Generalized anxiety disorder: Secondary | ICD-10-CM

## 2014-09-23 MED ORDER — VALSARTAN-HYDROCHLOROTHIAZIDE 160-25 MG PO TABS: 1.0000 | ORAL_TABLET | ORAL | Status: DC

## 2014-09-23 MED ORDER — VALSARTAN-HYDROCHLOROTHIAZIDE 160-25 MG PO TABS: 1.0000 | ORAL_TABLET | Freq: Every day | ORAL | Status: DC

## 2014-09-23 NOTE — Progress Notes (Signed)
Pre visit review using our clinic review tool, if applicable. No additional management support is needed unless otherwise documented below in the visit note. 

## 2014-09-23 NOTE — Progress Notes (Signed)
   Subjective:    Patient ID: Gabriela Shepherd, female    DOB: 1956/04/05, 59 y.o.   MRN: [id number]  HPI Here to follow up. She feels well an her BP has been stable. She is currently out of work and has been sending out lots of applications. Her asthma is stable, and her allergist took her off of Advair.    Review of Systems  Constitutional: Negative.   Respiratory: Negative.   Cardiovascular: Negative.        Objective:   Physical Exam  Constitutional: She appears well-developed and well-nourished.  HENT:  Right Ear: External ear normal.  Left Ear: External ear normal.  Nose: Nose normal.  Mouth/Throat: Oropharynx is clear and moist.  Eyes: Conjunctivae are normal.  Cardiovascular: Normal rate, regular rhythm, normal heart sounds and intact distal pulses.   Pulmonary/Chest: Effort normal and breath sounds normal.  Lymphadenopathy:    She has no cervical adenopathy.          Assessment & Plan:  She is doing well. meds were refilled.

## 2014-09-25 ENCOUNTER — Ambulatory Visit (INDEPENDENT_AMBULATORY_CARE_PROVIDER_SITE_OTHER): Payer: BLUE CROSS/BLUE SHIELD

## 2014-09-25 ENCOUNTER — Telehealth: Payer: Self-pay | Admitting: Family Medicine

## 2014-09-25 DIAGNOSIS — J309 Allergic rhinitis, unspecified: Secondary | ICD-10-CM

## 2014-09-25 NOTE — Telephone Encounter (Signed)
emmi emailed °

## 2014-10-01 ENCOUNTER — Ambulatory Visit (INDEPENDENT_AMBULATORY_CARE_PROVIDER_SITE_OTHER): Payer: BLUE CROSS/BLUE SHIELD

## 2014-10-01 DIAGNOSIS — J309 Allergic rhinitis, unspecified: Secondary | ICD-10-CM

## 2014-10-02 ENCOUNTER — Encounter: Payer: Self-pay | Admitting: Internal Medicine

## 2014-10-09 ENCOUNTER — Ambulatory Visit (INDEPENDENT_AMBULATORY_CARE_PROVIDER_SITE_OTHER): Payer: BLUE CROSS/BLUE SHIELD

## 2014-10-09 DIAGNOSIS — J309 Allergic rhinitis, unspecified: Secondary | ICD-10-CM

## 2014-10-14 ENCOUNTER — Other Ambulatory Visit: Payer: Self-pay | Admitting: Family Medicine

## 2014-10-14 MED ORDER — VALSARTAN-HYDROCHLOROTHIAZIDE 160-25 MG PO TABS: 1.0000 | ORAL_TABLET | Freq: Every day | ORAL | Status: DC

## 2014-10-14 MED ORDER — VERAPAMIL HCL 120 MG PO TABS: 120.0000 mg | ORAL_TABLET | Freq: Every day | ORAL | Status: DC

## 2014-10-14 MED ORDER — PANTOPRAZOLE SODIUM 40 MG PO TBEC: 40.0000 mg | DELAYED_RELEASE_TABLET | ORAL | Status: DC

## 2014-10-14 NOTE — Telephone Encounter (Signed)
Pt saw dr fry 3/2 for her refills, but per pt, they were not sent  verapamil (CALAN) 120 MG tablet valsartan-hydrochlorothiazide (DIOVAN-HCT) 160-25 MG per tablet pantoprazole (PROTONIX) 40 MG tablet  [facility] fax  [phone] phone: [phone]

## 2014-10-14 NOTE — Telephone Encounter (Signed)
Rx sent to pharmacy   

## 2014-10-15 ENCOUNTER — Ambulatory Visit (INDEPENDENT_AMBULATORY_CARE_PROVIDER_SITE_OTHER): Payer: BLUE CROSS/BLUE SHIELD

## 2014-10-15 DIAGNOSIS — J309 Allergic rhinitis, unspecified: Secondary | ICD-10-CM

## 2014-10-23 ENCOUNTER — Ambulatory Visit (INDEPENDENT_AMBULATORY_CARE_PROVIDER_SITE_OTHER): Payer: BLUE CROSS/BLUE SHIELD

## 2014-10-23 DIAGNOSIS — J309 Allergic rhinitis, unspecified: Secondary | ICD-10-CM

## 2014-10-27 ENCOUNTER — Ambulatory Visit: Payer: BC Managed Care – PPO | Admitting: Family Medicine

## 2014-10-30 ENCOUNTER — Ambulatory Visit (INDEPENDENT_AMBULATORY_CARE_PROVIDER_SITE_OTHER): Payer: BLUE CROSS/BLUE SHIELD

## 2014-10-30 DIAGNOSIS — J309 Allergic rhinitis, unspecified: Secondary | ICD-10-CM

## 2014-11-06 ENCOUNTER — Ambulatory Visit (INDEPENDENT_AMBULATORY_CARE_PROVIDER_SITE_OTHER): Payer: BLUE CROSS/BLUE SHIELD

## 2014-11-06 DIAGNOSIS — J309 Allergic rhinitis, unspecified: Secondary | ICD-10-CM

## 2014-11-13 ENCOUNTER — Ambulatory Visit (INDEPENDENT_AMBULATORY_CARE_PROVIDER_SITE_OTHER): Payer: BLUE CROSS/BLUE SHIELD

## 2014-11-13 DIAGNOSIS — J309 Allergic rhinitis, unspecified: Secondary | ICD-10-CM | POA: Diagnosis not present

## 2014-11-20 ENCOUNTER — Ambulatory Visit (INDEPENDENT_AMBULATORY_CARE_PROVIDER_SITE_OTHER): Payer: BLUE CROSS/BLUE SHIELD

## 2014-11-20 DIAGNOSIS — J309 Allergic rhinitis, unspecified: Secondary | ICD-10-CM | POA: Diagnosis not present

## 2014-11-27 ENCOUNTER — Ambulatory Visit (INDEPENDENT_AMBULATORY_CARE_PROVIDER_SITE_OTHER): Payer: BLUE CROSS/BLUE SHIELD

## 2014-11-27 DIAGNOSIS — J309 Allergic rhinitis, unspecified: Secondary | ICD-10-CM | POA: Diagnosis not present

## 2014-12-04 ENCOUNTER — Ambulatory Visit (INDEPENDENT_AMBULATORY_CARE_PROVIDER_SITE_OTHER): Payer: BLUE CROSS/BLUE SHIELD

## 2014-12-04 DIAGNOSIS — J309 Allergic rhinitis, unspecified: Secondary | ICD-10-CM

## 2014-12-08 ENCOUNTER — Ambulatory Visit (INDEPENDENT_AMBULATORY_CARE_PROVIDER_SITE_OTHER): Payer: BLUE CROSS/BLUE SHIELD | Admitting: Certified Nurse Midwife

## 2014-12-08 ENCOUNTER — Encounter: Payer: Self-pay | Admitting: Certified Nurse Midwife

## 2014-12-08 VITALS — BP 124/68 | HR 76 | Resp 20 | Ht 67.0 in | Wt 254.0 lb

## 2014-12-08 DIAGNOSIS — Z01419 Encounter for gynecological examination (general) (routine) without abnormal findings: Secondary | ICD-10-CM

## 2014-12-08 DIAGNOSIS — N950 Postmenopausal bleeding: Secondary | ICD-10-CM | POA: Diagnosis not present

## 2014-12-08 DIAGNOSIS — N841 Polyp of cervix uteri: Secondary | ICD-10-CM

## 2014-12-08 DIAGNOSIS — R319 Hematuria, unspecified: Secondary | ICD-10-CM | POA: Diagnosis not present

## 2014-12-08 DIAGNOSIS — Z124 Encounter for screening for malignant neoplasm of cervix: Secondary | ICD-10-CM | POA: Diagnosis not present

## 2014-12-08 DIAGNOSIS — Z0000 Encounter for general adult medical examination without abnormal findings: Secondary | ICD-10-CM

## 2014-12-08 LAB — POCT URINALYSIS DIPSTICK
Bilirubin, UA: NEGATIVE
Glucose, UA: NEGATIVE
KETONES UA: NEGATIVE
NITRITE UA: NEGATIVE
Protein, UA: NEGATIVE
Urobilinogen, UA: NEGATIVE
pH, UA: 5

## 2014-12-08 NOTE — Progress Notes (Signed)
59 y.o. [id number] Single  Caucasian Fe here for annual exam. Menopausal no HRT. Denies vaginal bleeding or vaginal dryness. Sees PCP for  Hypertension medication management and labs were done with left knee surgery done in 10/15. Had some slight spotting occasional in past year. Patient has cervical polyp and assumed that was it. No pain with bleeding. Denies urinary symptoms or frequency/urgency or pain. No other health issues today.  Patient's last menstrual period was 11/21/2009.          Sexually active: No.  The current method of family planning is tubal ligation.    Exercising: No.  The patient does not participate in regular exercise at present. Smoker:  no  Health Maintenance: Pap:  12/04/13 Neg MMG:  04/10/14 BIRADS1:Neg Self Breast Exam: yes, Occ Colonoscopy:  11/2008 polyps and mild diverticulosis repeat in 10 years  BMD:   Never TDaP:  2009 Labs: PCP UA: RBC=Trace, WBC=Small    reports that she has never smoked. She has never used smokeless tobacco. She reports that she drinks alcohol. She reports that she does not use illicit drugs.  Past Medical History  Diagnosis Date  . Anxiety   . Asthma     sees Dr. Annamaria Boots  . GERD (gastroesophageal reflux disease)   . Hypertension   . Allergic rhinitis     sees Dr. Annamaria Boots  . Osteoarthritis   . Carpal tunnel syndrome     sees Dr. Amedeo Plenty  . Gynecological examination     sees Dr. Elyse Hsu  . Headache(784.0)     hx of pt states BP meds has improved     Past Surgical History  Procedure Laterality Date  . Bunionectomy    . Colonoscopy  11-27-08    per Dr. Olevia Perches, hyperplastic polyps, repeat in 7 yrs   . Hysteroscopic polyp resection    . Knee arthroscopy    . Tubal ligation    . Refractive surgery      related to hole in back of left eye as stated per pt   . 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 ibuprofen (ADVIL,MOTRIN) 200 MG tablet Take 200 mg by mouth every 6 (six) hours as needed.    . Multiple Vitamin (MULTIVITAMIN) tablet Take 1 tablet by mouth every morning.     Marland Kitchen OVER THE COUNTER MEDICATION Place 1 application onto the skin once as needed (knee pain.).    Marland Kitchen pantoprazole (PROTONIX) 40 MG tablet Take 1 tablet (40 mg total) by mouth every morning. 90 tablet 3  . Trolamine Salicylate (ASPERCREME EX) Apply 1 application topically once as needed (knee pain.).    Marland Kitchen UNABLE TO FIND Inject 1 each into the skin once a week. Allergy injection    . valsartan-hydrochlorothiazide (DIOVAN-HCT) 160-25 MG per tablet Take 1 tablet by mouth daily. 90 tablet 3  . verapamil (CALAN) 120 MG tablet Take 1 tablet (120 mg total) by mouth daily. 90 tablet 3   No current facility-administered medications for this visit.    Family History  Problem Relation Age of Onset  . Liver cancer Mother     deceased   . Hypertension Mother   . Heart disease Father   . Hypertension Father   . Diabetes Maternal Grandmother   . Cancer Maternal Aunt  ROS:  Pertinent items are noted in HPI.  Otherwise, a comprehensive ROS was negative.  Exam:   BP 124/68 mmHg  Pulse 76  Resp 20  Ht 5\' 7"  (1.702 m)  Wt 254 lb (115.214 kg)  BMI 39.77 kg/m2  LMP 11/21/2009 Height: 5\' 7"  (170.2 cm) Ht Readings from Last 3 Encounters:  12/08/14 5\' 7"  (1.702 m)  09/23/14 5\' 9"  (1.753 m)  05/04/14 5\' 9"  (1.753 m)    General appearance: alert, cooperative and appears stated age Head: Normocephalic, without obvious abnormality, atraumatic Neck: no adenopathy, supple, symmetrical, trachea midline and thyroid normal to inspection and palpation Lungs: clear to auscultation bilaterally Breasts: normal appearance, no masses or tenderness, No nipple retraction or dimpling, No nipple discharge or bleeding, No axillary or supraclavicular  adenopathy Heart: regular rate and rhythm Abdomen: soft, non-tender; no masses,  no organomegaly Extremities: extremities normal, atraumatic, no cyanosis or edema Skin: Skin color, texture, turgor normal. No rashes or lesions Lymph nodes: Cervical, supraclavicular, and axillary nodes normal. No abnormal inguinal nodes palpated Neurologic: Grossly normal   Pelvic: External genitalia:  no lesions              Urethra:  normal appearing urethra with no masses, tenderness or lesions              Bartholin's and Skene's: normal                 Vagina: normal appearing vagina with normal color and discharge, no lesions              Cervix: normal, non tender, large cervical polyp noted, has enlarged from previos year, with bleeding when touched 3-4 cm size              Pap taken: Yes.   Bimanual Exam:  Uterus:  normal size, contour, position, consistency, mobility, non-tender              Adnexa: normal adnexa and no mass, fullness, tenderness               Rectovaginal: Confirms               Anus:  normal sphincter tone, no lesions  Chaperone present: Yes  A:  Well Woman with normal exam  Post Menopausal with bleeding  History of cervical polyp with increase in size with bleeding noted on exam   Hypertension with  PCP management  R/O UTI  P:   Reviewed health and wellness pertinent to exam  Discussed need to evaluate vaginal bleeding with endometrial biopsy and possible PUS and concerns with this. It could from polyp, but should not assume. Concern is to make sure no other problems. Questions addressed. Patient agreeable.  Discussed Cervical Polyp still present and definite increase in size approximately 3-4cm and would recommend removal. Patient agreeable. Discussed again etiology per request and usually benign, but due to size and bleeding would recommend removal. Discussed with patient will be called with information from insurance and will be scheduled. Voiced understanding.  Continue  follow up as indicated  Lab: Urine culture/micro  Pap smear  Taken today with HPV reflex   counseled on breast self exam, mammography screening, adequate intake of calcium and vitamin D, diet and exercise, Kegel's exercises  return annually or prn  An After Visit Summary was printed and given to the patient.

## 2014-12-08 NOTE — Patient Instructions (Signed)

## 2014-12-09 LAB — URINALYSIS, MICROSCOPIC ONLY
Bacteria, UA: NONE SEEN
Casts: NONE SEEN
Crystals: NONE SEEN
Squamous Epithelial / LPF: NONE SEEN

## 2014-12-10 LAB — URINE CULTURE
Colony Count: NO GROWTH
ORGANISM ID, BACTERIA: NO GROWTH

## 2014-12-10 NOTE — Progress Notes (Signed)
Pt needs to be scheduled for endometrial biopsy and cervical polyp removal.  Can do at same time.  Due to size of polyp, may need done with LEEP.  Schedule in procedure room please.  DL placed order for endometrial biopsy.  Sending this to [employer], DL, and [employer].  Reviewed personally.  Felipa Emory, MD.

## 2014-12-11 ENCOUNTER — Ambulatory Visit (INDEPENDENT_AMBULATORY_CARE_PROVIDER_SITE_OTHER): Payer: BLUE CROSS/BLUE SHIELD

## 2014-12-11 ENCOUNTER — Telehealth: Payer: BLUE CROSS/BLUE SHIELD | Admitting: Certified Nurse Midwife

## 2014-12-11 DIAGNOSIS — J309 Allergic rhinitis, unspecified: Secondary | ICD-10-CM | POA: Diagnosis not present

## 2014-12-11 DIAGNOSIS — N950 Postmenopausal bleeding: Secondary | ICD-10-CM

## 2014-12-11 NOTE — Telephone Encounter (Signed)
New order for EMB placed as patient is scheduled to see Dr.Miller for this procedure.

## 2014-12-11 NOTE — Addendum Note (Signed)
Addended by: Jasmine Awe on: 12/11/2014 10:27 AM   Modules accepted: Orders

## 2014-12-11 NOTE — Addendum Note (Signed)
Addended by: Michele Mcalpine on: 12/11/2014 02:46 PM   Modules accepted: Orders

## 2014-12-11 NOTE — Telephone Encounter (Signed)
Call to patient. Advised of benefit quote received for EMB.  Patient agreeable. Scheduled procedure. Advised patient of 72 hour cancellation policy and $643 cancellation fee. Patient agreeable.

## 2014-12-15 LAB — IPS PAP TEST WITH HPV

## 2014-12-18 ENCOUNTER — Ambulatory Visit (INDEPENDENT_AMBULATORY_CARE_PROVIDER_SITE_OTHER): Payer: BLUE CROSS/BLUE SHIELD

## 2014-12-18 DIAGNOSIS — J309 Allergic rhinitis, unspecified: Secondary | ICD-10-CM

## 2014-12-25 ENCOUNTER — Ambulatory Visit (INDEPENDENT_AMBULATORY_CARE_PROVIDER_SITE_OTHER): Payer: BLUE CROSS/BLUE SHIELD

## 2014-12-25 DIAGNOSIS — J309 Allergic rhinitis, unspecified: Secondary | ICD-10-CM

## 2014-12-31 ENCOUNTER — Telehealth: Payer: Self-pay | Admitting: Internal Medicine

## 2014-12-31 ENCOUNTER — Ambulatory Visit (INDEPENDENT_AMBULATORY_CARE_PROVIDER_SITE_OTHER): Payer: BLUE CROSS/BLUE SHIELD

## 2014-12-31 ENCOUNTER — Encounter: Payer: Self-pay | Admitting: Internal Medicine

## 2014-12-31 DIAGNOSIS — J309 Allergic rhinitis, unspecified: Secondary | ICD-10-CM

## 2014-12-31 NOTE — Telephone Encounter (Signed)
Date Mixed: 12/31/2014 Vial: A Strength: 1:10 Here/Mail/Pick Up: Here Mixed By: Desmond Dike, CMA

## 2015-01-01 ENCOUNTER — Ambulatory Visit (INDEPENDENT_AMBULATORY_CARE_PROVIDER_SITE_OTHER): Payer: BLUE CROSS/BLUE SHIELD

## 2015-01-01 DIAGNOSIS — J309 Allergic rhinitis, unspecified: Secondary | ICD-10-CM | POA: Diagnosis not present

## 2015-01-08 ENCOUNTER — Ambulatory Visit (INDEPENDENT_AMBULATORY_CARE_PROVIDER_SITE_OTHER): Payer: BLUE CROSS/BLUE SHIELD

## 2015-01-08 DIAGNOSIS — J309 Allergic rhinitis, unspecified: Secondary | ICD-10-CM

## 2015-01-15 ENCOUNTER — Ambulatory Visit (INDEPENDENT_AMBULATORY_CARE_PROVIDER_SITE_OTHER): Payer: BLUE CROSS/BLUE SHIELD

## 2015-01-15 DIAGNOSIS — J309 Allergic rhinitis, unspecified: Secondary | ICD-10-CM | POA: Diagnosis not present

## 2015-01-18 ENCOUNTER — Encounter: Payer: Self-pay | Admitting: Internal Medicine

## 2015-01-18 ENCOUNTER — Ambulatory Visit (INDEPENDENT_AMBULATORY_CARE_PROVIDER_SITE_OTHER): Payer: BLUE CROSS/BLUE SHIELD | Admitting: Internal Medicine

## 2015-01-18 VITALS — BP 126/74 | HR 84 | Ht 67.0 in | Wt 256.0 lb

## 2015-01-18 DIAGNOSIS — J45998 Other asthma: Secondary | ICD-10-CM

## 2015-01-18 DIAGNOSIS — J301 Allergic rhinitis due to pollen: Secondary | ICD-10-CM

## 2015-01-18 MED ORDER — ALBUTEROL SULFATE HFA 108 (90 BASE) MCG/ACT IN AERS: 1.0000 | INHALATION_SPRAY | Freq: Four times a day (QID) | RESPIRATORY_TRACT | Status: DC | PRN

## 2015-01-18 NOTE — Progress Notes (Signed)
Subjective:    Patient ID: Gabriela Shepherd, female    DOB: 06-30-1956, 59 y.o.   MRN: [id number]  HPI 4 yoF followed for allergic rhinitis and asthma. Last here November 16, 2009 reporting a good year at that time.  Continues allergy vaccine 1:10 here and thinks the shots help "I can breathe". Continues Advair. Went months not needing Proair, but used it about 5 times in past month. One episode after strong fragrance exposure. No bad episodes. Denies any nasal symptoms- not needing antihistamines.   12/15/11- 73 yoF followed for allergic rhinitis and asthma. Last here November 16, 2009 reporting a good year at that time.  No troubles with breathing or allergies at this time; still on allergy vaccine Has done well this spring. She is satisfied that allergy vaccine helps her with no problems. Has not needed antihistamines. Denies any wheezing as long as she continues Advair with only occasional use of her rescue inhaler.  12/17/12- 27 yoF followed for allergic rhinitis and asthma. Last here November 16, 2009 reporting a good year at that time.  FOLLOWS FOR: Still on allergy vaccine 1:10 GH; itchy, watery eyes at times.  She considers allergy vaccine control good most of the time. Does not need antihistamines. Intake season has a little more rhinorrhea and itching of eyes. Denies chest tightness or wheeze while continuing Advair. Rarely needs rescue inhaler.  01/16/14- 76 yoF followed for allergic rhinitis and asthma. Last here November 16, 2009 reporting a good year at that time. FOLLOWS FOR:  Vaccine doing well.  No concerns today allergy vaccine 1:10 GH- helps and well tolerated Occasional cough or hoarseness, but no wheeze or need for rescue inhaler. Using Advair 250 once daily.  01/18/15- 75 yoF followed for allergic rhinitis and asthma. allergy vaccine 1:10 GH- stopping as of this visit Reports: doing well; no concerns No problems with the recent spring season. No wheezing off Advair now for one year  and never needs rescue inhaler. We are going to stop allergy vaccine now.  ROS-see HPI Constitutional:   No-   weight loss, night sweats, fevers, chills, fatigue, lassitude. HEENT:   No-  headaches, difficulty swallowing, tooth/dental problems, sore throat,       No-  sneezing, +itching, ear ache, +nasal congestion, post nasal drip,  CV:  No-   chest pain, orthopnea, PND, swelling in lower extremities, anasarca,   dizziness, palpitations Resp: No-   shortness of breath with exertion or at rest.              No-   productive cough,  No non-productive cough,  No- coughing up of blood.              No-   change in color of mucus.  +Little wheezing.   Skin: No-   rash or lesions. GI:  No-   heartburn, indigestion, abdominal pain, nausea, vomiting,  GU:  MS:  No-   joint pain or swelling.  . Neuro-     nothing unusual Psych:  No- change in mood or affect. No depression or anxiety.  No memory loss.  OBJ- Physical Exam General- Alert, Oriented, Affect-appropriate, Distress- none acute, overweight Skin- rash-none, lesions- none, excoriation- none Lymphadenopathy- none Head- atraumatic            Eyes- Gross vision intact, PERRLA, conjunctivae and secretions clear            Ears- Hearing, canals-normal  Nose- Clear, no-Septal dev, mucus, polyps, erosion, perforation             Throat- Mallampati II , mucosa clear , drainage- none, tonsils- atrophic Neck- flexible , trachea midline, no stridor , thyroid nl, carotid no bruit Chest - symmetrical excursion , unlabored           Heart/CV- RRR , no murmur , no gallop  , no rub, nl s1 s2                           - JVD- none , edema- none, stasis changes- none, varices- none           Lung- clear to P&A, wheeze- none, cough- none , dullness-none, rub- none           Chest wall-  Abd-  Br/ Gen/ Rectal- Not done, not indicated Extrem- cyanosis- none, clubbing, none, atrophy- none, strength- nl.  Neuro- grossly intact to  observation

## 2015-01-18 NOTE — Patient Instructions (Signed)
We are stopping allergy shots when you use up the last of your current supply  Refill script for Proair/ albuterol rescue inhaler printed to hold. If you need it refilled in the future you can call Dr Sarajane Jews  We will be happy to see you here again if needed

## 2015-01-21 ENCOUNTER — Ambulatory Visit (INDEPENDENT_AMBULATORY_CARE_PROVIDER_SITE_OTHER): Payer: BLUE CROSS/BLUE SHIELD | Admitting: Obstetrics & Gynecology

## 2015-01-21 ENCOUNTER — Encounter: Payer: Self-pay | Admitting: Obstetrics & Gynecology

## 2015-01-21 VITALS — BP 112/60 | HR 80 | Resp 20 | Ht 67.0 in | Wt 257.0 lb

## 2015-01-21 DIAGNOSIS — N950 Postmenopausal bleeding: Secondary | ICD-10-CM | POA: Diagnosis not present

## 2015-01-21 DIAGNOSIS — N841 Polyp of cervix uteri: Secondary | ICD-10-CM | POA: Diagnosis not present

## 2015-01-22 ENCOUNTER — Ambulatory Visit (INDEPENDENT_AMBULATORY_CARE_PROVIDER_SITE_OTHER): Payer: BLUE CROSS/BLUE SHIELD

## 2015-01-22 DIAGNOSIS — J309 Allergic rhinitis, unspecified: Secondary | ICD-10-CM | POA: Diagnosis not present

## 2015-01-24 NOTE — Assessment & Plan Note (Signed)
She has done extremely well over the last year. It is time now to stop allergy vaccine and watch to see how she does. Antihistamine use discussed

## 2015-01-24 NOTE — Assessment & Plan Note (Signed)
Mild intermittent well-controlled and not needing active treatment now

## 2015-01-26 ENCOUNTER — Telehealth: Payer: Self-pay | Admitting: Obstetrics & Gynecology

## 2015-01-26 NOTE — Progress Notes (Signed)
Subjective:     Patient ID: Gabriela Shepherd, female   DOB: [birth year], 59 y.o.   MRN: [id number]  HPI 59 yo G3P3 SWF here for endometrial biopsy due to PMP bleeding.  Pt last seen 12/08/14 for AEX with Debbi Hollice Espy.  Has experienced spotting off and on over the last year and thought this was nothing.  Advised otherwise and pt her for procedure.  Reviewed chart, hx with pt before proceeding.  Review of Systems  All other systems reviewed and are negative.      Objective:   Physical Exam  Constitutional: She is oriented to person, place, and time. She appears well-developed and well-nourished.  Genitourinary: There is no rash, tenderness or lesion on the right labia. There is no rash, tenderness or lesion on the left labia. Uterus is not deviated, not enlarged, not fixed and not tender. Cervix exhibits no discharge (but with large polyp prolapsing through cervix that is about 3-4cm in length and about 2cm wide). Right adnexum displays no mass and no tenderness. Left adnexum displays no mass and no tenderness. There is bleeding in the vagina.  Musculoskeletal: Normal range of motion.  Lymphadenopathy:       Right: No inguinal adenopathy present.       Left: No inguinal adenopathy present.  Neurological: She is alert and oriented to person, place, and time.  Skin: Skin is warm and dry.  Psychiatric: She has a normal mood and affect.       Assessment:     PMP bleeding Large prolapsed cervical polyp     Plan:     Due to size of polyp, excision in OR with hysteroscopy, D&C and excision of any endometrial polyps discussed.  Procedure, recovery, briefly risks and benefits reviewed.  Pt would like to proceed with scheduling.  Gay Filler not available with pt in office.  Will have pt called with information regarding surgery and scheduling.   ~20 minutes spent with patient >50% of time was in face to face discussion of above.

## 2015-01-26 NOTE — Telephone Encounter (Signed)
Called patient to review benefits for surgery. Left voicemail to call back and review. °

## 2015-01-27 NOTE — Telephone Encounter (Signed)
Spoke with patient. Reviewed benefits for surgery. Patient had additional questions. Forwarded to [employer].

## 2015-01-29 ENCOUNTER — Ambulatory Visit (INDEPENDENT_AMBULATORY_CARE_PROVIDER_SITE_OTHER): Payer: BLUE CROSS/BLUE SHIELD

## 2015-01-29 DIAGNOSIS — J309 Allergic rhinitis, unspecified: Secondary | ICD-10-CM | POA: Diagnosis not present

## 2015-02-03 NOTE — Telephone Encounter (Signed)
Call to patient to follow-up on plans for surgery. Left message to call back.  

## 2015-02-04 NOTE — Telephone Encounter (Signed)
Date Mixed: 12/31/14 Vial: 1 Strength: 1:10 Here/Mail/Pick Up: here Mixed By: tbs

## 2015-02-05 ENCOUNTER — Ambulatory Visit (INDEPENDENT_AMBULATORY_CARE_PROVIDER_SITE_OTHER): Payer: BLUE CROSS/BLUE SHIELD

## 2015-02-05 DIAGNOSIS — J309 Allergic rhinitis, unspecified: Secondary | ICD-10-CM | POA: Diagnosis not present

## 2015-02-12 ENCOUNTER — Ambulatory Visit (INDEPENDENT_AMBULATORY_CARE_PROVIDER_SITE_OTHER): Payer: BLUE CROSS/BLUE SHIELD

## 2015-02-12 DIAGNOSIS — J309 Allergic rhinitis, unspecified: Secondary | ICD-10-CM | POA: Diagnosis not present

## 2015-02-19 ENCOUNTER — Ambulatory Visit (INDEPENDENT_AMBULATORY_CARE_PROVIDER_SITE_OTHER): Payer: BLUE CROSS/BLUE SHIELD

## 2015-02-19 DIAGNOSIS — J309 Allergic rhinitis, unspecified: Secondary | ICD-10-CM | POA: Diagnosis not present

## 2015-02-23 NOTE — Telephone Encounter (Signed)
If decides to do nothing, should have an endometrial biopsy.  I didn't do this only because she was going to have procedure done in OR and I would have gotten sampling then.  If declines, I will send a letter.  Thanks.

## 2015-02-23 NOTE — Telephone Encounter (Signed)
Follow-up call to patient to discuss surgery scheduling. Patient has previously told Becky in business office that she declined to schedule. Patient states she in unemployed and is unable to schedule at this time. Reviewed this with Dr Sabra Heck and offered to attempt in office procedure with oral sedation and local anesthesia in effort to reduce cost an allow patient to proceed with procedure. Advised patient of this option and would need to be willing to accept potential possibility that if this was not successful, hospital procedure would still be needed. Patient declines for now. Has job interview next week. Would like to wait two weeks and see results of interview versus decision to return to school.  She feels she would rather have done at hospital but will decide over next two weeks and call back.   Routing to provider for final review. Please advise.

## 2015-02-24 ENCOUNTER — Encounter: Payer: Self-pay | Admitting: Family Medicine

## 2015-02-24 ENCOUNTER — Ambulatory Visit (INDEPENDENT_AMBULATORY_CARE_PROVIDER_SITE_OTHER): Payer: BLUE CROSS/BLUE SHIELD | Admitting: Family Medicine

## 2015-02-24 VITALS — BP 136/76 | HR 73 | Temp 98.1°F | Ht 67.0 in | Wt 261.0 lb

## 2015-02-24 DIAGNOSIS — S39012A Strain of muscle, fascia and tendon of lower back, initial encounter: Secondary | ICD-10-CM | POA: Diagnosis not present

## 2015-02-24 DIAGNOSIS — R109 Unspecified abdominal pain: Secondary | ICD-10-CM | POA: Diagnosis not present

## 2015-02-24 LAB — POCT URINALYSIS DIPSTICK
Bilirubin, UA: NEGATIVE
GLUCOSE UA: NEGATIVE
Ketones, UA: NEGATIVE
Leukocytes, UA: NEGATIVE
Nitrite, UA: NEGATIVE
Protein, UA: NEGATIVE
RBC UA: NEGATIVE
Spec Grav, UA: 1.02
Urobilinogen, UA: 0.2
pH, UA: 7

## 2015-02-24 MED ORDER — DICLOFENAC SODIUM 75 MG PO TBEC: 75.0000 mg | DELAYED_RELEASE_TABLET | Freq: Two times a day (BID) | ORAL | Status: DC

## 2015-02-24 NOTE — Progress Notes (Signed)
Pre visit review using our clinic review tool, if applicable. No additional management support is needed unless otherwise documented below in the visit note. 

## 2015-02-24 NOTE — Progress Notes (Signed)
   Subjective:    Patient ID: Gabriela Shepherd, female    DOB: [birth year], 59 y.o.   MRN: [id number]  HPI Here for one week of intermittent pains in the left lower back that wrap around the left side. No change in bowel or bladder habits. No fever. She is drinking water and taking Aleve.    Review of Systems  Constitutional: Negative.   Gastrointestinal: Negative.   Genitourinary: Negative.   Musculoskeletal: Positive for back pain.       Objective:   Physical Exam  Constitutional: She appears well-developed and well-nourished. No distress.  Cardiovascular: Normal rate, regular rhythm, normal heart sounds and intact distal pulses.   Pulmonary/Chest: Effort normal and breath sounds normal.  Abdominal: Soft. Bowel sounds are normal. She exhibits no distension and no mass. There is no tenderness. There is no rebound and no guarding.  Musculoskeletal:  Tender over the left lower back, not over the spine itself. ROM is full           Assessment & Plan:  Low back strain. Use heat and Diclofenac. Avoid stooping or lifting for a week

## 2015-02-26 ENCOUNTER — Ambulatory Visit (INDEPENDENT_AMBULATORY_CARE_PROVIDER_SITE_OTHER): Payer: BLUE CROSS/BLUE SHIELD

## 2015-02-26 DIAGNOSIS — J309 Allergic rhinitis, unspecified: Secondary | ICD-10-CM

## 2015-03-11 ENCOUNTER — Telehealth: Payer: Self-pay | Admitting: Obstetrics & Gynecology

## 2015-03-11 NOTE — Telephone Encounter (Signed)
See next phone encounter. Patient returned call on 03-11-15 and left message with receptionist that she would call next week to schedule.  Routing to provider for final review.  Will close encounter.

## 2015-03-11 NOTE — Telephone Encounter (Signed)
Follow-up call to patient. Left message, voice mail confirms first and last name, calling to get update after our last talk. Left message to call back.

## 2015-03-11 NOTE — Telephone Encounter (Signed)
Patient returning sally's call. Patient says she will contact office on next week about scheduling procedure appointment.

## 2015-03-12 ENCOUNTER — Ambulatory Visit (INDEPENDENT_AMBULATORY_CARE_PROVIDER_SITE_OTHER): Payer: BLUE CROSS/BLUE SHIELD

## 2015-03-12 DIAGNOSIS — J309 Allergic rhinitis, unspecified: Secondary | ICD-10-CM

## 2015-03-18 NOTE — Telephone Encounter (Signed)
Return call to patient. She states she is ready to proceed with scheduling. Date options and scheduling policies discussed. Patient agreeable. Surgery instructions sheet reviewed and printed copy mailed to patient; see scanned copy.  Surgery planned for 04-05-15 at 1100 at [facility]. Case request submitted.

## 2015-03-18 NOTE — Telephone Encounter (Signed)
Patient calling ready to schedule surgery.

## 2015-03-18 NOTE — Telephone Encounter (Signed)
Surgery posting confirmed. Routing to provider for final review. Will close encounter.

## 2015-03-19 ENCOUNTER — Other Ambulatory Visit: Payer: Self-pay | Admitting: Obstetrics & Gynecology

## 2015-03-19 ENCOUNTER — Ambulatory Visit (INDEPENDENT_AMBULATORY_CARE_PROVIDER_SITE_OTHER): Payer: BLUE CROSS/BLUE SHIELD

## 2015-03-19 ENCOUNTER — Encounter: Payer: Self-pay | Admitting: Internal Medicine

## 2015-03-19 DIAGNOSIS — J309 Allergic rhinitis, unspecified: Secondary | ICD-10-CM

## 2015-03-23 ENCOUNTER — Ambulatory Visit: Payer: BLUE CROSS/BLUE SHIELD | Admitting: Obstetrics & Gynecology

## 2015-03-23 ENCOUNTER — Ambulatory Visit (INDEPENDENT_AMBULATORY_CARE_PROVIDER_SITE_OTHER): Payer: BLUE CROSS/BLUE SHIELD | Admitting: Obstetrics & Gynecology

## 2015-03-23 VITALS — BP 142/72 | HR 72 | Resp 16 | Wt 263.0 lb

## 2015-03-23 DIAGNOSIS — N841 Polyp of cervix uteri: Secondary | ICD-10-CM

## 2015-03-23 DIAGNOSIS — N950 Postmenopausal bleeding: Secondary | ICD-10-CM | POA: Diagnosis not present

## 2015-03-23 MED ORDER — HYDROCODONE-ACETAMINOPHEN 5-325 MG PO TABS: 1.0000 | ORAL_TABLET | Freq: Four times a day (QID) | ORAL | Status: DC | PRN

## 2015-03-23 NOTE — Progress Notes (Signed)
59 y.o. [id number] SingleCaucasian female here for discussion of upcoming procedure.  D&C/Hysteroscopy with resectocope planned due to postmenopausal bleeding and large cervical polyp noted on initial evaluation.  Pt has not undergone endometrial biopsy or ultrasound as uterus is normal on exam and we initially planned this in June but pt has postponed due to work issues.  She is aware, as additional testing has not been done in the office, that hysteroscopy with D&C and polyp removal of the cervical polyp will be performe.d  Pt did have normla pap 5/16 with neg HR HPV.    Procedure discussed with patient.  Recovery and pain management discussed.  Risks discussed including but not limited to bleeding, rare risk of transfusion, infection, 1% risk of uterine perforation with risks of fluid deficit causing cardiac arrythmia, cerebral swelling and/or need to stop procedure early.  Fluid emboli and rare risk of death discussed.  DVT/PE, rare risk of risk of bowel/bladder/ureteral/vascular injury.  Patient aware if pathology abnormal she may need additional treatment.  All questions answered.    Ob Hx:   Patient's last menstrual period was 11/21/2009.          Sexually active: No. Birth control: bilateral tubal ligation Last pap: 12/20/14 WNL/negative HR HPV Last MMG: 04/09/14 BiRads 1-negative Tobacco: no  Past Surgical History  Procedure Laterality Date  . Bunionectomy    . Colonoscopy  11-27-08    per Dr. Olevia Perches, hyperplastic polyps, repeat in 7 yrs   . Hysteroscopic polyp resection    . Knee arthroscopy    . Tubal ligation    . Refractive surgery      related to hole in back of left eye as stated per pt   . 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 Anxiety   . Asthma     sees Dr. Annamaria Boots  . GERD (gastroesophageal reflux  disease)   . Hypertension   . Allergic rhinitis     sees Dr. Annamaria Boots  . Osteoarthritis   . Carpal tunnel syndrome     sees Dr. Amedeo Plenty  . Gynecological examination     sees Dr. Elyse Hsu  . Headache(784.0)     hx of pt states BP meds has improved     Allergies: Amoxicillin and Penicillins  Current Outpatient Prescriptions  Medication Sig Dispense Refill  . albuterol (PROAIR HFA) 108 (90 BASE) MCG/ACT inhaler Inhale 1-2 puffs into the lungs every 6 (six) hours as needed for wheezing or shortness of breath. 1 Inhaler 11  . diclofenac (VOLTAREN) 75 MG EC tablet Take 1 tablet (75 mg total) by mouth 2 (two) times daily. 60 tablet 2  . ibuprofen (ADVIL,MOTRIN) 200 MG tablet Take 200 mg by mouth every 6 (six) hours as needed.    . menthol-cetylpyridinium (CEPACOL REGULAR STRENGTH) 3 MG lozenge Take 1 lozenge by mouth as needed for sore throat.    . Multiple Vitamin (MULTIVITAMIN) tablet Take 1 tablet by mouth every morning.     . NON FORMULARY Allergy vaccines 1:10 weekly GH    . pantoprazole (PROTONIX) 40 MG tablet Take 1 tablet (40 mg total) by mouth every morning. 90 tablet 3  . Trolamine Salicylate (ASPERCREME EX) Apply 1 application topically once as needed (knee pain.).    Marland Kitchen valsartan-hydrochlorothiazide (DIOVAN-HCT) 160-25 MG per tablet Take 1 tablet  by mouth daily. 90 tablet 3  . verapamil (CALAN) 120 MG tablet Take 1 tablet (120 mg total) by mouth daily. 90 tablet 3   No current facility-administered medications for this visit.    ROS: A comprehensive review of systems was negative.  Exam:    BP 142/72 mmHg  Pulse 72  Resp 16  Wt 263 lb (119.296 kg)  LMP 11/21/2009  General appearance: alert and cooperative Head: Normocephalic, without obvious abnormality, atraumatic Neck: no adenopathy, supple, symmetrical, trachea midline and thyroid not enlarged, symmetric, no tenderness/mass/nodules Lungs: clear to auscultation bilaterally Heart: regular rate and rhythm, S1, S2  normal, no murmur, click, rub or gallop Abdomen: soft, non-tender; bowel sounds normal; no masses,  no organomegaly Extremities: extremities normal, atraumatic, no cyanosis or edema Skin: Skin color, texture, turgor normal. No rashes or lesions Lymph nodes: Cervical, supraclavicular, and axillary nodes normal. no inguinal nodes palpated Neurologic: Grossly normal  Pelvic: External genitalia:  no lesions              Urethra: normal appearing urethra with no masses, tenderness or lesions              Bartholins and Skenes: Bartholin's, Urethra, Skene's normal                 Vagina: normal appearing vagina with normal color and discharge, no lesions              Cervix: large 3cm polyp noted at cervix              Pap taken: No.        Bimanual Exam:  Uterus:  uterus is normal size, shape, consistency and nontender                                      Adnexa:    normal adnexa in size, nontender and no masses                                      Rectovaginal: Deferred                                      Anus:  normal sphincter tone, no lesions  A: PMP bleeding  Cervical polyp Allergy related asthma Low back pain, takes diclofenac typically once or twice weekly  P: Hysteroscopy with D&C, cervical polyp resection Rx for Vicodin given. Medications/Vitamins reviewed.  Outpatient surgery brochure given for pre and post op instructions.  ~25 minutes spent with patient >50% of time was in face to face discussion of above.

## 2015-03-24 ENCOUNTER — Telehealth: Payer: Self-pay | Admitting: Obstetrics & Gynecology

## 2015-03-24 ENCOUNTER — Encounter: Payer: Self-pay | Admitting: Obstetrics & Gynecology

## 2015-03-24 NOTE — Telephone Encounter (Signed)
Called patient to verify surgical benefits. Spoke with patient. She understands and is agreeable. See guarantor notes. Patient to call back with payment.

## 2015-03-25 NOTE — Telephone Encounter (Signed)
Call to patient to confirm scheduled start time of surgery is 1100 am and arrival time of 930 am at [facility]

## 2015-03-25 NOTE — Telephone Encounter (Signed)
-----   Message from Megan Salon, MD sent at 03/23/2015  5:24 PM EDT ----- Regarding: surgery time Gay Filler, When Mrs. Juniel was in the office today.  She said her surgery was scheduled for 10 and she was to be at the hospital at [place].  I have 11 am as her scheduled time and I may end up using all of that with the case that is before it.  So, can you let her know if indeed it is 11 o'clock.  That is what I would prefer.  Thanks.  Vinnie Level

## 2015-03-26 ENCOUNTER — Ambulatory Visit (INDEPENDENT_AMBULATORY_CARE_PROVIDER_SITE_OTHER): Payer: BLUE CROSS/BLUE SHIELD

## 2015-03-26 DIAGNOSIS — J309 Allergic rhinitis, unspecified: Secondary | ICD-10-CM

## 2015-03-30 ENCOUNTER — Encounter (HOSPITAL_COMMUNITY)
Admission: RE | Admit: 2015-03-30 | Discharge: 2015-03-30 | Disposition: A | Discharge status: Home / Self Care | Payer: BLUE CROSS/BLUE SHIELD | Source: Ambulatory Visit | Attending: Obstetrics & Gynecology | Admitting: Obstetrics & Gynecology

## 2015-03-30 ENCOUNTER — Encounter (HOSPITAL_COMMUNITY): Payer: Self-pay

## 2015-03-30 DIAGNOSIS — K219 Gastro-esophageal reflux disease without esophagitis: Secondary | ICD-10-CM | POA: Insufficient documentation

## 2015-03-30 DIAGNOSIS — Z01812 Encounter for preprocedural laboratory examination: Secondary | ICD-10-CM | POA: Insufficient documentation

## 2015-03-30 DIAGNOSIS — I10 Essential (primary) hypertension: Secondary | ICD-10-CM | POA: Insufficient documentation

## 2015-03-30 LAB — CBC
HEMATOCRIT: 40.6 % (ref 36.0–46.0)
Hemoglobin: 13.6 g/dL (ref 12.0–15.0)
MCH: 29.2 pg (ref 26.0–34.0)
MCHC: 33.5 g/dL (ref 30.0–36.0)
MCV: 87.3 fL (ref 78.0–100.0)
PLATELETS: 219 10*3/uL (ref 150–400)
RBC: 4.65 MIL/uL (ref 3.87–5.11)
RDW: 13.8 % (ref 11.5–15.5)
WBC: 7.3 10*3/uL (ref 4.0–10.5)

## 2015-03-30 LAB — BASIC METABOLIC PANEL
Anion gap: 9 (ref 5–15)
BUN: 18 mg/dL (ref 6–20)
CALCIUM: 9.5 mg/dL (ref 8.9–10.3)
CO2: 30 mmol/L (ref 22–32)
Chloride: 99 mmol/L — ABNORMAL LOW (ref 101–111)
Creatinine, Ser: 0.68 mg/dL (ref 0.44–1.00)
GFR calc Af Amer: >60 mL/min (ref >60)
GLUCOSE: 74 mg/dL (ref 65–99)
POTASSIUM: 3.6 mmol/L (ref 3.5–5.1)
Sodium: 138 mmol/L (ref 135–145)

## 2015-03-30 NOTE — Patient Instructions (Signed)
Your procedure is scheduled on: April 05, 2015    Enter through the Main Entrance of [facility] at:  9:30 am   Pick up the phone at the desk and dial [phone].  Call this number if you have problems the morning of surgery: [phone].  Remember: Do NOT eat food:  After midnight on Sunday  Do NOT drink clear liquids after:  Midnight on Sunday  Take these medicines the morning of surgery with a SIP OF WATER:  Protonix, verapamil, and diovan-Hct Bring inhaler day of surgery    Do NOT wear jewelry (body piercing), metal hair clips/bobby pins, make-up, or nail polish. Do NOT wear lotions, powders, or perfumes.  You may wear deoderant. Do NOT shave for 48 hours prior to surgery. Do NOT bring valuables to the hospital. Contacts, dentures, or bridgework may not be worn into surgery. Have a responsible adult drive you home and stay with you for 24 hours after your procedure

## 2015-04-02 ENCOUNTER — Ambulatory Visit (INDEPENDENT_AMBULATORY_CARE_PROVIDER_SITE_OTHER): Payer: BLUE CROSS/BLUE SHIELD

## 2015-04-02 DIAGNOSIS — J309 Allergic rhinitis, unspecified: Secondary | ICD-10-CM | POA: Diagnosis not present

## 2015-04-04 MED ORDER — METRONIDAZOLE IN NACL 5-0.79 MG/ML-% IV SOLN
500.0000 mg | INTRAVENOUS | Status: AC
  Administered 2015-04-05: 500 mg via INTRAVENOUS
  Filled 2015-04-04: qty 100

## 2015-04-05 ENCOUNTER — Encounter (HOSPITAL_COMMUNITY): Payer: Self-pay | Admitting: Anesthesiology

## 2015-04-05 ENCOUNTER — Ambulatory Visit (HOSPITAL_COMMUNITY)
Admission: RE | Admit: 2015-04-05 | Discharge: 2015-04-05 | Disposition: A | Discharge status: Home / Self Care | Payer: BLUE CROSS/BLUE SHIELD | Source: Ambulatory Visit | Attending: Obstetrics & Gynecology | Admitting: Obstetrics & Gynecology

## 2015-04-05 ENCOUNTER — Ambulatory Visit (HOSPITAL_COMMUNITY): Payer: BLUE CROSS/BLUE SHIELD | Admitting: Anesthesiology

## 2015-04-05 ENCOUNTER — Encounter (HOSPITAL_COMMUNITY)
Admission: RE | Disposition: A | Discharge status: Home / Self Care | Payer: Self-pay | Source: Ambulatory Visit | Attending: Obstetrics & Gynecology

## 2015-04-05 DIAGNOSIS — J45909 Unspecified asthma, uncomplicated: Secondary | ICD-10-CM | POA: Diagnosis not present

## 2015-04-05 DIAGNOSIS — Z880 Allergy status to penicillin: Secondary | ICD-10-CM | POA: Diagnosis not present

## 2015-04-05 DIAGNOSIS — N841 Polyp of cervix uteri: Secondary | ICD-10-CM | POA: Insufficient documentation

## 2015-04-05 DIAGNOSIS — K219 Gastro-esophageal reflux disease without esophagitis: Secondary | ICD-10-CM | POA: Diagnosis not present

## 2015-04-05 DIAGNOSIS — Z6838 Body mass index (BMI) 38.0-38.9, adult: Secondary | ICD-10-CM | POA: Diagnosis not present

## 2015-04-05 DIAGNOSIS — N950 Postmenopausal bleeding: Secondary | ICD-10-CM | POA: Insufficient documentation

## 2015-04-05 DIAGNOSIS — I10 Essential (primary) hypertension: Secondary | ICD-10-CM | POA: Insufficient documentation

## 2015-04-05 HISTORY — PX: DILATATION & CURRETTAGE/HYSTEROSCOPY WITH RESECTOCOPE: SHX5572

## 2015-04-05 HISTORY — PX: CERVICAL POLYPECTOMY: SHX88

## 2015-04-05 SURGERY — DILATATION & CURETTAGE/HYSTEROSCOPY WITH RESECTOCOPE
Anesthesia: General | Site: Uterus

## 2015-04-05 MED ORDER — FENTANYL CITRATE (PF) 100 MCG/2ML IJ SOLN
INTRAMUSCULAR | Status: DC | PRN
  Administered 2015-04-05: 100 ug via INTRAVENOUS
  Administered 2015-04-05 (×2): 50 ug via INTRAVENOUS

## 2015-04-05 MED ORDER — OXYCODONE HCL 5 MG/5ML PO SOLN: 5.0000 mg | Freq: Once | ORAL | Status: DC | PRN

## 2015-04-05 MED ORDER — GLYCOPYRROLATE 0.2 MG/ML IJ SOLN
INTRAMUSCULAR | Status: DC | PRN
  Administered 2015-04-05: 0.2 mg via INTRAVENOUS

## 2015-04-05 MED ORDER — LIDOCAINE HCL (CARDIAC) 20 MG/ML IV SOLN
INTRAVENOUS | Status: DC | PRN
  Administered 2015-04-05: 80 mg via INTRAVENOUS

## 2015-04-05 MED ORDER — GLYCOPYRROLATE 0.2 MG/ML IJ SOLN
INTRAMUSCULAR | Status: AC
  Filled 2015-04-05: qty 1

## 2015-04-05 MED ORDER — ONDANSETRON HCL 4 MG/2ML IJ SOLN
INTRAMUSCULAR | Status: AC
  Filled 2015-04-05: qty 2

## 2015-04-05 MED ORDER — ONDANSETRON HCL 4 MG/2ML IJ SOLN: 4.0000 mg | Freq: Once | INTRAMUSCULAR | Status: DC | PRN

## 2015-04-05 MED ORDER — OXYCODONE HCL 5 MG PO TABS: 5.0000 mg | ORAL_TABLET | Freq: Once | ORAL | Status: DC | PRN

## 2015-04-05 MED ORDER — PROPOFOL 10 MG/ML IV BOLUS
INTRAVENOUS | Status: AC
  Filled 2015-04-05: qty 20

## 2015-04-05 MED ORDER — ACETAMINOPHEN 160 MG/5ML PO SOLN: 325.0000 mg | ORAL | Status: DC | PRN

## 2015-04-05 MED ORDER — FENTANYL CITRATE (PF) 100 MCG/2ML IJ SOLN
INTRAMUSCULAR | Status: AC
  Filled 2015-04-05: qty 2

## 2015-04-05 MED ORDER — LIDOCAINE HCL (CARDIAC) 20 MG/ML IV SOLN
INTRAVENOUS | Status: AC
  Filled 2015-04-05: qty 5

## 2015-04-05 MED ORDER — DEXAMETHASONE SODIUM PHOSPHATE 10 MG/ML IJ SOLN
INTRAMUSCULAR | Status: DC | PRN
  Administered 2015-04-05: 10 mg via INTRAVENOUS

## 2015-04-05 MED ORDER — LIDOCAINE-EPINEPHRINE 1 %-1:100000 IJ SOLN
INTRAMUSCULAR | Status: DC | PRN
  Administered 2015-04-05: 12 mL

## 2015-04-05 MED ORDER — FENTANYL CITRATE (PF) 100 MCG/2ML IJ SOLN
INTRAMUSCULAR | Status: AC
  Filled 2015-04-05: qty 4

## 2015-04-05 MED ORDER — ACETAMINOPHEN 325 MG PO TABS: 325.0000 mg | ORAL_TABLET | ORAL | Status: DC | PRN

## 2015-04-05 MED ORDER — ONDANSETRON HCL 4 MG/2ML IJ SOLN
INTRAMUSCULAR | Status: DC | PRN
  Administered 2015-04-05: 4 mg via INTRAVENOUS

## 2015-04-05 MED ORDER — LIDOCAINE-EPINEPHRINE 1 %-1:100000 IJ SOLN
INTRAMUSCULAR | Status: AC
  Filled 2015-04-05: qty 1

## 2015-04-05 MED ORDER — MIDAZOLAM HCL 2 MG/2ML IJ SOLN
INTRAMUSCULAR | Status: DC | PRN
  Administered 2015-04-05: 2 mg via INTRAVENOUS

## 2015-04-05 MED ORDER — DEXAMETHASONE SODIUM PHOSPHATE 10 MG/ML IJ SOLN
INTRAMUSCULAR | Status: AC
  Filled 2015-04-05: qty 1

## 2015-04-05 MED ORDER — GLYCINE 1.5 % IR SOLN
Status: DC | PRN
  Administered 2015-04-05: 3000 mL

## 2015-04-05 MED ORDER — LACTATED RINGERS IV SOLN
INTRAVENOUS | Status: DC
  Administered 2015-04-05 (×2): via INTRAVENOUS

## 2015-04-05 MED ORDER — FENTANYL CITRATE (PF) 100 MCG/2ML IJ SOLN: 25.0000 ug | INTRAMUSCULAR | Status: DC | PRN

## 2015-04-05 MED ORDER — PROPOFOL 10 MG/ML IV BOLUS
INTRAVENOUS | Status: DC | PRN
  Administered 2015-04-05: 200 mg via INTRAVENOUS

## 2015-04-05 MED ORDER — MIDAZOLAM HCL 2 MG/2ML IJ SOLN
INTRAMUSCULAR | Status: AC
  Filled 2015-04-05: qty 4

## 2015-04-05 MED ORDER — MEPERIDINE HCL 25 MG/ML IJ SOLN: 6.2500 mg | INTRAMUSCULAR | Status: DC | PRN

## 2015-04-05 SURGICAL SUPPLY — 22 items
CANISTER SUCT 3000ML (MISCELLANEOUS) ×3 IMPLANT
CATH ROBINSON RED A/P 16FR (CATHETERS) ×3 IMPLANT
CLOTH BEACON ORANGE TIMEOUT ST (SAFETY) ×3 IMPLANT
CONTAINER PREFILL 10% NBF 60ML (FORM) ×6 IMPLANT
DECANTER SPIKE VIAL GLASS SM (MISCELLANEOUS) ×3 IMPLANT
DILATOR CANAL MILEX (MISCELLANEOUS) IMPLANT
ELECT BLADE 6.5 EXT (BLADE) ×3 IMPLANT
ELECT REM PT RETURN 9FT ADLT (ELECTROSURGICAL) ×3
ELECTRODE REM PT RTRN 9FT ADLT (ELECTROSURGICAL) ×2 IMPLANT
GLOVE BIOGEL PI IND STRL 7.0 (GLOVE) ×2 IMPLANT
GLOVE BIOGEL PI INDICATOR 7.0 (GLOVE) ×1
GLOVE ECLIPSE 6.5 STRL STRAW (GLOVE) ×6 IMPLANT
GLOVE SURG SS PI 7.0 STRL IVOR (GLOVE) ×21 IMPLANT
GOWN STRL REUS W/TWL LRG LVL3 (GOWN DISPOSABLE) ×6 IMPLANT
LOOP ANGLED CUTTING 22FR (CUTTING LOOP) IMPLANT
PACK VAGINAL MINOR WOMEN LF (CUSTOM PROCEDURE TRAY) ×3 IMPLANT
PAD OB MATERNITY 4.3X12.25 (PERSONAL CARE ITEMS) ×3 IMPLANT
PENCIL BUTTON HOLSTER BLD 10FT (ELECTRODE) ×3 IMPLANT
TOWEL OR 17X24 6PK STRL BLUE (TOWEL DISPOSABLE) ×6 IMPLANT
TUBING AQUILEX INFLOW (TUBING) ×3 IMPLANT
TUBING AQUILEX OUTFLOW (TUBING) ×3 IMPLANT
WATER STERILE IRR 1000ML POUR (IV SOLUTION) ×3 IMPLANT

## 2015-04-05 NOTE — Anesthesia Procedure Notes (Signed)
Procedure Name: LMA Insertion Date/Time: 04/05/2015 10:52 AM Performed by: Megan Salon Pre-anesthesia Checklist: Patient identified, Emergency Drugs available, Suction available and Patient being monitored Patient Re-evaluated:Patient Re-evaluated prior to inductionOxygen Delivery Method: Circle system utilized Preoxygenation: Pre-oxygenation with 100% oxygen Intubation Type: IV induction Ventilation: Mask ventilation without difficulty LMA: LMA inserted LMA Size: 4.0 Number of attempts: 2

## 2015-04-05 NOTE — Anesthesia Preprocedure Evaluation (Signed)
Anesthesia Evaluation  Patient identified by MRN, date of birth, ID band Patient awake    Reviewed: Allergy & Precautions, H&P , NPO status , Patient's Chart, lab work & pertinent test results, reviewed documented beta blocker date and time   Airway Mallampati: II  TM Distance: >3 FB Neck ROM: full    Dental no notable dental hx. (+) Teeth Intact   Pulmonary  Will use inhaler prior to OR   Pulmonary exam normal        Cardiovascular hypertension, Pt. on medications Normal cardiovascular exam     Neuro/Psych    GI/Hepatic Neg liver ROS, GERD  Controlled,  Endo/Other  Morbid obesity  Renal/GU negative Renal ROS     Musculoskeletal   Abdominal (+) + obese,   Peds  Hematology negative hematology ROS (+)   Anesthesia Other Findings   Reproductive/Obstetrics negative OB ROS                             Anesthesia Physical Anesthesia Plan  ASA: III  Anesthesia Plan: General   Post-op Pain Management:    Induction: Intravenous  Airway Management Planned: LMA  Additional Equipment:   Intra-op Plan:   Post-operative Plan:   Informed Consent: I have reviewed the patients History and Physical, chart, labs and discussed the procedure including the risks, benefits and alternatives for the proposed anesthesia with the patient or authorized representative who has indicated his/her understanding and acceptance.     Plan Discussed with: CRNA and Surgeon  Anesthesia Plan Comments:         Anesthesia Quick Evaluation

## 2015-04-05 NOTE — H&P (Signed)
Gabriela Shepherd is an 59 y.o. female G3P3 SWF here for hysteroscopy, polyp resection, and D&C due to large cervical polyp and post menopausal bleeding.  In discussing options with pt once polyp was noted, ultrasound with endometrial biopsy vs proceeding with OR procedure discussed.  Pt opted for procedure planned today.  Pt has delayed in scheduling due to some work issues which are now improved.  Procedure, risks and benefits have all been discussed.  Pt is here and ready to proceed.  Pertinent Gynecological History: Menses: post-menopausal Bleeding: post menopausal bleeding Contraception: tubal ligation DES exposure: denies Blood transfusions: none Sexually transmitted diseases: no past history Previous GYN Procedures: none  Last mammogram: normal Date: 04/09/14 Last pap: normal Date: 12/20/14 OB History: G3, P3   Menstrual History: Patient's last menstrual period was 11/21/2009.    Past Medical History  Diagnosis Date  . Anxiety   . Asthma     sees Dr. Annamaria Boots  . GERD (gastroesophageal reflux disease)   . Hypertension   . Allergic rhinitis     sees Dr. Annamaria Boots  . Osteoarthritis   . Carpal tunnel syndrome     sees Dr. Amedeo Plenty  . Gynecological examination     sees Dr. Elyse Hsu  . Headache(784.0)     hx of pt states BP meds has improved     Past Surgical History  Procedure Laterality Date  . Bunionectomy    . Colonoscopy  11-27-08    per Dr. Olevia Perches, hyperplastic polyps, repeat in 7 yrs   . Hysteroscopic polyp resection    . Knee arthroscopy    . Tubal ligation    . Refractive surgery      related to hole in back of left eye as stated per pt   . Total knee arthroplasty Left 05/04/2014    Procedure: LEFT TOTAL KNEE ARTHROPLASTY;  Surgeon: Mauri Pole, MD;  Location: [facility];  Service: Orthopedics;  Laterality: Left;  . Carpal tunnel release      Family History  Problem Relation Age of Onset  . Liver cancer Mother     deceased   . Hypertension Mother   . Heart  disease Father   . Hypertension Father   . Diabetes Maternal Grandmother   . Cancer Maternal Aunt     Social History:  reports that she has never smoked. She has never used smokeless tobacco. She reports that she drinks alcohol. She reports that she does not use illicit drugs.  Allergies:  Allergies  Allergen Reactions  . Amoxicillin     REACTION: rash, itching  . Penicillins Rash    No prescriptions prior to admission    Review of Systems  All other systems reviewed and are negative.   Last menstrual period 11/21/2009. Physical Exam  Constitutional: She is oriented to person, place, and time. She appears well-developed and well-nourished.  Cardiovascular: Normal rate and regular rhythm.   Respiratory: Effort normal and breath sounds normal.  Neurological: She is alert and oriented to person, place, and time.  Skin: Skin is warm and dry.  Psychiatric: She has a normal mood and affect.    No results found for this or any previous visit (from the past 24 hour(s)).  No results found.  Assessment/Plan: 59 yo G3P3 SWF with PMP bleeding and large cervical polyp here for hysteroscopy, polyp resection, D&C.  All questions answered.    [facility] 04/05/2015, 7:19 AM

## 2015-04-05 NOTE — Transfer of Care (Signed)
Immediate Anesthesia Transfer of Care Note  Patient: Gabriela Shepherd  Procedure(s) Performed: Procedure(s): DILATATION & CURETTAGE/HYSTEROSCOPY  (N/A) CERVICAL POLYPECTOMY removal  (N/A)  Patient Location: PACU  Anesthesia Type:General  Level of Consciousness: awake, alert  and oriented  Airway & Oxygen Therapy: Patient Spontanous Breathing and Patient connected to nasal cannula oxygen  Post-op Assessment: Report given to RN and Post -op Vital signs reviewed and stable  Post vital signs: Reviewed and stable  Last Vitals:  Filed Vitals:   04/05/15 0944  BP: 137/66  Pulse: 77  Temp: 37.1 C  Resp: 20    Complications: No apparent anesthesia complications

## 2015-04-05 NOTE — Op Note (Signed)
04/05/2015  11:56 AM  PATIENT:  Gabriela Shepherd  59 y.o. female  PRE-OPERATIVE DIAGNOSIS:  Post Menopausal Bleeding, large cervical polyp  POST-OPERATIVE DIAGNOSIS:  Post Menopausal Bleeding, large cervical polyp  PROCEDURE:  Procedure(s): DILATATION & CURETTAGE/HYSTEROSCOPY  CERVICAL POLYPECTOMY removal   SURGEON:  Kayron Hicklin SUZANNE  ASSISTANTS: OR staff   ANESTHESIA:   general, LMA  ESTIMATED BLOOD LOSS: 10cc  BLOOD ADMINISTERED:none   FLUIDS: 1100cc LR  UOP: 100cc clear  SPECIMEN:  Large cervical polyp, endometrial curettings  DISPOSITION OF SPECIMEN:  PATHOLOGY  FINDINGS: large cervical polyp arising from endocervix.  Thin endometrium, no intracavitary pathology.  DESCRIPTION OF OPERATION: Patient was taken to the operating room.  She is placed in the supine position. SCDs were on her lower extremities and functioning properly. General anesthesia with an LMA was administered without difficulty.  Legs were then placed in the [place] in the low lithotomy position. The legs were lifted to the high lithotomy position and the Betadine prep was used on the inner thighs perineum and vagina x3. Patient was draped in a normal standard fashion. An in and out catheterization with a red rubber Foley catheter was performed. Approximately 100 cc of clear urine was noted. A bivalve speculum was placed the vagina. The anterior lip of the cervix was grasped with single-tooth tenaculum.  A large cervical polyp about the size of my thumb was noted.  A wide base was noted.  A paracervical block of 1% lidocaine mixed one-to-one with epinephrine (1:100,000 units).  10 cc was used total.  The polyp was grasped and retracted.  Using long curved mayo scissors, the base of the polyp was incised, freeing it.  This was handed off to be sent for pathologic analysis.  Then the cervix is dilated up to #21 [facility] dilators. The endometrial cavity sounded to 7 cm.   A 2.9 millimeter diagnostic  hysteroscope was obtained. 1.5% glycine was used as a hysteroscopic fluid. The hysteroscope was advanced through the endocervical canal into the endometrial cavity. The tubal ostia were noted bilaterally. The endometrium was thin.  Photo-documentation was made.  Using a #1 smooth curette, the endometrial cavity was curetted until a rough gritty texture was noted.  A second pathology specimen was handed off.  At this point, the hysteroscopic portion of the procedure was ended.  The base where the polyp was removed was cauterized.  Excellent hemostasis was noted.  The fluid deficit was 100 cc. The tenaculum was removed from the anterior lip of the cervix. The speculum was removed from the vagina. The prep was cleansed of the patient's skin. The legs are positioned back in the supine position. Sponge, lap, needle, initially counts were correct x2. Patient was taken to recovery in stable condition.  COUNTS:  YES  PLAN OF CARE: Transfer to PACU

## 2015-04-05 NOTE — Anesthesia Postprocedure Evaluation (Signed)
Anesthesia Post Note  Patient: Gabriela Shepherd  Procedure(s) Performed: Procedure(s) (LRB): DILATATION & CURETTAGE/HYSTEROSCOPY  (N/A) CERVICAL POLYPECTOMY removal  (N/A)  Anesthesia type: General  Patient location: PACU  Post pain: Pain level controlled  Post assessment: Post-op Vital signs reviewed  Last Vitals:  Filed Vitals:   04/05/15 1215  BP: 122/68  Pulse: 63  Temp:   Resp: 16    Post vital signs: Reviewed  Level of consciousness: sedated  Complications: No apparent anesthesia complications

## 2015-04-06 ENCOUNTER — Encounter (HOSPITAL_COMMUNITY): Payer: Self-pay | Admitting: Obstetrics & Gynecology

## 2015-04-07 ENCOUNTER — Other Ambulatory Visit: Payer: Self-pay | Admitting: Certified Nurse Midwife

## 2015-04-07 DIAGNOSIS — Z1231 Encounter for screening mammogram for malignant neoplasm of breast: Secondary | ICD-10-CM

## 2015-04-09 ENCOUNTER — Ambulatory Visit (INDEPENDENT_AMBULATORY_CARE_PROVIDER_SITE_OTHER): Payer: BLUE CROSS/BLUE SHIELD

## 2015-04-09 DIAGNOSIS — J309 Allergic rhinitis, unspecified: Secondary | ICD-10-CM

## 2015-04-13 ENCOUNTER — Ambulatory Visit (HOSPITAL_COMMUNITY): Payer: BLUE CROSS/BLUE SHIELD

## 2015-04-14 ENCOUNTER — Telehealth: Payer: Self-pay | Admitting: Obstetrics & Gynecology

## 2015-04-14 NOTE — Telephone Encounter (Signed)
Patient canceled her 2 week po appointment 04/16/15. Patient said she was told she did not need to keep this appointment if she was feeling better.

## 2015-04-15 ENCOUNTER — Ambulatory Visit (HOSPITAL_COMMUNITY)
Admission: RE | Admit: 2015-04-15 | Discharge: 2015-04-15 | Disposition: A | Discharge status: Home / Self Care | Payer: BLUE CROSS/BLUE SHIELD | Source: Ambulatory Visit | Attending: Certified Nurse Midwife | Admitting: Certified Nurse Midwife

## 2015-04-15 DIAGNOSIS — Z1231 Encounter for screening mammogram for malignant neoplasm of breast: Secondary | ICD-10-CM | POA: Diagnosis present

## 2015-04-16 ENCOUNTER — Ambulatory Visit (INDEPENDENT_AMBULATORY_CARE_PROVIDER_SITE_OTHER): Payer: BLUE CROSS/BLUE SHIELD

## 2015-04-16 ENCOUNTER — Ambulatory Visit: Payer: BLUE CROSS/BLUE SHIELD | Admitting: Obstetrics & Gynecology

## 2015-04-16 DIAGNOSIS — J309 Allergic rhinitis, unspecified: Secondary | ICD-10-CM

## 2015-04-19 ENCOUNTER — Encounter: Payer: Self-pay | Admitting: Gynecologic Oncology

## 2015-04-19 ENCOUNTER — Ambulatory Visit: Payer: BLUE CROSS/BLUE SHIELD | Attending: Gynecologic Oncology | Admitting: Gynecologic Oncology

## 2015-04-19 VITALS — BP 137/67 | HR 73 | Temp 97.9°F | Resp 18 | Ht 69.0 in | Wt 262.4 lb

## 2015-04-19 DIAGNOSIS — E669 Obesity, unspecified: Secondary | ICD-10-CM | POA: Insufficient documentation

## 2015-04-19 DIAGNOSIS — N8502 Endometrial intraepithelial neoplasia [EIN]: Secondary | ICD-10-CM

## 2015-04-19 DIAGNOSIS — Z6838 Body mass index (BMI) 38.0-38.9, adult: Secondary | ICD-10-CM | POA: Insufficient documentation

## 2015-04-19 NOTE — Progress Notes (Signed)
Consult Note: Gyn-Onc  Consult was requested by Dr. Sabra Heck for the evaluation of Gabriela Shepherd 59 y.o. female with CAH  CC:  Chief Complaint  Patient presents with  . complex endometrial hyperplasia with atypia    New consult    Assessment/Plan:  Gabriela Shepherd  is a 59 y.o.  year old with complex endometrial hyperplasia and a benign cervical polyp (excised). She has obesity (BMI 39kg/m2).   I discussed with the patient that we recommend hysterectomy with BSO as definitive diagnostic and therapeutic strategy for treatment of CAH. I discussed that there is a 30% risk for development of invasive carcinoma if untreated. I discussed that there is a 25% risk for occult invasive endometrial cancer coexisting with the complex atypical hyperplasia. I discussed that there are nonsurgical options available including treatment with progesterone, however this is less definitive then surgery, causes resolution approximate 50% of patients, and would not be able to diagnose occult coexisting invasive endometrial cancer. I believe the patient is a good surgical candidate, with her major comorbidity being her obesity. I discussed with the patient that her body habitus places her at increased risk for surgical complications. We reviewed surgical risks including  bleeding, infection, damage to internal organs (such as bladder,ureters, bowels), blood clot, reoperation and rehospitalization.  She is electing for robotic hysterectomy, BSO. We will perform frozen section on the uterine specimen intraoperatively, and if malignancy is identified with high risk features, we'll perform lymphadenectomy. I discussed with the patient that there are false negatives associated with frozen section.   HPI: Gabriela Shepherd is a 59 year old P3 who is seen in consultation at the request of Dr. Sabra Heck for complex atypical endometrial hyperplasia. The patient has a history of intermittent light vaginal bleeding for more  than 3 months. She reported this her gynecologist Dr. Sabra Heck who identified a cervical polyp and the patient to the operating room on 04/05/2015 for a polypectomy of the endocervical polyp and endometrial curettings. The endocervical polyp revealed benign tissue. The in vitro curettage revealed complex endometrial hyperplasia with atypia. The patient is otherwise fairly healthy with the exception of obesity with a BMI of 39 kg/m. Her only prior abdominal surgery is a tubal ligation. She reports no history of abnormal cervical cytology.  Current Meds:  Outpatient Encounter Prescriptions as of 04/19/2015  Medication Sig  . albuterol (PROAIR HFA) 108 (90 BASE) MCG/ACT inhaler Inhale 1-2 puffs into the lungs every 6 (six) hours as needed for wheezing or shortness of breath.  . Multiple Vitamin (MULTIVITAMIN) tablet Take 1 tablet by mouth every morning.   . Naproxen Sodium (ALEVE PO) Take by mouth as needed.  . NON FORMULARY Allergy vaccines 1:10 weekly GH  . pantoprazole (PROTONIX) 40 MG tablet Take 1 tablet (40 mg total) by mouth every morning.  Loura Pardon Salicylate (ASPERCREME EX) Apply 1 application topically once as needed (knee pain.).  Marland Kitchen valsartan-hydrochlorothiazide (DIOVAN-HCT) 160-25 MG per tablet Take 1 tablet by mouth daily.  . verapamil (CALAN) 120 MG tablet Take 1 tablet (120 mg total) by mouth daily.  . [DISCONTINUED] ibuprofen (ADVIL,MOTRIN) 200 MG tablet Take 200 mg by mouth every 6 (six) hours as needed.  . clindamycin (CLEOCIN) 300 MG capsule TAKE 3 CAPSULES BY MOUTH 1 HOUR PRIOR TO DENTAL APPOINTMENT  . menthol-cetylpyridinium (CEPACOL REGULAR STRENGTH) 3 MG lozenge Take 1 lozenge by mouth as needed for sore throat.  . [DISCONTINUED] diclofenac (VOLTAREN) 75 MG EC tablet Take 1 tablet (75 mg total) by mouth 2 (  two) times daily.  . [DISCONTINUED] HYDROcodone-acetaminophen (NORCO/VICODIN) 5-325 MG per tablet Take 1-2 tablets by mouth every 6 (six) hours as needed for moderate pain or  severe pain.   No facility-administered encounter medications on file as of 04/19/2015.    Allergy:  Allergies  Allergen Reactions  . Amoxicillin     REACTION: rash, itching  . Penicillins Rash    Social Hx:   Social History   Social History  . Marital Status: Single    Spouse Name: N/A  . Number of Children: N/A  . Years of Education: N/A   Occupational History  . Not on file.   Social History Main Topics  . Smoking status: Never Smoker   . Smokeless tobacco: Never Used  . Alcohol Use: 0.0 oz/week    0 Standard drinks or equivalent per week     Comment: occ  . Drug Use: No  . Sexual Activity: No     Comment: BTL   Other Topics Concern  . Not on file   Social History Narrative    Past Surgical Hx:  Past Surgical History  Procedure Laterality Date  . Bunionectomy    . Colonoscopy  11-27-08    per Dr. Olevia Perches, hyperplastic polyps, repeat in 7 yrs   . Hysteroscopic polyp resection    . Knee arthroscopy    . Tubal ligation    . Refractive surgery      related to hole in back of left eye as stated per pt   . Total knee arthroplasty Left 05/04/2014    Procedure: LEFT TOTAL KNEE ARTHROPLASTY;  Surgeon: Mauri Pole, MD;  Location: [facility];  Service: Orthopedics;  Laterality: Left;  . Carpal tunnel release    . Dilatation & currettage/hysteroscopy with resectocope N/A 04/05/2015    Procedure: DILATATION & CURETTAGE/HYSTEROSCOPY ;  Surgeon: Megan Salon, MD;  Location: [facility];  Service: Gynecology;  Laterality: N/A;  . Cervical polypectomy N/A 04/05/2015    Procedure: CERVICAL POLYPECTOMY removal ;  Surgeon: Megan Salon, MD;  Location: [facility];  Service: Gynecology;  Laterality: N/A;    Past Medical Hx:  Past Medical History  Diagnosis Date  . Anxiety   . Asthma     sees Dr. Annamaria Boots  . GERD (gastroesophageal reflux disease)   . Hypertension   . Allergic rhinitis     sees Dr. Annamaria Boots  . Osteoarthritis   . Carpal tunnel syndrome     sees Dr. Amedeo Plenty  .  Gynecological examination     sees Dr. Elyse Hsu  . Headache(784.0)     hx of pt states BP meds has improved     Past Gynecological History:  Menopause approximately 15 years ago Patient's last menstrual period was 11/21/2009.  Family Hx:  Family History  Problem Relation Age of Onset  . Hypertension Mother   . Liver cancer Mother   . Heart disease Father   . Hypertension Father   . Diabetes Maternal Grandmother   . Cancer Maternal Aunt     Review of Systems:  Constitutional  Feels well,    ENT Normal appearing ears and nares bilaterally Skin/Breast  No rash, sores, jaundice, itching, dryness Cardiovascular  No chest pain, shortness of breath, or edema  Pulmonary  No cough or wheeze.  Gastro Intestinal  No nausea, vomitting, or diarrhoea. No bright red blood per rectum, no abdominal pain, change in bowel movement, or constipation.  Genito Urinary  No frequency, urgency, dysuria, see HPI Musculo Skeletal  No myalgia, arthralgia, joint swelling or pain  Neurologic  No weakness, numbness, change in gait,  Psychology  No depression, anxiety, insomnia.   Vitals:  Blood pressure 137/67, pulse 73, temperature 97.9 F (36.6 C), temperature source Oral, resp. rate 18, height 5\' 9"  (1.753 m), weight 262 lb 6.4 oz (119.024 kg), last menstrual period 11/21/2009, SpO2 100 %.  Physical Exam: WD in NAD Neck  Supple NROM, without any enlargements.  Lymph Node Survey No cervical supraclavicular or inguinal adenopathy Cardiovascular  Pulse normal rate, regularity and rhythm. S1 and S2 normal.  Lungs  Clear to auscultation bilateraly, without wheezes/crackles/rhonchi. Good air movement.  Skin  No rash/lesions/breakdown  Psychiatry  Alert and oriented to person, place, and time  Abdomen  Normoactive bowel sounds, abdomen soft, non-tender and obese without evidence of hernia.  Back No CVA tenderness Genito Urinary  Vulva/vagina: Normal external female genitalia.  No  lesions. No discharge or bleeding.  Bladder/urethra:  No lesions or masses, well supported bladder  Vagina: normal  Cervix: Normal appearing, no lesions.  Uterus:  Small, mobile, no parametrial involvement or nodularity.  Adnexa: no palpable masses. Rectal  Good tone, no masses no cul de sac nodularity.  Extremities  No bilateral cyanosis, clubbing or edema.   Donaciano Eva, MD  04/19/2015, 12:58 PM

## 2015-04-19 NOTE — Patient Instructions (Signed)
Preparing for your Surgery  Plan for surgery on Oct 20 with Dr. Janie Morning.  Pre-operative Testing -You will receive a phone call from presurgical testing at [facility] to arrange for a pre-operative testing appointment before your surgery.  This appointment normally occurs one to two weeks before your scheduled surgery.   -Bring your insurance card, copy of an advanced directive if applicable, medication list  -At that visit, you will be asked to sign a consent for a possible blood transfusion in case a transfusion becomes necessary during surgery.  The need for a blood transfusion is rare but having consent is a necessary part of your care.     -You should not be taking blood thinners or aspirin at least ten days prior to surgery unless instructed by your surgeon.  Day Before Surgery at [place] will be asked to take in only clear liquids the day before surgery.  Examples of clear liquids include broths, jello, and clear juices.  Avoid carbonated beverages.  You will be advised to have nothing to eat or drink after midnight the evening before.    Your role in recovery Your role is to become active as soon as directed by your doctor, while still giving yourself time to heal.  Rest when you feel tired. You will be asked to do the following in order to speed your recovery:  - Cough and breathe deeply. This helps toclear and expand your lungs and can prevent pneumonia. You may be given a spirometer to practice deep breathing. A staff member will show you how to use the spirometer. - Do mild physical activity. Walking or moving your legs help your circulation and body functions return to normal. A staff member will help you when you try to walk and will provide you with simple exercises. Do not try to get up or walk alone the first time. - Actively manage your pain. Managing your pain lets you move in comfort. We will ask you to rate your pain on a scale of zero to 10.  It is your responsibility to tell your doctor or nurse where and how much you hurt so your pain can be treated.  Special Considerations -If you are diabetic, you may be placed on insulin after surgery to have closer control over your blood sugars to promote healing and recovery.  This does not mean that you will be discharged on insulin.  If applicable, your oral antidiabetics will be resumed when you are tolerating a solid diet.  -Your final pathology results from surgery should be available by the Friday after surgery and the results will be relayed to you when available.  Blood Transfusion Information WHAT IS A BLOOD TRANSFUSION? A transfusion is the replacement of blood or some of its parts. Blood is made up of multiple cells which provide different functions.  Red blood cells carry oxygen and are used for blood loss replacement.  White blood cells fight against infection.  Platelets control bleeding.  Plasma helps clot blood.  Other blood products are available for specialized needs, such as hemophilia or other clotting disorders. BEFORE THE TRANSFUSION  Who gives blood for transfusions?   You may be able to donate blood to be used at a later date on yourself (autologous donation).  Relatives can be asked to donate blood. This is generally not any safer than if you have received blood from a stranger. The same precautions are taken to ensure safety when a relative's blood is donated.  Healthy volunteers who are fully evaluated to make sure their blood is safe. This is blood bank blood. Transfusion therapy is the safest it has ever been in the practice of medicine. Before blood is taken from a donor, a complete history is taken to make sure that person has no history of diseases nor engages in risky social behavior (examples are intravenous drug use or sexual activity with multiple partners). The donor's travel history is screened to minimize risk of transmitting infections, such as  malaria. The donated blood is tested for signs of infectious diseases, such as HIV and hepatitis. The blood is then tested to be sure it is compatible with you in order to minimize the chance of a transfusion reaction. If you or a relative donates blood, this is often done in anticipation of surgery and is not appropriate for emergency situations. It takes many days to process the donated blood. RISKS AND COMPLICATIONS Although transfusion therapy is very safe and saves many lives, the main dangers of transfusion include:   Getting an infectious disease.  Developing a transfusion reaction. This is an allergic reaction to something in the blood you were given. Every precaution is taken to prevent this. The decision to have a blood transfusion has been considered carefully by your caregiver before blood is given. Blood is not given unless the benefits outweigh the risks.

## 2015-04-23 ENCOUNTER — Ambulatory Visit (INDEPENDENT_AMBULATORY_CARE_PROVIDER_SITE_OTHER): Payer: BLUE CROSS/BLUE SHIELD

## 2015-04-23 DIAGNOSIS — J309 Allergic rhinitis, unspecified: Secondary | ICD-10-CM | POA: Diagnosis not present

## 2015-04-30 ENCOUNTER — Ambulatory Visit (INDEPENDENT_AMBULATORY_CARE_PROVIDER_SITE_OTHER): Payer: BLUE CROSS/BLUE SHIELD

## 2015-04-30 DIAGNOSIS — J309 Allergic rhinitis, unspecified: Secondary | ICD-10-CM | POA: Diagnosis not present

## 2015-05-06 NOTE — Patient Instructions (Signed)
Gabriela Shepherd  05/06/2015   Your procedure is scheduled on: Thursday 05/13/2015  Report to [facility] Main  Entrance take [place]  elevators to 3rd floor to  [place] at   [place] AM.  Call this number if you have problems the morning of surgery [phone]   Remember: ONLY 1 PERSON MAY GO WITH YOU TO SHORT STAY TO GET  READY MORNING OF [place].  Do not eat food or drink liquids :After Midnight.   REMEMBER TO FOLLOW A CLEAR LIQUID DIET ALL DAY THE DAY BEFORE SURGERY UP UNTIL MIDNIGHT! (SEE LIST BELOW)   Take these medicines the morning of surgery with A SIP OF WATER: VERAPAMIL, PROTONIX, ALBUTEROL INHALER IF NEEDED AND BRING WITH YOU TO HOSPITAL  DO NOT TAKE ANY DIABETIC MEDICATIONS DAY OF YOUR SURGERY!                               You may not have any metal on your body including hair pins and              piercings  Do not wear jewelry, make-up, lotions, powders or perfumes, deodorant             Do not wear nail polish.  Do not shave  48 hours prior to surgery.              Men may shave face and neck.   Do not bring valuables to the hospital. [place].  Contacts, dentures or bridgework may not be worn into surgery.  Leave suitcase in the car. After surgery it may be brought to your room.     Patients discharged the day of surgery will not be allowed to drive home.  Name and phone number of your driver:  Special Instructions: N/A              Please read over the following fact sheets you were given: _____________________________________________________________________             [facility] - Preparing for Surgery Before surgery, you can play an important role.  Because skin is not sterile, your skin needs to be as free of germs as possible.  You can reduce the number of germs on your skin by washing with CHG (chlorahexidine gluconate) soap before surgery.  CHG is an antiseptic  cleaner which kills germs and bonds with the skin to continue killing germs even after washing. Please DO NOT use if you have an allergy to CHG or antibacterial soaps.  If your skin becomes reddened/irritated stop using the CHG and inform your nurse when you arrive at Short Stay. Do not shave (including legs and underarms) for at least 48 hours prior to the first CHG shower.  You may shave your face/neck. Please follow these instructions carefully:  1.  Shower with CHG Soap the night before surgery and the  morning of Surgery.  2.  If you choose to wash your hair, wash your hair first as usual with your  normal  shampoo.  3.  After you shampoo, rinse your hair and body thoroughly to remove the  shampoo.  4.  Use CHG as you would any other liquid soap.  You can apply chg directly  to the skin and wash                       Gently with a scrungie or clean washcloth.  5.  Apply the CHG Soap to your body ONLY FROM THE NECK DOWN.   Do not use on face/ open                           Wound or open sores. Avoid contact with eyes, ears mouth and genitals (private parts).                       Wash face,  Genitals (private parts) with your normal soap.             6.  Wash thoroughly, paying special attention to the area where your surgery  will be performed.  7.  Thoroughly rinse your body with warm water from the neck down.  8.  DO NOT shower/wash with your normal soap after using and rinsing off  the CHG Soap.                9.  Pat yourself dry with a clean towel.            10.  Wear clean pajamas.            11.  Place clean sheets on your bed the night of your first shower and do not  sleep with pets. Day of Surgery : Do not apply any lotions/deodorants the morning of surgery.  Please wear clean clothes to the hospital/surgery center.  FAILURE TO FOLLOW THESE INSTRUCTIONS MAY RESULT IN THE CANCELLATION OF YOUR SURGERY PATIENT  SIGNATURE_________________________________  NURSE SIGNATURE__________________________________  ________________________________________________________________________   Adam Phenix  An incentive spirometer is a tool that can help keep your lungs clear and active. This tool measures how well you are filling your lungs with each breath. Taking long deep breaths may help reverse or decrease the chance of developing breathing (pulmonary) problems (especially infection) following:  A long period of time when you are unable to move or be active. BEFORE THE PROCEDURE   If the spirometer includes an indicator to show your best effort, your nurse or respiratory therapist will set it to a desired goal.  If possible, sit up straight or lean slightly forward. Try not to slouch.  Hold the incentive spirometer in an upright position. INSTRUCTIONS FOR USE   Sit on the edge of your bed if possible, or sit up as far as you can in bed or on a chair.  Hold the incentive spirometer in an upright position.  Breathe out normally.  Place the mouthpiece in your mouth and seal your lips tightly around it.  Breathe in slowly and as deeply as possible, raising the piston or the ball toward the top of the column.  Hold your breath for 3-5 seconds or for as long as possible. Allow the piston or ball to fall to the bottom of the column.  Remove the mouthpiece from your mouth and breathe out normally.  Rest for a few seconds and repeat Steps 1 through 7 at least 10 times every 1-2 hours when you are awake. Take your time and take a few normal breaths between deep breaths.  The spirometer may include an indicator to show  your best effort. Use the indicator as a goal to work toward during each repetition.  After each set of 10 deep breaths, practice coughing to be sure your lungs are clear. If you have an incision (the cut made at the time of surgery), support your incision when coughing by placing a  pillow or rolled up towels firmly against it. Once you are able to get out of bed, walk around indoors and cough well. You may stop using the incentive spirometer when instructed by your caregiver.  RISKS AND COMPLICATIONS  Take your time so you do not get dizzy or light-headed.  If you are in pain, you may need to take or ask for pain medication before doing incentive spirometry. It is harder to take a deep breath if you are having pain. AFTER USE  Rest and breathe slowly and easily.  It can be helpful to keep track of a log of your progress. Your caregiver can provide you with a simple table to help with this. If you are using the spirometer at home, follow these instructions: [place] IF:   You are having difficultly using the spirometer.  You have trouble using the spirometer as often as instructed.  Your pain medication is not giving enough relief while using the spirometer.  You develop fever of 100.5 F (38.1 C) or higher. SEEK IMMEDIATE MEDICAL CARE IF:   You cough up bloody sputum that had not been present before.  You develop fever of 102 F (38.9 C) or greater.  You develop worsening pain at or near the incision site. MAKE SURE YOU:   Understand these instructions.  Will watch your condition.  Will get help right away if you are not doing well or get worse. Document Released: 11/20/2006 Document Revised: 10/02/2011 Document Reviewed: 01/21/2007 ExitCare Patient Information 2014 ExitCare, [place].   ________________________________________________________________________  WHAT IS A BLOOD TRANSFUSION? Blood Transfusion Information  A transfusion is the replacement of blood or some of its parts. Blood is made up of multiple cells which provide different functions.  Red blood cells carry oxygen and are used for blood loss replacement.  White blood cells fight against infection.  Platelets control bleeding.  Plasma helps clot blood.  Other blood  products are available for specialized needs, such as hemophilia or other clotting disorders. BEFORE THE TRANSFUSION  Who gives blood for transfusions?   Healthy volunteers who are fully evaluated to make sure their blood is safe. This is blood bank blood. Transfusion therapy is the safest it has ever been in the practice of medicine. Before blood is taken from a donor, a complete history is taken to make sure that person has no history of diseases nor engages in risky social behavior (examples are intravenous drug use or sexual activity with multiple partners). The donor's travel history is screened to minimize risk of transmitting infections, such as malaria. The donated blood is tested for signs of infectious diseases, such as HIV and hepatitis. The blood is then tested to be sure it is compatible with you in order to minimize the chance of a transfusion reaction. If you or a relative donates blood, this is often done in anticipation of surgery and is not appropriate for emergency situations. It takes many days to process the donated blood. RISKS AND COMPLICATIONS Although transfusion therapy is very safe and saves many lives, the main dangers of transfusion include:   Getting an infectious disease.  Developing a transfusion reaction. This is an allergic reaction to  something in the blood you were given. Every precaution is taken to prevent this. The decision to have a blood transfusion has been considered carefully by your caregiver before blood is given. Blood is not given unless the benefits outweigh the risks. AFTER THE TRANSFUSION  Right after receiving a blood transfusion, you will usually feel much better and more energetic. This is especially true if your red blood cells have gotten low (anemic). The transfusion raises the level of the red blood cells which carry oxygen, and this usually causes an energy increase.  The nurse administering the transfusion will monitor you carefully for  complications. HOME CARE INSTRUCTIONS  No special instructions are needed after a transfusion. You may find your energy is better. Speak with your caregiver about any limitations on activity for underlying diseases you may have. SEEK MEDICAL CARE IF:   Your condition is not improving after your transfusion.  You develop redness or irritation at the intravenous (IV) site. SEEK IMMEDIATE MEDICAL CARE IF:  Any of the following symptoms occur over the next 12 hours:  Shaking chills.  You have a temperature by mouth above 102 F (38.9 C), not controlled by medicine.  Chest, back, or muscle pain.  People around you feel you are not acting correctly or are confused.  Shortness of breath or difficulty breathing.  Dizziness and fainting.  You get a rash or develop hives.  You have a decrease in urine output.  Your urine turns a dark color or changes to pink, red, or brown. Any of the following symptoms occur over the next 10 days:  You have a temperature by mouth above 102 F (38.9 C), not controlled by medicine.  Shortness of breath.  Weakness after normal activity.  The white part of the eye turns yellow (jaundice).  You have a decrease in the amount of urine or are urinating less often.  Your urine turns a dark color or changes to pink, red, or brown. Document Released: 07/07/2000 Document Revised: 10/02/2011 Document Reviewed: 02/24/2008 ExitCare Patient Information 2014 ExitCare, [place].  _______________________________________________________________________   CLEAR LIQUID DIET   Foods Allowed                                                                     Foods Excluded  Coffee and tea, regular and decaf                             liquids that you cannot  Plain Jell-O in any flavor                                             see through such as: Fruit ices (not with fruit pulp)                                     milk, soups, orange juice  Iced Popsicles  All solid food Carbonated beverages, regular and diet                                    Cranberry, grape and apple juices Sports drinks like Gatorade Lightly seasoned clear broth or consume(fat free) Sugar, honey syrup  Sample Menu Breakfast                                Lunch                                     Supper Cranberry juice                    Beef broth                            Chicken broth Jell-O                                     Grape juice                           Apple juice Coffee or tea                        Jell-O                                      Popsicle                                                Coffee or tea                        Coffee or tea  _____________________________________________________________________

## 2015-05-07 ENCOUNTER — Ambulatory Visit (INDEPENDENT_AMBULATORY_CARE_PROVIDER_SITE_OTHER): Payer: BLUE CROSS/BLUE SHIELD

## 2015-05-07 DIAGNOSIS — J309 Allergic rhinitis, unspecified: Secondary | ICD-10-CM

## 2015-05-09 ENCOUNTER — Other Ambulatory Visit: Payer: Self-pay | Admitting: Internal Medicine

## 2015-05-10 ENCOUNTER — Other Ambulatory Visit: Payer: Self-pay

## 2015-05-10 ENCOUNTER — Encounter (HOSPITAL_COMMUNITY): Payer: Self-pay

## 2015-05-10 ENCOUNTER — Encounter (HOSPITAL_COMMUNITY)
Admission: RE | Admit: 2015-05-10 | Discharge: 2015-05-10 | Disposition: A | Discharge status: Home / Self Care | Payer: BLUE CROSS/BLUE SHIELD | Source: Ambulatory Visit | Attending: Gynecologic Oncology | Admitting: Gynecologic Oncology

## 2015-05-10 DIAGNOSIS — Z6839 Body mass index (BMI) 39.0-39.9, adult: Secondary | ICD-10-CM | POA: Diagnosis not present

## 2015-05-10 DIAGNOSIS — M1990 Unspecified osteoarthritis, unspecified site: Secondary | ICD-10-CM | POA: Diagnosis not present

## 2015-05-10 DIAGNOSIS — I10 Essential (primary) hypertension: Secondary | ICD-10-CM | POA: Diagnosis not present

## 2015-05-10 DIAGNOSIS — N841 Polyp of cervix uteri: Secondary | ICD-10-CM | POA: Diagnosis not present

## 2015-05-10 DIAGNOSIS — Z880 Allergy status to penicillin: Secondary | ICD-10-CM | POA: Diagnosis not present

## 2015-05-10 DIAGNOSIS — Z23 Encounter for immunization: Secondary | ICD-10-CM | POA: Diagnosis not present

## 2015-05-10 DIAGNOSIS — K219 Gastro-esophageal reflux disease without esophagitis: Secondary | ICD-10-CM | POA: Diagnosis not present

## 2015-05-10 DIAGNOSIS — J45909 Unspecified asthma, uncomplicated: Secondary | ICD-10-CM | POA: Diagnosis not present

## 2015-05-10 DIAGNOSIS — F419 Anxiety disorder, unspecified: Secondary | ICD-10-CM | POA: Diagnosis not present

## 2015-05-10 DIAGNOSIS — D259 Leiomyoma of uterus, unspecified: Secondary | ICD-10-CM | POA: Diagnosis not present

## 2015-05-10 DIAGNOSIS — E669 Obesity, unspecified: Secondary | ICD-10-CM | POA: Diagnosis not present

## 2015-05-10 DIAGNOSIS — N8502 Endometrial intraepithelial neoplasia [EIN]: Secondary | ICD-10-CM | POA: Diagnosis not present

## 2015-05-10 LAB — URINALYSIS, ROUTINE W REFLEX MICROSCOPIC
Bilirubin Urine: NEGATIVE
GLUCOSE, UA: NEGATIVE mg/dL
HGB URINE DIPSTICK: NEGATIVE
Ketones, ur: NEGATIVE mg/dL
LEUKOCYTES UA: NEGATIVE
Nitrite: NEGATIVE
Protein, ur: NEGATIVE mg/dL
SPECIFIC GRAVITY, URINE: 1.015 (ref 1.005–1.030)
UROBILINOGEN UA: 0.2 mg/dL (ref 0.0–1.0)
pH: 5.5 (ref 5.0–8.0)

## 2015-05-10 LAB — CBC WITH DIFFERENTIAL/PLATELET
BASOS ABS: 0 10*3/uL (ref 0.0–0.1)
Basophils Relative: 1 %
Eosinophils Absolute: 0.1 10*3/uL (ref 0.0–0.7)
Eosinophils Relative: 1 %
HEMATOCRIT: 42.3 % (ref 36.0–46.0)
HEMOGLOBIN: 13.8 g/dL (ref 12.0–15.0)
LYMPHS PCT: 26 %
Lymphs Abs: 1.6 10*3/uL (ref 0.7–4.0)
MCH: 28.8 pg (ref 26.0–34.0)
MCHC: 32.6 g/dL (ref 30.0–36.0)
MCV: 88.3 fL (ref 78.0–100.0)
Monocytes Absolute: 0.4 10*3/uL (ref 0.1–1.0)
Monocytes Relative: 7 %
NEUTROS ABS: 4 10*3/uL (ref 1.7–7.7)
Neutrophils Relative %: 65 %
Platelets: 238 10*3/uL (ref 150–400)
RBC: 4.79 MIL/uL (ref 3.87–5.11)
RDW: 13.2 % (ref 11.5–15.5)
WBC: 6.2 10*3/uL (ref 4.0–10.5)

## 2015-05-10 LAB — COMPREHENSIVE METABOLIC PANEL
ALT: 24 U/L (ref 14–54)
AST: 25 U/L (ref 15–41)
Albumin: 4.3 g/dL (ref 3.5–5.0)
Alkaline Phosphatase: 75 U/L (ref 38–126)
Anion gap: 7 (ref 5–15)
BILIRUBIN TOTAL: 0.5 mg/dL (ref 0.3–1.2)
BUN: 18 mg/dL (ref 6–20)
CO2: 29 mmol/L (ref 22–32)
CREATININE: 0.78 mg/dL (ref 0.44–1.00)
Calcium: 9.4 mg/dL (ref 8.9–10.3)
Chloride: 105 mmol/L (ref 101–111)
GFR calc Af Amer: >60 mL/min (ref >60)
Glucose, Bld: 118 mg/dL — ABNORMAL HIGH (ref 65–99)
Potassium: 3.7 mmol/L (ref 3.5–5.1)
Sodium: 141 mmol/L (ref 135–145)
TOTAL PROTEIN: 7.7 g/dL (ref 6.5–8.1)

## 2015-05-12 ENCOUNTER — Other Ambulatory Visit: Payer: Self-pay | Admitting: Internal Medicine

## 2015-05-13 ENCOUNTER — Encounter (HOSPITAL_COMMUNITY)
Admission: RE | Disposition: A | Discharge status: Home / Self Care | Payer: Self-pay | Source: Ambulatory Visit | Attending: Obstetrics & Gynecology

## 2015-05-13 ENCOUNTER — Ambulatory Visit (HOSPITAL_COMMUNITY)
Admission: RE | Admit: 2015-05-13 | Discharge: 2015-05-14 | Disposition: A | Discharge status: Home / Self Care | Payer: BLUE CROSS/BLUE SHIELD | Source: Ambulatory Visit | Attending: Obstetrics & Gynecology | Admitting: Obstetrics & Gynecology

## 2015-05-13 ENCOUNTER — Ambulatory Visit (HOSPITAL_COMMUNITY): Payer: BLUE CROSS/BLUE SHIELD | Admitting: Certified Registered Nurse Anesthetist

## 2015-05-13 ENCOUNTER — Encounter (HOSPITAL_COMMUNITY): Payer: Self-pay | Admitting: *Deleted

## 2015-05-13 DIAGNOSIS — N8502 Endometrial intraepithelial neoplasia [EIN]: Secondary | ICD-10-CM | POA: Diagnosis not present

## 2015-05-13 DIAGNOSIS — Z880 Allergy status to penicillin: Secondary | ICD-10-CM | POA: Insufficient documentation

## 2015-05-13 DIAGNOSIS — N8500 Endometrial hyperplasia, unspecified: Secondary | ICD-10-CM | POA: Diagnosis present

## 2015-05-13 DIAGNOSIS — M1990 Unspecified osteoarthritis, unspecified site: Secondary | ICD-10-CM | POA: Insufficient documentation

## 2015-05-13 DIAGNOSIS — K219 Gastro-esophageal reflux disease without esophagitis: Secondary | ICD-10-CM | POA: Insufficient documentation

## 2015-05-13 DIAGNOSIS — F419 Anxiety disorder, unspecified: Secondary | ICD-10-CM | POA: Insufficient documentation

## 2015-05-13 DIAGNOSIS — J45909 Unspecified asthma, uncomplicated: Secondary | ICD-10-CM | POA: Insufficient documentation

## 2015-05-13 DIAGNOSIS — N841 Polyp of cervix uteri: Secondary | ICD-10-CM | POA: Insufficient documentation

## 2015-05-13 DIAGNOSIS — D259 Leiomyoma of uterus, unspecified: Secondary | ICD-10-CM | POA: Insufficient documentation

## 2015-05-13 DIAGNOSIS — Z23 Encounter for immunization: Secondary | ICD-10-CM | POA: Insufficient documentation

## 2015-05-13 DIAGNOSIS — Z6839 Body mass index (BMI) 39.0-39.9, adult: Secondary | ICD-10-CM | POA: Insufficient documentation

## 2015-05-13 DIAGNOSIS — E669 Obesity, unspecified: Secondary | ICD-10-CM | POA: Insufficient documentation

## 2015-05-13 DIAGNOSIS — I10 Essential (primary) hypertension: Secondary | ICD-10-CM | POA: Insufficient documentation

## 2015-05-13 HISTORY — PX: ROBOTIC ASSISTED TOTAL HYSTERECTOMY WITH BILATERAL SALPINGO OOPHERECTOMY: SHX6086

## 2015-05-13 LAB — CBC
HEMATOCRIT: 39.4 % (ref 36.0–46.0)
Hemoglobin: 13.1 g/dL (ref 12.0–15.0)
MCH: 29.1 pg (ref 26.0–34.0)
MCHC: 33.2 g/dL (ref 30.0–36.0)
MCV: 87.6 fL (ref 78.0–100.0)
PLATELETS: 221 10*3/uL (ref 150–400)
RBC: 4.5 MIL/uL (ref 3.87–5.11)
RDW: 13.3 % (ref 11.5–15.5)
WBC: 16.9 10*3/uL — ABNORMAL HIGH (ref 4.0–10.5)

## 2015-05-13 LAB — TYPE AND SCREEN
ABO/RH(D): B POS
Antibody Screen: NEGATIVE

## 2015-05-13 SURGERY — HYSTERECTOMY, TOTAL, ROBOT-ASSISTED, LAPAROSCOPIC, WITH BILATERAL SALPINGO-OOPHORECTOMY
Anesthesia: General

## 2015-05-13 MED ORDER — KCL IN DEXTROSE-NACL 20-5-0.45 MEQ/L-%-% IV SOLN
INTRAVENOUS | Status: DC
  Administered 2015-05-13: 18:00:00 via INTRAVENOUS
  Filled 2015-05-13 (×2): qty 1000

## 2015-05-13 MED ORDER — CIPROFLOXACIN IN D5W 400 MG/200ML IV SOLN
INTRAVENOUS | Status: AC
  Filled 2015-05-13: qty 200

## 2015-05-13 MED ORDER — RINGERS IRRIGATION IR SOLN
Status: DC | PRN
  Administered 2015-05-13: 1

## 2015-05-13 MED ORDER — ACETAMINOPHEN 500 MG PO TABS
1000.0000 mg | ORAL_TABLET | Freq: Four times a day (QID) | ORAL | Status: DC
  Administered 2015-05-13 – 2015-05-14 (×3): 1000 mg via ORAL
  Filled 2015-05-13 (×6): qty 2

## 2015-05-13 MED ORDER — LIDOCAINE-EPINEPHRINE (PF) 1 %-1:200000 IJ SOLN
INTRAMUSCULAR | Status: DC | PRN
  Administered 2015-05-13: 20 mL

## 2015-05-13 MED ORDER — DEXAMETHASONE SODIUM PHOSPHATE 10 MG/ML IJ SOLN
INTRAMUSCULAR | Status: AC
  Filled 2015-05-13: qty 1

## 2015-05-13 MED ORDER — SUGAMMADEX SODIUM 500 MG/5ML IV SOLN
INTRAVENOUS | Status: AC
  Filled 2015-05-13: qty 5

## 2015-05-13 MED ORDER — ONDANSETRON HCL 4 MG/2ML IJ SOLN
4.0000 mg | Freq: Four times a day (QID) | INTRAMUSCULAR | Status: DC | PRN
  Administered 2015-05-13: 4 mg via INTRAVENOUS
  Filled 2015-05-13: qty 2

## 2015-05-13 MED ORDER — LIDOCAINE-EPINEPHRINE 1 %-1:100000 IJ SOLN
INTRAMUSCULAR | Status: AC
  Filled 2015-05-13: qty 1

## 2015-05-13 MED ORDER — ONDANSETRON HCL 4 MG/2ML IJ SOLN
INTRAMUSCULAR | Status: DC | PRN
  Administered 2015-05-13: 4 mg via INTRAVENOUS

## 2015-05-13 MED ORDER — VERAPAMIL HCL 120 MG PO TABS
120.0000 mg | ORAL_TABLET | Freq: Every day | ORAL | Status: DC
  Administered 2015-05-14: 120 mg via ORAL
  Filled 2015-05-13: qty 1

## 2015-05-13 MED ORDER — HYDROMORPHONE HCL 1 MG/ML IJ SOLN
0.2500 mg | INTRAMUSCULAR | Status: DC | PRN
  Administered 2015-05-13 (×2): 0.5 mg via INTRAVENOUS

## 2015-05-13 MED ORDER — LIDOCAINE HCL (CARDIAC) 20 MG/ML IV SOLN
INTRAVENOUS | Status: DC | PRN
  Administered 2015-05-13: 100 mg via INTRAVENOUS

## 2015-05-13 MED ORDER — PROPOFOL 10 MG/ML IV BOLUS
INTRAVENOUS | Status: AC
  Filled 2015-05-13: qty 20

## 2015-05-13 MED ORDER — CIPROFLOXACIN IN D5W 400 MG/200ML IV SOLN
400.0000 mg | INTRAVENOUS | Status: AC
  Administered 2015-05-13: 400 mg via INTRAVENOUS

## 2015-05-13 MED ORDER — MIDAZOLAM HCL 2 MG/2ML IJ SOLN
INTRAMUSCULAR | Status: AC
  Filled 2015-05-13: qty 4

## 2015-05-13 MED ORDER — ROCURONIUM BROMIDE 100 MG/10ML IV SOLN
INTRAVENOUS | Status: DC | PRN
  Administered 2015-05-13: 50 mg via INTRAVENOUS
  Administered 2015-05-13 (×4): 10 mg via INTRAVENOUS

## 2015-05-13 MED ORDER — LABETALOL HCL 5 MG/ML IV SOLN
INTRAVENOUS | Status: AC
  Filled 2015-05-13: qty 4

## 2015-05-13 MED ORDER — PANTOPRAZOLE SODIUM 40 MG PO TBEC
40.0000 mg | DELAYED_RELEASE_TABLET | Freq: Every day | ORAL | Status: DC
  Administered 2015-05-14: 40 mg via ORAL
  Filled 2015-05-13: qty 1

## 2015-05-13 MED ORDER — DICLOFENAC SODIUM 50 MG PO TBEC
50.0000 mg | DELAYED_RELEASE_TABLET | Freq: Four times a day (QID) | ORAL | Status: DC
  Administered 2015-05-14 (×2): 50 mg via ORAL
  Filled 2015-05-13 (×6): qty 1

## 2015-05-13 MED ORDER — MIDAZOLAM HCL 5 MG/5ML IJ SOLN
INTRAMUSCULAR | Status: DC | PRN
  Administered 2015-05-13: 2 mg via INTRAVENOUS

## 2015-05-13 MED ORDER — SUCCINYLCHOLINE CHLORIDE 20 MG/ML IJ SOLN
INTRAMUSCULAR | Status: DC | PRN
  Administered 2015-05-13: 100 mg via INTRAVENOUS

## 2015-05-13 MED ORDER — VALSARTAN-HYDROCHLOROTHIAZIDE 160-25 MG PO TABS: 1.0000 | ORAL_TABLET | Freq: Every day | ORAL | Status: DC

## 2015-05-13 MED ORDER — HYDROMORPHONE HCL 2 MG/ML IJ SOLN
INTRAMUSCULAR | Status: AC
  Filled 2015-05-13: qty 1

## 2015-05-13 MED ORDER — ONDANSETRON HCL 4 MG PO TABS
4.0000 mg | ORAL_TABLET | Freq: Four times a day (QID) | ORAL | Status: DC | PRN
Start: 2015-05-13 — End: 2015-05-14

## 2015-05-13 MED ORDER — OXYCODONE HCL 5 MG PO TABS: 5.0000 mg | ORAL_TABLET | Freq: Once | ORAL | Status: DC | PRN

## 2015-05-13 MED ORDER — LIDOCAINE HCL (CARDIAC) 20 MG/ML IV SOLN
INTRAVENOUS | Status: AC
  Filled 2015-05-13: qty 5

## 2015-05-13 MED ORDER — HYDROMORPHONE HCL 1 MG/ML IJ SOLN
0.2000 mg | INTRAMUSCULAR | Status: AC | PRN
  Administered 2015-05-13 (×2): 0.5 mg via INTRAVENOUS
  Filled 2015-05-13 (×3): qty 1

## 2015-05-13 MED ORDER — INFLUENZA VAC SPLIT QUAD 0.5 ML IM SUSY
0.5000 mL | PREFILLED_SYRINGE | INTRAMUSCULAR | Status: AC
  Administered 2015-05-14: 0.5 mL via INTRAMUSCULAR
  Filled 2015-05-13 (×2): qty 0.5

## 2015-05-13 MED ORDER — HEPARIN SODIUM (PORCINE) 5000 UNIT/ML IJ SOLN
5000.0000 [IU] | INTRAMUSCULAR | Status: AC
  Administered 2015-05-13: 5000 [IU] via SUBCUTANEOUS
  Filled 2015-05-13: qty 1

## 2015-05-13 MED ORDER — CLINDAMYCIN PHOSPHATE 900 MG/50ML IV SOLN
900.0000 mg | INTRAVENOUS | Status: AC
  Administered 2015-05-13: 900 mg via INTRAVENOUS

## 2015-05-13 MED ORDER — ALBUTEROL SULFATE (2.5 MG/3ML) 0.083% IN NEBU: 3.0000 mL | INHALATION_SOLUTION | Freq: Four times a day (QID) | RESPIRATORY_TRACT | Status: DC | PRN

## 2015-05-13 MED ORDER — FENTANYL CITRATE (PF) 250 MCG/5ML IJ SOLN
INTRAMUSCULAR | Status: AC
  Filled 2015-05-13: qty 25

## 2015-05-13 MED ORDER — ENOXAPARIN SODIUM 40 MG/0.4ML ~~LOC~~ SOLN
40.0000 mg | SUBCUTANEOUS | Status: DC
  Administered 2015-05-14: 40 mg via SUBCUTANEOUS
  Filled 2015-05-13 (×2): qty 0.4

## 2015-05-13 MED ORDER — ROCURONIUM BROMIDE 100 MG/10ML IV SOLN
INTRAVENOUS | Status: AC
  Filled 2015-05-13: qty 1

## 2015-05-13 MED ORDER — ONDANSETRON HCL 4 MG/2ML IJ SOLN: 4.0000 mg | Freq: Four times a day (QID) | INTRAMUSCULAR | Status: DC | PRN

## 2015-05-13 MED ORDER — PROPOFOL 10 MG/ML IV BOLUS
INTRAVENOUS | Status: DC | PRN
  Administered 2015-05-13: 200 mg via INTRAVENOUS

## 2015-05-13 MED ORDER — ONDANSETRON HCL 4 MG/2ML IJ SOLN
INTRAMUSCULAR | Status: AC
Start: 2015-05-13 — End: 2015-05-13
  Filled 2015-05-13: qty 2

## 2015-05-13 MED ORDER — SUGAMMADEX SODIUM 200 MG/2ML IV SOLN
INTRAVENOUS | Status: DC | PRN
  Administered 2015-05-13: 250 mg via INTRAVENOUS

## 2015-05-13 MED ORDER — LABETALOL HCL 5 MG/ML IV SOLN
INTRAVENOUS | Status: DC | PRN
  Administered 2015-05-13: 2.5 mg via INTRAVENOUS

## 2015-05-13 MED ORDER — OXYCODONE HCL 5 MG PO TABS
10.0000 mg | ORAL_TABLET | ORAL | Status: DC | PRN
  Administered 2015-05-14 (×2): 10 mg via ORAL
  Administered 2015-05-14: 5 mg via ORAL
  Filled 2015-05-13 (×3): qty 2

## 2015-05-13 MED ORDER — OXYCODONE HCL 5 MG/5ML PO SOLN
5.0000 mg | Freq: Once | ORAL | Status: DC | PRN
  Filled 2015-05-13: qty 5

## 2015-05-13 MED ORDER — CLINDAMYCIN PHOSPHATE 900 MG/50ML IV SOLN
INTRAVENOUS | Status: AC
  Filled 2015-05-13: qty 50

## 2015-05-13 MED ORDER — HYDROMORPHONE HCL 1 MG/ML IJ SOLN
INTRAMUSCULAR | Status: AC
  Filled 2015-05-13: qty 1

## 2015-05-13 MED ORDER — HYDROMORPHONE HCL 1 MG/ML IJ SOLN
INTRAMUSCULAR | Status: DC | PRN
  Administered 2015-05-13: 0.5 mg via INTRAVENOUS
  Administered 2015-05-13: 1 mg via INTRAVENOUS
  Administered 2015-05-13: 0.5 mg via INTRAVENOUS

## 2015-05-13 MED ORDER — FENTANYL CITRATE (PF) 100 MCG/2ML IJ SOLN
INTRAMUSCULAR | Status: DC | PRN
  Administered 2015-05-13 (×3): 50 ug via INTRAVENOUS
  Administered 2015-05-13: 100 ug via INTRAVENOUS
  Administered 2015-05-13: 50 ug via INTRAVENOUS

## 2015-05-13 MED ORDER — DEXAMETHASONE SODIUM PHOSPHATE 10 MG/ML IJ SOLN
INTRAMUSCULAR | Status: DC | PRN
  Administered 2015-05-13: 10 mg via INTRAVENOUS

## 2015-05-13 MED ORDER — 0.9 % SODIUM CHLORIDE (POUR BTL) OPTIME
TOPICAL | Status: DC | PRN
  Administered 2015-05-13: 1000 mL

## 2015-05-13 MED ORDER — LACTATED RINGERS IV SOLN
INTRAVENOUS | Status: DC | PRN
  Administered 2015-05-13 (×3): via INTRAVENOUS

## 2015-05-13 MED ORDER — HYDROCHLOROTHIAZIDE 25 MG PO TABS
25.0000 mg | ORAL_TABLET | Freq: Every day | ORAL | Status: DC
  Administered 2015-05-14: 25 mg via ORAL
  Filled 2015-05-13 (×2): qty 1

## 2015-05-13 MED ORDER — FENTANYL CITRATE (PF) 100 MCG/2ML IJ SOLN
INTRAMUSCULAR | Status: AC
  Filled 2015-05-13: qty 4

## 2015-05-13 MED ORDER — IRBESARTAN 150 MG PO TABS
150.0000 mg | ORAL_TABLET | Freq: Every day | ORAL | Status: DC
  Administered 2015-05-14: 150 mg via ORAL
  Filled 2015-05-13 (×2): qty 1

## 2015-05-13 SURGICAL SUPPLY — 60 items
BENZOIN TINCTURE PRP APPL 2/3 (GAUZE/BANDAGES/DRESSINGS) ×3 IMPLANT
CABLE HIGH FREQUENCY MONO STRZ (ELECTRODE) ×3 IMPLANT
CHLORAPREP W/TINT 26ML (MISCELLANEOUS) ×3 IMPLANT
CORDS BIPOLAR (ELECTRODE) ×3 IMPLANT
COVER SURGICAL LIGHT HANDLE (MISCELLANEOUS) ×3 IMPLANT
COVER TIP SHEARS 8 DVNC (MISCELLANEOUS) ×2 IMPLANT
COVER TIP SHEARS 8MM DA VINCI (MISCELLANEOUS) ×1
DERMABOND ADVANCED (GAUZE/BANDAGES/DRESSINGS) ×1
DERMABOND ADVANCED .7 DNX12 (GAUZE/BANDAGES/DRESSINGS) ×2 IMPLANT
DRAPE ARM DVNC X/XI (DISPOSABLE) ×8 IMPLANT
DRAPE COLUMN DVNC XI (DISPOSABLE) ×2 IMPLANT
DRAPE DA VINCI XI ARM (DISPOSABLE) ×4
DRAPE DA VINCI XI COLUMN (DISPOSABLE) ×1
DRAPE SHEET LG 3/4 BI-LAMINATE (DRAPES) ×6 IMPLANT
DRAPE SURG IRRIG POUCH 19X23 (DRAPES) ×3 IMPLANT
DRAPE TABLE BACK 44X90 PK DISP (DRAPES) ×6 IMPLANT
DRAPE WARM FLUID 44X44 (DRAPE) ×3 IMPLANT
DRSG TEGADERM 2-3/8X2-3/4 SM (GAUZE/BANDAGES/DRESSINGS) ×9 IMPLANT
DRSG TEGADERM 4X4.75 (GAUZE/BANDAGES/DRESSINGS) ×3 IMPLANT
DRSG TEGADERM 6X8 (GAUZE/BANDAGES/DRESSINGS) ×6 IMPLANT
ELECT REM PT RETURN 9FT ADLT (ELECTROSURGICAL) ×3
ELECTRODE REM PT RTRN 9FT ADLT (ELECTROSURGICAL) ×2 IMPLANT
GAUZE SPONGE 2X2 8PLY STRL LF (GAUZE/BANDAGES/DRESSINGS) ×4 IMPLANT
GLOVE BIO SURGEON STRL SZ 6.5 (GLOVE) ×6 IMPLANT
GLOVE BIO SURGEON STRL SZ7.5 (GLOVE) ×9 IMPLANT
GLOVE INDICATOR 8.0 STRL GRN (GLOVE) ×6 IMPLANT
GOWN STRL NON-REIN LRG LVL3 (GOWN DISPOSABLE) ×3 IMPLANT
GOWN STRL REUS W/TWL XL LVL3 (GOWN DISPOSABLE) ×6 IMPLANT
HOLDER FOLEY CATH W/STRAP (MISCELLANEOUS) ×3 IMPLANT
KIT BASIN OR (CUSTOM PROCEDURE TRAY) ×3 IMPLANT
MANIPULATOR UTERINE 4.5 ZUMI (MISCELLANEOUS) IMPLANT
OCCLUDER COLPOPNEUMO (BALLOONS) ×3 IMPLANT
PEN SKIN MARKING BROAD (MISCELLANEOUS) ×3 IMPLANT
POUCH SPECIMEN RETRIEVAL 10MM (ENDOMECHANICALS) ×3 IMPLANT
RETRACT II ENDO 10MM 32CML (ENDOMECHANICALS) ×3
RETRACTOR II ENDO 10MM 32CML (ENDOMECHANICALS) ×2 IMPLANT
SET TUBE IRRIG SUCTION NO TIP (IRRIGATION / IRRIGATOR) ×3 IMPLANT
SHEET LAVH (DRAPES) ×3 IMPLANT
SOLUTION ELECTROLUBE (MISCELLANEOUS) ×3 IMPLANT
SPONGE GAUZE 2X2 STER 10/PKG (GAUZE/BANDAGES/DRESSINGS) ×2
SPONGE LAP 18X18 X RAY DECT (DISPOSABLE) IMPLANT
STRIP CLOSURE SKIN 1/2X4 (GAUZE/BANDAGES/DRESSINGS) ×3 IMPLANT
SUT VIC AB 0 CT1 27 (SUTURE) ×1
SUT VIC AB 0 CT1 27XBRD ANTBC (SUTURE) ×2 IMPLANT
SUT VIC AB 4-0 PS2 27 (SUTURE) ×6 IMPLANT
SUT VICRYL 0 UR6 27IN ABS (SUTURE) IMPLANT
SUT VLOC 180 0 9IN  GS21 (SUTURE)
SUT VLOC 180 0 9IN GS21 (SUTURE) IMPLANT
SYR 50ML LL SCALE MARK (SYRINGE) ×3 IMPLANT
SYR BULB IRRIGATION 50ML (SYRINGE) IMPLANT
TOWEL OR 17X26 10 PK STRL BLUE (TOWEL DISPOSABLE) ×6 IMPLANT
TOWEL OR NON WOVEN STRL DISP B (DISPOSABLE) ×3 IMPLANT
TRAP SPECIMEN MUCOUS 40CC (MISCELLANEOUS) IMPLANT
TRAY FOLEY W/METER SILVER 14FR (SET/KITS/TRAYS/PACK) ×3 IMPLANT
TRAY FOLEY W/METER SILVER 16FR (SET/KITS/TRAYS/PACK) ×3 IMPLANT
TRAY LAPAROSCOPIC (CUSTOM PROCEDURE TRAY) ×3 IMPLANT
TROCAR 12M 150ML BLUNT (TROCAR) ×3 IMPLANT
TROCAR BLADELESS OPT 5 75 (ENDOMECHANICALS) ×6 IMPLANT
TROCAR XCEL 12X100 BLDLESS (ENDOMECHANICALS) ×6 IMPLANT
WATER STERILE IRR 1500ML POUR (IV SOLUTION) ×6 IMPLANT

## 2015-05-13 NOTE — Interval H&P Note (Signed)
History and Physical Interval Note:  05/13/2015 9:42 AM  Gabriela Shepherd  has presented today for surgery, with the diagnosis of COMPLEX ENDOMETRIAL HYPERPLASIA WITH ATYPIA   The various methods of treatment have been discussed with the patient and family. After consideration of risks, benefits and other options for treatment, the patient has consented to  Procedure(s): XI ROBOTIC ASSISTED TOTAL HYSTERECTOMY WITH BILATERAL SALPINGO OOPHORECTOMY (Bilateral) WITH POSSIBLE LYMPHADENECTOMY  (N/A) as a surgical intervention .  The patient's history has been reviewed, patient examined, no change in status, stable for surgery.  I have reviewed the patient's chart and labs.  Questions were answered to the patient's satisfaction.     [place], [facility]

## 2015-05-13 NOTE — Anesthesia Preprocedure Evaluation (Signed)
Anesthesia Evaluation  Patient identified by MRN, date of birth, ID band Patient awake    Reviewed: Allergy & Precautions, NPO status , Patient's Chart, lab work & pertinent test results  Airway Mallampati: II   Neck ROM: full    Dental   Pulmonary asthma ,    breath sounds clear to auscultation       Cardiovascular hypertension,  Rhythm:regular Rate:Normal     Neuro/Psych  Headaches, Anxiety  Neuromuscular disease    GI/Hepatic GERD  ,  Endo/Other  obese  Renal/GU      Musculoskeletal  (+) Arthritis ,   Abdominal   Peds  Hematology   Anesthesia Other Findings   Reproductive/Obstetrics                             Anesthesia Physical Anesthesia Plan  ASA: II  Anesthesia Plan: General   Post-op Pain Management:    Induction: Intravenous  Airway Management Planned: Oral ETT  Additional Equipment:   Intra-op Plan:   Post-operative Plan: Extubation in OR  Informed Consent: I have reviewed the patients History and Physical, chart, labs and discussed the procedure including the risks, benefits and alternatives for the proposed anesthesia with the patient or authorized representative who has indicated his/her understanding and acceptance.     Plan Discussed with: CRNA, Anesthesiologist and Surgeon  Anesthesia Plan Comments:         Anesthesia Quick Evaluation

## 2015-05-13 NOTE — H&P (View-Only) (Signed)
Consult Note: Gyn-Onc  Consult was requested by Dr. Sabra Heck for the evaluation of MCKENZYE CUTRIGHT 59 y.o. female with CAH  CC:  Chief Complaint  Patient presents with  . complex endometrial hyperplasia with atypia    New consult    Assessment/Plan:  Ms. HAYAT WARBINGTON  is a 59 y.o.  year old with complex endometrial hyperplasia and a benign cervical polyp (excised). She has obesity (BMI 39kg/m2).   I discussed with the patient that we recommend hysterectomy with BSO as definitive diagnostic and therapeutic strategy for treatment of CAH. I discussed that there is a 30% risk for development of invasive carcinoma if untreated. I discussed that there is a 25% risk for occult invasive endometrial cancer coexisting with the complex atypical hyperplasia. I discussed that there are nonsurgical options available including treatment with progesterone, however this is less definitive then surgery, causes resolution approximate 50% of patients, and would not be able to diagnose occult coexisting invasive endometrial cancer. I believe the patient is a good surgical candidate, with her major comorbidity being her obesity. I discussed with the patient that her body habitus places her at increased risk for surgical complications. We reviewed surgical risks including  bleeding, infection, damage to internal organs (such as bladder,ureters, bowels), blood clot, reoperation and rehospitalization.  She is electing for robotic hysterectomy, BSO. We will perform frozen section on the uterine specimen intraoperatively, and if malignancy is identified with high risk features, we'll perform lymphadenectomy. I discussed with the patient that there are false negatives associated with frozen section.   HPI: Augusta Mirkin is a 59 year old P3 who is seen in consultation at the request of Dr. Sabra Heck for complex atypical endometrial hyperplasia. The patient has a history of intermittent light vaginal bleeding for more  than 3 months. She reported this her gynecologist Dr. Sabra Heck who identified a cervical polyp and the patient to the operating room on 04/05/2015 for a polypectomy of the endocervical polyp and endometrial curettings. The endocervical polyp revealed benign tissue. The in vitro curettage revealed complex endometrial hyperplasia with atypia. The patient is otherwise fairly healthy with the exception of obesity with a BMI of 39 kg/m. Her only prior abdominal surgery is a tubal ligation. She reports no history of abnormal cervical cytology.  Current Meds:  Outpatient Encounter Prescriptions as of 04/19/2015  Medication Sig  . albuterol (PROAIR HFA) 108 (90 BASE) MCG/ACT inhaler Inhale 1-2 puffs into the lungs every 6 (six) hours as needed for wheezing or shortness of breath.  . Multiple Vitamin (MULTIVITAMIN) tablet Take 1 tablet by mouth every morning.   . Naproxen Sodium (ALEVE PO) Take by mouth as needed.  . NON FORMULARY Allergy vaccines 1:10 weekly GH  . pantoprazole (PROTONIX) 40 MG tablet Take 1 tablet (40 mg total) by mouth every morning.  Loura Pardon Salicylate (ASPERCREME EX) Apply 1 application topically once as needed (knee pain.).  Marland Kitchen valsartan-hydrochlorothiazide (DIOVAN-HCT) 160-25 MG per tablet Take 1 tablet by mouth daily.  . verapamil (CALAN) 120 MG tablet Take 1 tablet (120 mg total) by mouth daily.  . [DISCONTINUED] ibuprofen (ADVIL,MOTRIN) 200 MG tablet Take 200 mg by mouth every 6 (six) hours as needed.  . clindamycin (CLEOCIN) 300 MG capsule TAKE 3 CAPSULES BY MOUTH 1 HOUR PRIOR TO DENTAL APPOINTMENT  . menthol-cetylpyridinium (CEPACOL REGULAR STRENGTH) 3 MG lozenge Take 1 lozenge by mouth as needed for sore throat.  . [DISCONTINUED] diclofenac (VOLTAREN) 75 MG EC tablet Take 1 tablet (75 mg total) by mouth 2 (  two) times daily.  . [DISCONTINUED] HYDROcodone-acetaminophen (NORCO/VICODIN) 5-325 MG per tablet Take 1-2 tablets by mouth every 6 (six) hours as needed for moderate pain or  severe pain.   No facility-administered encounter medications on file as of 04/19/2015.    Allergy:  Allergies  Allergen Reactions  . Amoxicillin     REACTION: rash, itching  . Penicillins Rash    Social Hx:   Social History   Social History  . Marital Status: Single    Spouse Name: N/A  . Number of Children: N/A  . Years of Education: N/A   Occupational History  . Not on file.   Social History Main Topics  . Smoking status: Never Smoker   . Smokeless tobacco: Never Used  . Alcohol Use: 0.0 oz/week    0 Standard drinks or equivalent per week     Comment: occ  . Drug Use: No  . Sexual Activity: No     Comment: BTL   Other Topics Concern  . Not on file   Social History Narrative    Past Surgical Hx:  Past Surgical History  Procedure Laterality Date  . Bunionectomy    . Colonoscopy  11-27-08    per Dr. Olevia Perches, hyperplastic polyps, repeat in 7 yrs   . Hysteroscopic polyp resection    . Knee arthroscopy    . Tubal ligation    . Refractive surgery      related to hole in back of left eye as stated per pt   . Total knee arthroplasty Left 05/04/2014    Procedure: LEFT TOTAL KNEE ARTHROPLASTY;  Surgeon: Mauri Pole, MD;  Location: [facility];  Service: Orthopedics;  Laterality: Left;  . Carpal tunnel release    . Dilatation & currettage/hysteroscopy with resectocope N/A 04/05/2015    Procedure: DILATATION & CURETTAGE/HYSTEROSCOPY ;  Surgeon: Megan Salon, MD;  Location: [facility];  Service: Gynecology;  Laterality: N/A;  . Cervical polypectomy N/A 04/05/2015    Procedure: CERVICAL POLYPECTOMY removal ;  Surgeon: Megan Salon, MD;  Location: [facility];  Service: Gynecology;  Laterality: N/A;    Past Medical Hx:  Past Medical History  Diagnosis Date  . Anxiety   . Asthma     sees Dr. Annamaria Boots  . GERD (gastroesophageal reflux disease)   . Hypertension   . Allergic rhinitis     sees Dr. Annamaria Boots  . Osteoarthritis   . Carpal tunnel syndrome     sees Dr. Amedeo Plenty  .  Gynecological examination     sees Dr. Elyse Hsu  . Headache(784.0)     hx of pt states BP meds has improved     Past Gynecological History:  Menopause approximately 15 years ago Patient's last menstrual period was 11/21/2009.  Family Hx:  Family History  Problem Relation Age of Onset  . Hypertension Mother   . Liver cancer Mother   . Heart disease Father   . Hypertension Father   . Diabetes Maternal Grandmother   . Cancer Maternal Aunt     Review of Systems:  Constitutional  Feels well,    ENT Normal appearing ears and nares bilaterally Skin/Breast  No rash, sores, jaundice, itching, dryness Cardiovascular  No chest pain, shortness of breath, or edema  Pulmonary  No cough or wheeze.  Gastro Intestinal  No nausea, vomitting, or diarrhoea. No bright red blood per rectum, no abdominal pain, change in bowel movement, or constipation.  Genito Urinary  No frequency, urgency, dysuria, see HPI Musculo Skeletal  No myalgia, arthralgia, joint swelling or pain  Neurologic  No weakness, numbness, change in gait,  Psychology  No depression, anxiety, insomnia.   Vitals:  Blood pressure 137/67, pulse 73, temperature 97.9 F (36.6 C), temperature source Oral, resp. rate 18, height 5\' 9"  (1.753 m), weight 262 lb 6.4 oz (119.024 kg), last menstrual period 11/21/2009, SpO2 100 %.  Physical Exam: WD in NAD Neck  Supple NROM, without any enlargements.  Lymph Node Survey No cervical supraclavicular or inguinal adenopathy Cardiovascular  Pulse normal rate, regularity and rhythm. S1 and S2 normal.  Lungs  Clear to auscultation bilateraly, without wheezes/crackles/rhonchi. Good air movement.  Skin  No rash/lesions/breakdown  Psychiatry  Alert and oriented to person, place, and time  Abdomen  Normoactive bowel sounds, abdomen soft, non-tender and obese without evidence of hernia.  Back No CVA tenderness Genito Urinary  Vulva/vagina: Normal external female genitalia.  No  lesions. No discharge or bleeding.  Bladder/urethra:  No lesions or masses, well supported bladder  Vagina: normal  Cervix: Normal appearing, no lesions.  Uterus:  Small, mobile, no parametrial involvement or nodularity.  Adnexa: no palpable masses. Rectal  Good tone, no masses no cul de sac nodularity.  Extremities  No bilateral cyanosis, clubbing or edema.   Donaciano Eva, MD  04/19/2015, 12:58 PM

## 2015-05-13 NOTE — Transfer of Care (Signed)
Immediate Anesthesia Transfer of Care Note  Patient: Gabriela Shepherd  Procedure(s) Performed: Procedure(s): XI ROBOTIC ASSISTED TOTAL HYSTERECTOMY WITH BILATERAL SALPINGO OOPHORECTOMY (Bilateral)  Patient Location: PACU  Anesthesia Type:General  Level of Consciousness:  sedated, patient cooperative and responds to stimulation  Airway & Oxygen Therapy:Patient Spontanous Breathing and Patient connected to face mask oxgen  Post-op Assessment:  Report given to PACU RN and Post -op Vital signs reviewed and stable  Post vital signs:  Reviewed and stable  Last Vitals:  Filed Vitals:   05/13/15 0834  BP: 130/77  Pulse: 77  Temp: 36.6 C  Resp: 18    Complications: No apparent anesthesia complications

## 2015-05-13 NOTE — Op Note (Signed)
Preoperative Diagnosis: Complex hyperplasia with atypia  Postoperative Diagnosis: No evidence of residual disease.  Procedure(s) Performed: Robotic total laparoscopic hysterectomy, Bilateral salpingo oophorectomy  Anesthesia: GET  Surgeon: Francetta Found.  Skeet Latch, M.D. PhD  Assistant Surgeon:Lisa Delsa Sale  MD.   Specimens: Uterus cervix,ovaries and tubes  Estimated Blood Loss: 480m.   Complications: None  Indication for Procedure: Complex atypical hyperplasia  Operative Findings:  8 cm uterus. Nl  adnexa. Ovary adherent to the left pelvic sidewall  Frozen pathology was consistent with no residual dysplasia  Procedure: Patient was taken to the operating room and placed under general endotracheal anesthesia without any difficulty. She is placed in the dorsal lithotomy position and secured to the operative table over the chest with tape.   The patient was prepped and draped and the uterine manipulator placed within the endometrial cavity. The appropriately sized Koh ring was circumferentially around the cervix. The balloon was placed within the vagina. An OG tube was present and functional. At an area on the left in line with the nipple approximately 2 cm below the ribs the area was infiltrated with 1% lidocaine and a 5 mm Optiview inserted under direct visualization. The abdomen was insufflated to 15 mm of mercury and the pressure never deviated above that throughout the remainder of the procedure. Maximum Trendelenburg positioning was obtained. At approximately 25 cm proximal to the symphysis pubis an incision was made just superior to the umbilicus. This area was infiltrated with lidocaine as well as the location 10 cm lateral to this incision and 10cm superior to the left pf that.   8 mmr robotic ports were placed in the other 3 incisions. The left upper quadrant port site was replaced with a 12 mm port. This was all completed under direct visualization. The small and large bowel were  reflected as much as possible into the upper abdomen. The robot was docked and instruments placed.  The large and small bowel mesentery was very short and this limited mobilization of the bowel outside the pelvis.    The right round ligament was transected and the ureter was identified. The right infundibulopelvic ligament was cauterized and transected.  The retroperitoneal space was entered on the right and the peritoneum incised to the level of the vesicouterine ligament anteriorly. The bladder flap was created using Bovie cautery. The peritoneal dissection was continued inferiorly and across the inferior most aspect of the cervix. In this manner the urethra was deflected inferiorly. The bladder flap was further developed. The ovary was separated from its peritoneal attachment mindful of the location of the ureter.  The uterine vessels on the right were skeletonized ligated and transected.  The left ureter was identified. The left gonadal vessels were cauterized and transected. The broad ligament was skeletonized posteriorly to the level of the cervix and the peritoneum dissected free from the cervix and in this fashion the ureter was deflected inferiorly. The anterior peritoneum was further dissected and the bladder flap appropriately developed. The uterine vessels were skeletonized cauterized and transected. The balloon and the vagina was then maximally insufflated. A colpotomy incision was made circumferentially and the uterus cervix ovaries and tubes were ivered from the vagina. The Koh ring was removed and the balloon was replaced.   The pelvis was copiously irrigated and drained and hemostasis was assured. The vaginal cuff was closed with a running 0 Vicryl suture ligature. The needle was removed under direct visualization. The operative site is once again visualized and hemostasis was assured. The instruments  were removed from the abdomen and pelvis and the port sites irrigated. The subcutaneous  tissue in the left upper quadrant was closed  with an interrupted 0 Vicryl  suture. The subcutaneous tissue of all the port sites was infiltrated with lidocaine.  Skin incisions were closed with a subcuticular suture and dermaond placed over the incision.    The vaginal vault was cleared with a moist sponge stick.  Sponge, lap and needle counts were correct x 3.    The patient had sequential compression devices for VTE prophylaxis.         Disposition: PACU - hemodynamically stable.         Condition:stable Foley draining clear urine.

## 2015-05-13 NOTE — Anesthesia Procedure Notes (Signed)
Procedure Name: Intubation Date/Time: 05/13/2015 10:48 AM Performed by: Maxwell Caul Pre-anesthesia Checklist: Patient identified, Emergency Drugs available, Suction available and Patient being monitored Patient Re-evaluated:Patient Re-evaluated prior to inductionOxygen Delivery Method: Circle System Utilized Preoxygenation: Pre-oxygenation with 100% oxygen Intubation Type: IV induction Ventilation: Mask ventilation without difficulty Laryngoscope Size: Mac and 4 Grade View: Grade II Tube type: Oral Tube size: 7.5 mm Number of attempts: 1 Airway Equipment and Method: Stylet Placement Confirmation: ETT inserted through vocal cords under direct vision,  positive ETCO2 and breath sounds checked- equal and bilateral Secured at: 21 cm Tube secured with: Tape Dental Injury: Teeth and Oropharynx as per pre-operative assessment

## 2015-05-14 ENCOUNTER — Encounter (HOSPITAL_COMMUNITY): Payer: Self-pay | Admitting: Gynecologic Oncology

## 2015-05-14 ENCOUNTER — Encounter: Payer: Self-pay | Admitting: Gynecologic Oncology

## 2015-05-14 DIAGNOSIS — N8502 Endometrial intraepithelial neoplasia [EIN]: Secondary | ICD-10-CM | POA: Diagnosis not present

## 2015-05-14 LAB — BASIC METABOLIC PANEL
ANION GAP: 7 (ref 5–15)
BUN: 13 mg/dL (ref 6–20)
CALCIUM: 8.9 mg/dL (ref 8.9–10.3)
CO2: 28 mmol/L (ref 22–32)
CREATININE: 0.73 mg/dL (ref 0.44–1.00)
Chloride: 103 mmol/L (ref 101–111)
GFR calc non Af Amer: >60 mL/min (ref >60)
Glucose, Bld: 146 mg/dL — ABNORMAL HIGH (ref 65–99)
Potassium: 4.6 mmol/L (ref 3.5–5.1)
Sodium: 138 mmol/L (ref 135–145)

## 2015-05-14 LAB — CBC
HCT: 37.3 % (ref 36.0–46.0)
HEMOGLOBIN: 12.2 g/dL (ref 12.0–15.0)
MCH: 28.9 pg (ref 26.0–34.0)
MCHC: 32.7 g/dL (ref 30.0–36.0)
MCV: 88.4 fL (ref 78.0–100.0)
Platelets: 230 10*3/uL (ref 150–400)
RBC: 4.22 MIL/uL (ref 3.87–5.11)
RDW: 13.5 % (ref 11.5–15.5)
WBC: 10.8 10*3/uL — AB (ref 4.0–10.5)

## 2015-05-14 MED ORDER — DIPHENHYDRAMINE HCL 25 MG PO CAPS
25.0000 mg | ORAL_CAPSULE | Freq: Four times a day (QID) | ORAL | Status: DC | PRN
  Administered 2015-05-14: 25 mg via ORAL
  Filled 2015-05-14: qty 1

## 2015-05-14 MED ORDER — OXYCODONE-ACETAMINOPHEN 5-325 MG PO TABS: 1.0000 | ORAL_TABLET | ORAL | Status: DC | PRN

## 2015-05-14 NOTE — Progress Notes (Signed)
Cold toes bilaterally; NP Cross in room, informed. Pt states history of intermittent burning pain in great toe and h/o bunion removal.   Doppler of right foot: faint dorsalis pedis pulse heard, strong posterior tibial pulse.   Doppler of left foot:  + dorsalis pedis and posterior pulses.

## 2015-05-14 NOTE — Progress Notes (Signed)
Pt. Tolerated flu shot without adverse reaction.

## 2015-05-14 NOTE — Progress Notes (Signed)
Pt stable, vitals WDL, pain addressed successfully with PO analgesics, tolerated diet well, discussed all pt concerns [facility]:EYEM post-op regiment, thorough discussion of discharge instructions, pain medication, when to contact MD or 911, follow-up care appointments, physical limitations; addressed all patient concerns.  Pt. Discharged via wheelchair to go home with son.

## 2015-05-14 NOTE — Discharge Instructions (Addendum)
05/14/2015  Return to work: 4-6 weeks if applicable  Activity: 1. Be up and out of the bed during the day.  Take a nap if needed.  You may walk up steps but be careful and use the hand rail.  Stair climbing will tire you more than you think, you may need to stop part way and rest.   2. No lifting or straining for 6 weeks.  3. No driving for 1 week(s).  Do not drive if you are taking narcotic pain medicine.  4. Shower daily.  Use soap and water on your incision and pat dry; don't rub.  No tub baths until cleared by your surgeon.   5. No sexual activity and nothing in the vagina for 8 weeks.  6. You may experience a small amount of clear drainage from your incisions, which is normal.  If the drainage persists or increases, please call the office.   Diet: 1. Low sodium Heart Healthy Diet is recommended.  2. It is safe to use a laxative, such as Miralax or Colace, if you have difficulty moving your bowels.   Wound Care: 1. Keep clean and dry.  Shower daily.  Reasons to call the Doctor:  Fever - Oral temperature greater than 100.4 degrees Fahrenheit  Foul-smelling vaginal discharge  Difficulty urinating  Nausea and vomiting  Increased pain at the site of the incision that is unrelieved with pain medicine.  Difficulty breathing with or without chest pain  New calf pain especially if only on one side  Sudden, continuing increased vaginal bleeding with or without clots.   Contacts: For questions or concerns you should contact:  Dr. Everitt Amber at [phone]  Joylene John, NP at [phone]  After Hours: call [phone] and have the GYN Oncologist paged/contacted  Abdominal Hysterectomy, Care After These instructions give you information on caring for yourself after your procedure. Your doctor may also give you more specific instructions. Call your doctor if you have any problems or questions after your procedure.  HOME CARE It takes 4-6 weeks to recover from this  surgery. Follow all of your doctor's instructions.   Only take medicines as told by your doctor.  Change your bandage as told by your doctor.  Return to your doctor to have your stitches taken out.  Take showers for 2-3 weeks. Ask your doctor when it is okay to shower.  Do not douche, use tampons, or have sex (intercourse) for at least 6 weeks or as told.  Follow your doctor's advice about exercise, lifting objects, driving, and general activities.  Get plenty of rest and sleep.  Do not lift anything heavier than a gallon of milk (about 10 pounds [4.5 kilograms]) for the first month after surgery.  Get back to your normal diet as told by your doctor.  Do not drink alcohol until your doctor says it is okay.  Take a medicine to help you poop (laxative) as told by your doctor.  Eating foods high in fiber may help you poop. Eat a lot of raw fruits and vegetables, whole grains, and beans.  Drink enough fluids to keep your pee (urine) clear or pale yellow.  Have someone help you at home for 1-2 weeks after your surgery.  Keep follow-up doctor visits as told. GET HELP IF:  You have chills or fever.  You have puffiness, redness, or pain in area of the cut (incision).  You have yellowish-white fluid (pus) coming from the cut.  You have a bad smell coming from  the cut or bandage.  Your cut pulls apart.  You feel dizzy or light-headed.  You have pain or bleeding when you pee.  You keep having watery poop (diarrhea).  You keep feeling sick to your stomach (nauseous) or keep throwing up (vomiting).  You have fluid (discharge) coming from your vagina.  You have a rash.  You have a reaction to your medicine.  You need stronger pain medicine. GET HELP RIGHT AWAY IF:   You have a fever and your symptoms suddenly get worse.  You have bad belly (abdominal) pain.  You have chest pain.  You are short of breath.  You pass out (faint).  You have pain, puffiness, or  redness of your leg.  You bleed a lot from your vagina and notice clumps of tissue (clots). MAKE SURE YOU:   Understand these instructions.  Will watch your condition.  Will get help right away if you are not doing well or get worse.   This information is not intended to replace advice given to you by your health care provider. Make sure you discuss any questions you have with your health care provider.   Document Released: 04/18/2008 Document Revised: 07/15/2013 Document Reviewed: 05/02/2013 Elsevier Interactive Patient Education 2016 [employer]  Acetaminophen; Oxycodone tablets What is this medicine? ACETAMINOPHEN; OXYCODONE (a set a MEE noe fen; ox i KOE done) is a pain reliever. It is used to treat moderate to severe pain. This medicine may be used for other purposes; ask your health care provider or pharmacist if you have questions. What should I tell my health care provider before I take this medicine? They need to know if you have any of these conditions: -brain tumor -Crohn's disease, inflammatory bowel disease, or ulcerative colitis -drug abuse or addiction -head injury -heart or circulation problems -if you often drink alcohol -kidney disease or problems going to the bathroom -liver disease -lung disease, asthma, or breathing problems -an unusual or allergic reaction to acetaminophen, oxycodone, other opioid analgesics, other medicines, foods, dyes, or preservatives -pregnant or trying to get pregnant -breast-feeding How should I use this medicine? Take this medicine by mouth with a full glass of water. Follow the directions on the prescription label. You can take it with or without food. If it upsets your stomach, take it with food. Take your medicine at regular intervals. Do not take it more often than directed. Talk to your pediatrician regarding the use of this medicine in children. Special care may be needed. Patients over 70 years old may have a stronger  reaction and need a smaller dose. Overdosage: If you think you have taken too much of this medicine contact a poison control center or emergency room at once. NOTE: This medicine is only for you. Do not share this medicine with others. What if I miss a dose? If you miss a dose, take it as soon as you can. If it is almost time for your next dose, take only that dose. Do not take double or extra doses. What may interact with this medicine? -alcohol -antihistamines -barbiturates like amobarbital, butalbital, butabarbital, methohexital, pentobarbital, phenobarbital, thiopental, and secobarbital -benztropine -drugs for bladder problems like solifenacin, trospium, oxybutynin, tolterodine, hyoscyamine, and methscopolamine -drugs for breathing problems like ipratropium and tiotropium -drugs for certain stomach or intestine problems like propantheline, homatropine methylbromide, glycopyrrolate, atropine, belladonna, and dicyclomine -general anesthetics like etomidate, ketamine, nitrous oxide, propofol, desflurane, enflurane, halothane, isoflurane, and sevoflurane -medicines for depression, anxiety, or psychotic disturbances -medicines for sleep -muscle relaxants -  naltrexone -narcotic medicines (opiates) for pain -phenothiazines like perphenazine, thioridazine, chlorpromazine, mesoridazine, fluphenazine, prochlorperazine, promazine, and trifluoperazine -scopolamine -tramadol -trihexyphenidyl This list may not describe all possible interactions. Give your health care provider a list of all the medicines, herbs, non-prescription drugs, or dietary supplements you use. Also tell them if you smoke, drink alcohol, or use illegal drugs. Some items may interact with your medicine. What should I watch for while using this medicine? Tell your doctor or health care professional if your pain does not go away, if it gets worse, or if you have new or a different type of pain. You may develop tolerance to the  medicine. Tolerance means that you will need a higher dose of the medication for pain relief. Tolerance is normal and is expected if you take this medicine for a long time. Do not suddenly stop taking your medicine because you may develop a severe reaction. Your body becomes used to the medicine. This does NOT mean you are addicted. Addiction is a behavior related to getting and using a drug for a non-medical reason. If you have pain, you have a medical reason to take pain medicine. Your doctor will tell you how much medicine to take. If your doctor wants you to stop the medicine, the dose will be slowly lowered over time to avoid any side effects. You may get drowsy or dizzy. Do not drive, use machinery, or do anything that needs mental alertness until you know how this medicine affects you. Do not stand or sit up quickly, especially if you are an older patient. This reduces the risk of dizzy or fainting spells. Alcohol may interfere with the effect of this medicine. Avoid alcoholic drinks. There are different types of narcotic medicines (opiates) for pain. If you take more than one type at the same time, you may have more side effects. Give your health care provider a list of all medicines you use. Your doctor will tell you how much medicine to take. Do not take more medicine than directed. Call emergency for help if you have problems breathing. The medicine will cause constipation. Try to have a bowel movement at least every 2 to 3 days. If you do not have a bowel movement for 3 days, call your doctor or health care professional. Do not take Tylenol (acetaminophen) or medicines that have acetaminophen with this medicine. Too much acetaminophen can be very dangerous. Many nonprescription medicines contain acetaminophen. Always read the labels carefully to avoid taking more acetaminophen. What side effects may I notice from receiving this medicine? Side effects that you should report to your doctor or health  care professional as soon as possible: -allergic reactions like skin rash, itching or hives, swelling of the face, lips, or tongue -breathing difficulties, wheezing -confusion -light headedness or fainting spells -severe stomach pain -unusually weak or tired -yellowing of the skin or the whites of the eyes Side effects that usually do not require medical attention (report to your doctor or health care professional if they continue or are bothersome): -dizziness -drowsiness -nausea -vomiting This list may not describe all possible side effects. Call your doctor for medical advice about side effects. You may report side effects to FDA at 1-800-FDA-1088. Where should I keep my medicine? Keep out of the reach of children. This medicine can be abused. Keep your medicine in a safe place to protect it from theft. Do not share this medicine with anyone. Selling or giving away this medicine is dangerous and against the law. This  medicine may cause accidental overdose and death if it taken by other adults, children, or pets. Mix any unused medicine with a substance like cat litter or coffee grounds. Then throw the medicine away in a sealed container like a sealed bag or a coffee can with a lid. Do not use the medicine after the expiration date. Store at room temperature between 20 and 25 degrees C (68 and 77 degrees F). NOTE: This sheet is a summary. It may not cover all possible information. If you have questions about this medicine, talk to your doctor, pharmacist, or health care provider.    2016, Elsevier/Gold Standard. (2014-06-10 15:18:46)

## 2015-05-14 NOTE — Discharge Summary (Signed)
Physician Discharge Summary  Patient ID: QUINISHA MOULD MRN: [id number] DOB/AGE: 1955-12-26 59 y.o.  Admit date: 05/13/2015 Discharge date: 05/14/2015  Admission Diagnoses: Endometrial hyperplasia  Discharge Diagnoses:  Principal Problem:   Endometrial hyperplasia   Discharged Condition:  The patient is in good condition and stable for discharge.   Hospital Course: On 05/13/2015, the patient underwent the following: Procedure(s): XI ROBOTIC ASSISTED TOTAL HYSTERECTOMY WITH BILATERAL SALPINGO OOPHORECTOMY.  The postoperative course was uneventful.  She was discharged to home on postoperative day 1 tolerating a regular diet, voiding without difficulty, minimal pain.  Consults: None  Significant Diagnostic Studies: None  Treatments: surgery: see above  Discharge Exam: Blood pressure 119/51, pulse 84, temperature 98.3 F (36.8 C), temperature source Axillary, resp. rate 18, height 5\' 9"  (1.753 m), weight 266 lb (120.657 kg), last menstrual period 11/21/2009, SpO2 98 %. General appearance: alert, cooperative and no distress Resp: clear to auscultation bilaterally Cardio: regular rate and rhythm, S1, S2 normal, no murmur, click, rub or gallop GI: abdomen morbidly obese, active bowel sounds, ecchymosis present around incision at the umbilicus, abdomen soft except for mild firmness in the upper abdomen Extremities: extremities normal, atraumatic, no cyanosis or edema and Toes cool to the touch, adequate pulses palpated Incision/Wound:  Lap sites with dermabond without erythema or drainage, ecchymosis around the incision at the umbilicus  Disposition: 62-XBMW or Self Care  Discharge Instructions    Call MD for:  difficulty breathing, headache or visual disturbances    Complete by:  As directed      Call MD for:  extreme fatigue    Complete by:  As directed      Call MD for:  hives    Complete by:  As directed      Call MD for:  persistant dizziness or light-headedness     Complete by:  As directed      Call MD for:  persistant nausea and vomiting    Complete by:  As directed      Call MD for:  redness, tenderness, or signs of infection (pain, swelling, redness, odor or green/yellow discharge around incision site)    Complete by:  As directed      Call MD for:  severe uncontrolled pain    Complete by:  As directed      Call MD for:  temperature >100.4    Complete by:  As directed      Diet - low sodium heart healthy    Complete by:  As directed      Driving Restrictions    Complete by:  As directed   No driving for 1 week.  Do not take narcotics and drive.     Increase activity slowly    Complete by:  As directed      Lifting restrictions    Complete by:  As directed   No lifting greater than 10 lbs.     Sexual Activity Restrictions    Complete by:  As directed   No sexual activity, nothing in the vagina, for 8 weeks.            Medication List    TAKE these medications        acetaminophen 500 MG tablet  Commonly known as:  TYLENOL  Take 1,000 mg by mouth every 6 (six) hours as needed for mild pain or headache.     albuterol 108 (90 BASE) MCG/ACT inhaler  Commonly known as:  PROAIR HFA  Inhale 1-2 puffs  into the lungs every 6 (six) hours as needed for wheezing or shortness of breath.     ASPERCREME EX  Apply 1 application topically once as needed (knee pain.).     CEPACOL REGULAR STRENGTH 3 MG lozenge  Generic drug:  menthol-cetylpyridinium  Take 1 lozenge by mouth as needed for sore throat.     multivitamin tablet  Take 1 tablet by mouth every morning.     naproxen sodium 220 MG tablet  Commonly known as:  ANAPROX  Take 440 mg by mouth 2 (two) times daily as needed (pain).     NON FORMULARY  once a week. Allergy vaccines 1:10 weekly GH     oxyCODONE-acetaminophen 5-325 MG tablet  Commonly known as:  PERCOCET/ROXICET  Take 1-2 tablets by mouth every 4 (four) hours as needed for severe pain.     pantoprazole 40 MG tablet   Commonly known as:  PROTONIX  Take 1 tablet (40 mg total) by mouth every morning.     valsartan-hydrochlorothiazide 160-25 MG tablet  Commonly known as:  DIOVAN-HCT  Take 1 tablet by mouth daily.     verapamil 120 MG tablet  Commonly known as:  CALAN  Take 1 tablet (120 mg total) by mouth daily.           Follow-up Information    Follow up with Donaciano Eva, MD On 06/04/2015.   Specialty:  Obstetrics and Gynecology   Why:  at 10:45 am at the [facility] for post-op follow up   Contact information:   [address] [place] [address] [phone]       Greater than thirty minutes were spend for face to face discharge instructions and discharge orders/summary in EPIC.   Signed: Kosisochukwu Goldberg DEAL 05/14/2015, 11:33 AM

## 2015-05-14 NOTE — Progress Notes (Signed)
15:15  Pt experiencing pruritus;  NP M. Cross notified; Benadryl given as ordered. 16:00  Pruritus diminished.  05/14/15 16:02  Waynette Buttery, RN

## 2015-05-17 ENCOUNTER — Telehealth: Payer: Self-pay | Admitting: Gynecologic Oncology

## 2015-05-17 NOTE — Telephone Encounter (Signed)
Post op telephone call to check patient status.  Patient describes expected post operative status.  Adequate PO intake reported.  Bowels and bladder functioning without difficulty.  Pain minimal.  Reportable signs and symptoms reviewed.  Follow up appt arranged.  Final path discussed.  Advised to call for any questions or concerns.

## 2015-05-18 NOTE — Anesthesia Postprocedure Evaluation (Signed)
Anesthesia Post Note  Patient: Gabriela Shepherd  Procedure(s) Performed: Procedure(s) (LRB): XI ROBOTIC ASSISTED TOTAL HYSTERECTOMY WITH BILATERAL SALPINGO OOPHORECTOMY (Bilateral)  Anesthesia type: General  Patient location: PACU  Post pain: Pain level controlled and Adequate analgesia  Post assessment: Post-op Vital signs reviewed, Patient's Cardiovascular Status Stable, Respiratory Function Stable, Patent Airway and Pain level controlled  Last Vitals:  Filed Vitals:   05/14/15 1347  BP: 106/49  Pulse: 67  Temp: 36.7 C  Resp: 18    Post vital signs: Reviewed and stable  Level of consciousness: awake, alert  and oriented  Complications: No apparent anesthesia complications

## 2015-06-04 ENCOUNTER — Encounter: Payer: Self-pay | Admitting: Gynecologic Oncology

## 2015-06-04 ENCOUNTER — Ambulatory Visit: Payer: BLUE CROSS/BLUE SHIELD | Attending: Gynecologic Oncology | Admitting: Gynecologic Oncology

## 2015-06-04 VITALS — BP 128/66 | HR 77 | Temp 97.7°F | Resp 18 | Ht 69.0 in | Wt 266.4 lb

## 2015-06-04 DIAGNOSIS — N8502 Endometrial intraepithelial neoplasia [EIN]: Secondary | ICD-10-CM | POA: Diagnosis not present

## 2015-06-04 NOTE — Progress Notes (Signed)
POSTOPERATIVE FOLLOWUP  Assessment:    59 y.o. year old with complex atypical hyperplasia .   S/p robotic hysterectomy, BSO on 05/13/15.   Plan: 1) Pathology reports reviewed today 2) Treatment counseling - I discussed that her pathology is benign and no further intervention is required. She was given the opportunity to ask questions, which were answered to her satisfaction, and she is agreement with the above mentioned plan of care.  3)  Return to clinic on a prn basis, she should return to see Dr Sabra Heck in 12 months for annual well-woman care.  HPI:  Gabriela Shepherd is a 59 y.o. year old [id number] initially seen in consultation on 04/19/15 for CAH.  She then underwent a robotic hysterectomy, BSO  on 0000000 without complications.  Her postoperative course was uncomplicated.  Her final pathology revealed complex atypical hyperplasia with no malignancy, normal tubes and ovaries.  She is seen today for a postoperative check and to discuss her pathology results and ongoing plan.  Since discharge from the hospital, she is feeling well.  She has improving appetite, normal bowel and bladder function, and pain controlled with minimal PO medication. She has vaginal spotting most days but not heavy bleeding. She has no other complaints today.    Review of systems: Constitutional:  She has no weight gain or weight loss. She has no fever or chills. Eyes: No blurred vision Ears, Nose, Mouth, Throat: No dizziness, headaches or changes in hearing. No mouth sores. Cardiovascular: No chest pain, palpitations or edema. Respiratory:  No shortness of breath, wheezing or cough Gastrointestinal: She has normal bowel movements without diarrhea or constipation. She denies any nausea or vomiting. She denies blood in her stool or heart burn. Genitourinary:  She denies pelvic pain, pelvic pressure or changes in her urinary function. She has no hematuria, dysuria, or incontinence. She has no irregular vaginal  bleeding or vaginal discharge Musculoskeletal: Denies muscle weakness or joint pains.  Skin:  She has no skin changes, rashes or itching Neurological:  Denies dizziness or headaches. No neuropathy, no numbness or tingling. Psychiatric:  She denies depression or anxiety. Hematologic/Lymphatic:   No easy bruising or bleeding   Physical Exam: Blood pressure 128/66, pulse 77, temperature 97.7 F (36.5 C), temperature source Oral, resp. rate 18, height 5\' 9"  (1.753 m), weight 266 lb 6.4 oz (120.838 kg), last menstrual period 11/21/2009, SpO2 97 %. General: Well dressed, well nourished in no apparent distress.   HEENT:  Normocephalic and atraumatic, no lesions.  Extraocular muscles intact. Sclerae anicteric. Pupils equal, round, reactive. No mouth sores or ulcers. Thyroid is normal size, not nodular, midline. Skin:  No lesions or rashes. Abdomen:  Soft, nontender, nondistended.  No palpable masses.  No hepatosplenomegaly.  No ascites. Normal bowel sounds.  No hernias.  Incisions are well healed  Genitourinary: Normal EGBUS  Vaginal cuff intact.  No bleeding or discharge.  No cul de sac fullness. Extremities: No cyanosis, clubbing or edema.  No calf tenderness or erythema. No palpable cords. Psychiatric: Mood and affect are appropriate. Neurological: Awake, alert and oriented x 3. Sensation is intact, no neuropathy.  Musculoskeletal: No pain, normal strength and range of motion.  Donaciano Eva, MD

## 2015-06-04 NOTE — Patient Instructions (Signed)
Follow up for a yearly Well Woman Check and call us with any vaginal bleeding or questions or concerns.   Thank you

## 2015-06-08 NOTE — Progress Notes (Signed)
So no yearly pap?

## 2015-06-29 ENCOUNTER — Telehealth: Payer: Self-pay | Admitting: Emergency Medicine

## 2015-06-29 NOTE — Telephone Encounter (Signed)
-----   Message from Megan Salon, MD sent at 06/28/2015  5:19 PM EST ----- Regarding: appt Olivia Mackie, Can you call pt and create a phone note?  Pt had hysterectomy with Dr. Denman George in the fall for complex endometrial hyperplasia.  Her final pathology did not show cancer.  Dr. Denman George wants her seen in a year.  Claiborne Billings made her an appt for 12/17 and cancelled the 5/17 appt with Debbi but I'm not sure the pt was called about this.  Can you call her and make sure she is aware of this?  Thanks.  Vinnie Level

## 2015-06-29 NOTE — Telephone Encounter (Signed)
Patient has appointment with Dr. Sabra Heck 06/26/16 and will keep as scheduled.  She is agreeable.  Routing to provider for final review. Patient agreeable to disposition. Will close encounter.

## 2015-07-28 ENCOUNTER — Encounter: Payer: Self-pay | Admitting: Family Medicine

## 2015-07-30 NOTE — Telephone Encounter (Signed)
Yes they all go together well

## 2015-07-30 NOTE — Telephone Encounter (Signed)
Patient notified - patient verbalized understanding.

## 2015-09-27 ENCOUNTER — Other Ambulatory Visit: Payer: Self-pay | Admitting: Family Medicine

## 2015-11-18 ENCOUNTER — Encounter: Payer: Self-pay | Admitting: Gastroenterology

## 2015-12-10 ENCOUNTER — Ambulatory Visit: Payer: BLUE CROSS/BLUE SHIELD | Admitting: Certified Nurse Midwife

## 2015-12-24 ENCOUNTER — Encounter: Payer: Self-pay | Admitting: Internal Medicine

## 2016-01-14 ENCOUNTER — Other Ambulatory Visit: Payer: Self-pay | Admitting: Family Medicine

## 2016-01-24 ENCOUNTER — Telehealth: Payer: Self-pay | Admitting: Family Medicine

## 2016-01-24 NOTE — Telephone Encounter (Signed)
Pt would like to know if she needs to make a appointment for a refill of the following medications verapamil and valsartan.  She is available on Mon and Fri anytime    Tue Wed Thurs afternoon (after 2:00)

## 2016-01-24 NOTE — Telephone Encounter (Signed)
I left a voice message for pt, scripts were sent to mail order on 01/14/2016 for a 90 day supply, pt due for physical in August 2017.

## 2016-02-21 ENCOUNTER — Other Ambulatory Visit (INDEPENDENT_AMBULATORY_CARE_PROVIDER_SITE_OTHER): Payer: BLUE CROSS/BLUE SHIELD

## 2016-02-21 DIAGNOSIS — Z0000 Encounter for general adult medical examination without abnormal findings: Secondary | ICD-10-CM | POA: Diagnosis not present

## 2016-02-21 LAB — CBC WITH DIFFERENTIAL/PLATELET
Basophils Absolute: 0 10*3/uL (ref 0.0–0.1)
Basophils Relative: 0.6 % (ref 0.0–3.0)
EOS PCT: 2 % (ref 0.0–5.0)
Eosinophils Absolute: 0.1 10*3/uL (ref 0.0–0.7)
HCT: 39.8 % (ref 36.0–46.0)
Hemoglobin: 13.6 g/dL (ref 12.0–15.0)
LYMPHS ABS: 1.9 10*3/uL (ref 0.7–4.0)
Lymphocytes Relative: 30.8 % (ref 12.0–46.0)
MCHC: 34.1 g/dL (ref 30.0–36.0)
MCV: 83.3 fl (ref 78.0–100.0)
MONOS PCT: 5.8 % (ref 3.0–12.0)
Monocytes Absolute: 0.4 10*3/uL (ref 0.1–1.0)
NEUTROS ABS: 3.7 10*3/uL (ref 1.4–7.7)
Neutrophils Relative %: 60.8 % (ref 43.0–77.0)
PLATELETS: 237 10*3/uL (ref 150.0–400.0)
RBC: 4.77 Mil/uL (ref 3.87–5.11)
RDW: 14.2 % (ref 11.5–15.5)
WBC: 6.1 10*3/uL (ref 4.0–10.5)

## 2016-02-21 LAB — LIPID PANEL
Cholesterol: 193 mg/dL (ref 0–200)
HDL: 49.1 mg/dL (ref >39.00)
LDL CALC: 120 mg/dL — AB (ref 0–99)
NONHDL: 144.35
TRIGLYCERIDES: 123 mg/dL (ref 0.0–149.0)
Total CHOL/HDL Ratio: 4
VLDL: 24.6 mg/dL (ref 0.0–40.0)

## 2016-02-21 LAB — POC URINALSYSI DIPSTICK (AUTOMATED)
Bilirubin, UA: NEGATIVE
GLUCOSE UA: NEGATIVE
KETONES UA: NEGATIVE
Leukocytes, UA: NEGATIVE
Nitrite, UA: NEGATIVE
Urobilinogen, UA: 1
pH, UA: 6

## 2016-02-21 LAB — BASIC METABOLIC PANEL
BUN: 17 mg/dL (ref 6–23)
CO2: 29 meq/L (ref 19–32)
Calcium: 9.2 mg/dL (ref 8.4–10.5)
Chloride: 103 mEq/L (ref 96–112)
Creatinine, Ser: 0.77 mg/dL (ref 0.40–1.20)
GFR: 81.69 mL/min (ref >60.00)
GLUCOSE: 93 mg/dL (ref 70–99)
POTASSIUM: 3.9 meq/L (ref 3.5–5.1)
SODIUM: 141 meq/L (ref 135–145)

## 2016-02-21 LAB — HEPATIC FUNCTION PANEL
ALK PHOS: 77 U/L (ref 39–117)
ALT: 25 U/L (ref 0–35)
AST: 20 U/L (ref 0–37)
Albumin: 4.1 g/dL (ref 3.5–5.2)
BILIRUBIN DIRECT: 0.1 mg/dL (ref 0.0–0.3)
BILIRUBIN TOTAL: 0.5 mg/dL (ref 0.2–1.2)
Total Protein: 7.1 g/dL (ref 6.0–8.3)

## 2016-02-21 LAB — TSH: TSH: 3.62 u[IU]/mL (ref 0.35–4.50)

## 2016-02-29 ENCOUNTER — Encounter: Payer: BLUE CROSS/BLUE SHIELD | Admitting: Family Medicine

## 2016-03-01 ENCOUNTER — Encounter: Payer: Self-pay | Admitting: Family Medicine

## 2016-03-01 ENCOUNTER — Ambulatory Visit (INDEPENDENT_AMBULATORY_CARE_PROVIDER_SITE_OTHER): Payer: BLUE CROSS/BLUE SHIELD | Admitting: Family Medicine

## 2016-03-01 VITALS — BP 136/78 | Temp 98.0°F | Ht 69.0 in | Wt 272.0 lb

## 2016-03-01 DIAGNOSIS — Z0000 Encounter for general adult medical examination without abnormal findings: Secondary | ICD-10-CM

## 2016-03-01 MED ORDER — VERAPAMIL HCL 120 MG PO TABS: ORAL_TABLET | ORAL | 3 refills | Status: DC

## 2016-03-01 MED ORDER — PANTOPRAZOLE SODIUM 40 MG PO TBEC: DELAYED_RELEASE_TABLET | ORAL | 3 refills | Status: DC

## 2016-03-01 MED ORDER — VALSARTAN-HYDROCHLOROTHIAZIDE 160-25 MG PO TABS: ORAL_TABLET | ORAL | 3 refills | Status: DC

## 2016-03-01 NOTE — Progress Notes (Signed)
   Subjective:    Patient ID: Gabriela Shepherd, female    DOB: 06-11-1956, 60 y.o.   MRN: [id number]  HPI 60 yr old female for a well exam. She feels well. She is frustrated by the fact that she has not been able to find a job for the past year and a half. Her BP is stable.    Review of Systems  Constitutional: Negative.   HENT: Negative.   Eyes: Negative.   Respiratory: Negative.   Cardiovascular: Negative.   Gastrointestinal: Negative.   Genitourinary: Negative for decreased urine volume, difficulty urinating, dyspareunia, dysuria, enuresis, flank pain, frequency, hematuria, pelvic pain and urgency.  Musculoskeletal: Negative.   Skin: Negative.   Neurological: Negative.   Psychiatric/Behavioral: Negative.        Objective:   Physical Exam  Constitutional: She is oriented to person, place, and time. She appears well-developed and well-nourished. No distress.  HENT:  Head: Normocephalic and atraumatic.  Right Ear: External ear normal.  Left Ear: External ear normal.  Nose: Nose normal.  Mouth/Throat: Oropharynx is clear and moist. No oropharyngeal exudate.  Eyes: Conjunctivae and EOM are normal. Pupils are equal, round, and reactive to light. No scleral icterus.  Neck: Normal range of motion. Neck supple. No JVD present. No thyromegaly present.  Cardiovascular: Normal rate, regular rhythm, normal heart sounds and intact distal pulses.  Exam reveals no gallop and no friction rub.   No murmur heard. Pulmonary/Chest: Effort normal and breath sounds normal. No respiratory distress. She has no wheezes. She has no rales. She exhibits no tenderness.  Abdominal: Soft. Bowel sounds are normal. She exhibits no distension and no mass. There is no tenderness. There is no rebound and no guarding.  Musculoskeletal: Normal range of motion. She exhibits no edema or tenderness.  Lymphadenopathy:    She has no cervical adenopathy.  Neurological: She is alert and oriented to person, place, and  time. She has normal reflexes. No cranial nerve deficit. She exhibits normal muscle tone. Coordination normal.  Skin: Skin is warm and dry. No rash noted. No erythema.  Psychiatric: She has a normal mood and affect. Her behavior is normal. Judgment and thought content normal.          Assessment & Plan:  Well exam. We discussed diet and exercise. She is due for another colonoscopy this year but understandably she wants to wait until she has health insurance again to set this up. Laurey Morale, MD

## 2016-03-01 NOTE — Progress Notes (Signed)
Pre visit review using our clinic review tool, if applicable. No additional management support is needed unless otherwise documented below in the visit note. 

## 2016-03-21 ENCOUNTER — Ambulatory Visit (INDEPENDENT_AMBULATORY_CARE_PROVIDER_SITE_OTHER): Payer: BLUE CROSS/BLUE SHIELD | Admitting: Family Medicine

## 2016-03-21 ENCOUNTER — Encounter: Payer: Self-pay | Admitting: Family Medicine

## 2016-03-21 VITALS — BP 138/78 | Temp 98.2°F | Ht 69.0 in | Wt 272.0 lb

## 2016-03-21 DIAGNOSIS — L0591 Pilonidal cyst without abscess: Secondary | ICD-10-CM

## 2016-03-21 NOTE — Progress Notes (Signed)
Pre visit review using our clinic review tool, if applicable. No additional management support is needed unless otherwise documented below in the visit note. 

## 2016-03-21 NOTE — Progress Notes (Signed)
   Subjective:    Patient ID: Gabriela Shepherd, female    DOB: 1955/11/23, 60 y.o.   MRN: [id number]  HPI Here to check a small open wound over the tailbone that she noticed 2 weeks ago. It is mildly tender. It is not draining or bleeding. Of note her son had surgery at a young age for a pilonidal cyst.    Review of Systems  Constitutional: Negative.   Gastrointestinal: Negative.   Genitourinary: Negative.   Skin: Positive for wound.       Objective:   Physical Exam  Constitutional: She appears well-developed and well-nourished.  Cardiovascular: Normal rate, regular rhythm, normal heart sounds and intact distal pulses.   Pulmonary/Chest: Effort normal and breath sounds normal.  Skin:  1.5 cm open wound with clean granulation tissue at the base over the sacrum in the intergluteal fold           Assessment & Plan:  This is a pilonidal cyst that appears to have opened and drained. It is in the process of healing. She will keep it clean and apply Neosporin bid.  Laurey Morale, MD

## 2016-06-26 ENCOUNTER — Ambulatory Visit: Payer: BLUE CROSS/BLUE SHIELD | Admitting: Obstetrics & Gynecology

## 2016-08-07 ENCOUNTER — Other Ambulatory Visit: Payer: Self-pay | Admitting: Certified Nurse Midwife

## 2016-08-07 DIAGNOSIS — Z1231 Encounter for screening mammogram for malignant neoplasm of breast: Secondary | ICD-10-CM

## 2016-08-14 ENCOUNTER — Ambulatory Visit (INDEPENDENT_AMBULATORY_CARE_PROVIDER_SITE_OTHER): Payer: BLUE CROSS/BLUE SHIELD | Admitting: Obstetrics & Gynecology

## 2016-08-14 ENCOUNTER — Encounter: Payer: Self-pay | Admitting: Obstetrics & Gynecology

## 2016-08-14 VITALS — BP 138/72 | HR 72 | Resp 12 | Ht 67.0 in | Wt 275.0 lb

## 2016-08-14 DIAGNOSIS — Z01419 Encounter for gynecological examination (general) (routine) without abnormal findings: Secondary | ICD-10-CM | POA: Diagnosis not present

## 2016-08-14 NOTE — Progress Notes (Addendum)
61 y.o. [id number] SingleCaucasianF here for annual exam.  Doing well.  Denies vaginal bleeding.    Patient's last menstrual period was 11/21/2009.          Sexually active: No.  The current method of family planning is status post hysterectomy.    Exercising: No.  The patient does not participate in regular exercise at present. Smoker:  no  Health Maintenance: Pap:  12/10/14 negative, HR HPV negative History of abnormal Pap:  yes MMG:  04/15/15 BIRADS 1 negative.  Appt schedule 2/18. Colonoscopy:  5/10- polyps- repeat 10 years  BMD:   never TDaP:  2009  Pneumonia vaccine(s):  never Zostavax:   never Hep C testing: reviewed lab work.  She will have done with Dr. Sarajane Jews at next visit. Screening Labs: PCP, Hb today: PCP, Urine today: PCP   reports that she has never smoked. She has never used smokeless tobacco. She reports that she drinks alcohol. She reports that she does not use drugs.  Past Medical History:  Diagnosis Date  . Allergic rhinitis    sees Dr. Annamaria Boots  . Anxiety   . Asthma    sees Dr. Annamaria Boots  . Carpal tunnel syndrome    sees Dr. Amedeo Plenty  . GERD (gastroesophageal reflux disease)   . Gynecological examination    sees Dr. Elyse Hsu  . Headache(784.0)    hx of pt states BP meds has improved   . Hypertension   . Osteoarthritis     Past Surgical History:  Procedure Laterality Date  . BUNIONECTOMY    . CARPAL TUNNEL RELEASE    . CERVICAL POLYPECTOMY N/A 04/05/2015   Procedure: CERVICAL POLYPECTOMY removal ;  Surgeon: Megan Salon, MD;  Location: [facility];  Service: Gynecology;  Laterality: N/A;  . COLONOSCOPY  11-27-08   per Dr. Olevia Perches, hyperplastic polyps, repeat in 7 yrs   . DILATATION & CURRETTAGE/HYSTEROSCOPY WITH RESECTOCOPE N/A 04/05/2015   Procedure: DILATATION & CURETTAGE/HYSTEROSCOPY ;  Surgeon: Megan Salon, MD;  Location: [facility];  Service: Gynecology;  Laterality: N/A;  . DILATION AND CURETTAGE OF UTERUS    . hysteroscopic polyp resection    . KNEE  ARTHROSCOPY    . REFRACTIVE SURGERY     related to hole in back of left eye as stated per pt   . ROBOTIC ASSISTED TOTAL HYSTERECTOMY WITH BILATERAL SALPINGO OOPHERECTOMY Bilateral 05/13/2015   Procedure: XI ROBOTIC ASSISTED TOTAL HYSTERECTOMY WITH BILATERAL SALPINGO OOPHORECTOMY;  Surgeon: Janie Morning, MD;  Location: [facility];  Service: Gynecology;  Laterality: Bilateral;  . TOTAL KNEE ARTHROPLASTY Left 05/04/2014   Procedure: LEFT TOTAL KNEE ARTHROPLASTY;  Surgeon: Mauri Pole, MD;  Location: [facility];  Service: Orthopedics;  Laterality: Left;  . TUBAL LIGATION      Current Outpatient Prescriptions  Medication Sig Dispense Refill  . Multiple Vitamin (MULTIVITAMIN) tablet Take 1 tablet by mouth every morning.     . naproxen sodium (ANAPROX) 220 MG tablet Take 440 mg by mouth 2 (two) times daily as needed (pain).    . pantoprazole (PROTONIX) 40 MG tablet TAKE 1 BY MOUTH EVERY MORNING 90 tablet 3  . valsartan-hydrochlorothiazide (DIOVAN-HCT) 160-25 MG tablet TAKE 1 BY MOUTH DAILY 90 tablet 3  . verapamil (CALAN) 120 MG tablet TAKE 1 BY MOUTH DAILY 90 tablet 3   No current facility-administered medications for this visit.     Family History  Problem Relation Age of Onset  . Hypertension Mother   . Liver cancer Mother   .  Heart disease Father   . Hypertension Father   . Diabetes Maternal Grandmother   . Cancer Maternal Aunt     ROS:  Pertinent items are noted in HPI.  Otherwise, a comprehensive ROS was negative.  Exam:   BP 138/72 (BP Location: Right Arm, Patient Position: Sitting, Cuff Size: Large)   Pulse 72   Resp 12   Ht 5\' 7"  (1.702 m)   Wt 275 lb (124.7 kg)   LMP 11/21/2009   BMI 43.07 kg/m    Height: 5\' 7"  (170.2 cm)  Ht Readings from Last 3 Encounters:  08/14/16 5\' 7"  (1.702 m)  03/21/16 5\' 9"  (1.753 m)  03/01/16 5\' 9"  (1.753 m)    General appearance: alert, cooperative and appears stated age Head: Normocephalic, without obvious abnormality, atraumatic Neck: no  adenopathy, supple, symmetrical, trachea midline and thyroid normal to inspection and palpation Lungs: clear to auscultation bilaterally Breasts: normal appearance, no masses or tenderness Heart: regular rate and rhythm Abdomen: soft, non-tender; bowel sounds normal; no masses,  no organomegaly Extremities: extremities normal, atraumatic, no cyanosis or edema Skin: Skin color, texture, turgor normal. No rashes or lesions Lymph nodes: Cervical, supraclavicular, and axillary nodes normal. No abnormal inguinal nodes palpated Neurologic: Grossly normal   Pelvic: External genitalia:  no lesions              Urethra:  normal appearing urethra with no masses, tenderness or lesions              Bartholins and Skenes: normal                 Vagina: normal appearing vagina with normal color and discharge, no lesions              Cervix: absent              Pap taken: No. Bimanual Exam:  Uterus:  uterus absent              Adnexa: no mass, fullness, tenderness               Rectovaginal: Confirms               Anus:  normal sphincter tone, no lesions  Chaperone was present for exam.  A:  Well Woman with normal exam H/o complex endometrial hyperplasia with atypia Hypertension Obesity  P:         Mammogram guidelines reviewed.  Pt has a MMG scheduled in February Hep C antibody testing recommended.  She will do this with Dr. Sarajane Jews. Will check with [facility] about when her colonoscopy is due AEX 1 year or follow up prn  Addendum:  [facility] was called and I was advised that she was due last year for her colonoscopy follow-up.  Pt will be notified.

## 2016-08-15 ENCOUNTER — Telehealth: Payer: Self-pay | Admitting: *Deleted

## 2016-08-15 NOTE — Telephone Encounter (Signed)
Left message per DPR letting patient know her colonoscopy is past due after speaking with [facility]. Instructed patient to call if she had any questions.    Routing to provider for final review. Patient agreeable to disposition. Will close encounter.

## 2016-08-15 NOTE — Telephone Encounter (Signed)
-----   Message from Megan Salon, MD sent at 08/15/2016 12:20 PM EST ----- Regarding: RE: can you call about when her colononscopy is due Can you please let her know this and document it in a phone note?  I updated my note from yesterday with this information in it.  Thanks.  Vinnie Level  ----- Message ----- From: Matthew Folks, RN Sent: 08/14/2016   3:28 PM To: Megan Salon, MD Subject: RE: can you call about when her colononscopy#  Dr. Sabra Heck,  I spoke with Jinny Blossom at [facility] and told her per Dr. Nichola Sizer note from colonoscopy in 2010, it said repeat colonoscopy due in 10 years. She states patient is past due and should have had a repeat in 2017.   Thanks,  Raquel Sarna ----- Message ----- From: Megan Salon, MD Sent: 08/14/2016   2:52 PM To: Matthew Folks, RN Subject: can you call about when her colononscopy is #  Can you check about when her last colonoscopy is due.  The note from the procedure done by Dr. Olevia Perches said 10 years.  She recently got a reminder.  Can you find out about this?  Thanks.  MSM

## 2016-09-11 ENCOUNTER — Ambulatory Visit
Admission: RE | Admit: 2016-09-11 | Discharge: 2016-09-11 | Disposition: A | Discharge status: Home / Self Care | Payer: BLUE CROSS/BLUE SHIELD | Source: Ambulatory Visit | Attending: Certified Nurse Midwife | Admitting: Certified Nurse Midwife

## 2016-09-11 DIAGNOSIS — Z1231 Encounter for screening mammogram for malignant neoplasm of breast: Secondary | ICD-10-CM

## 2016-11-08 ENCOUNTER — Ambulatory Visit (INDEPENDENT_AMBULATORY_CARE_PROVIDER_SITE_OTHER): Payer: BLUE CROSS/BLUE SHIELD | Admitting: Family Medicine

## 2016-11-08 ENCOUNTER — Encounter: Payer: Self-pay | Admitting: Family Medicine

## 2016-11-08 VITALS — BP 148/78 | Temp 98.3°F | Ht 67.0 in | Wt 277.0 lb

## 2016-11-08 DIAGNOSIS — Z9109 Other allergy status, other than to drugs and biological substances: Secondary | ICD-10-CM

## 2016-11-08 MED ORDER — PANTOPRAZOLE SODIUM 40 MG PO TBEC: DELAYED_RELEASE_TABLET | ORAL | 3 refills | Status: DC

## 2016-11-08 MED ORDER — VERAPAMIL HCL 120 MG PO TABS: ORAL_TABLET | ORAL | 3 refills | Status: DC

## 2016-11-08 MED ORDER — VALSARTAN-HYDROCHLOROTHIAZIDE 160-25 MG PO TABS: ORAL_TABLET | ORAL | 3 refills | Status: DC

## 2016-11-08 NOTE — Progress Notes (Signed)
   Subjective:    Patient ID: Gabriela Shepherd, female    DOB: 1955-10-30, 61 y.o.   MRN: [id number]  HPI Here for 10 days of a mild ST. No fever or sinus congestion or cough. Using Flonase.    Review of Systems  Constitutional: Negative.   HENT: Positive for sore throat. Negative for congestion, ear pain, sinus pain and sinus pressure.   Eyes: Negative.   Respiratory: Negative.        Objective:   Physical Exam  Constitutional: She appears well-developed and well-nourished.  HENT:  Right Ear: External ear normal.  Left Ear: External ear normal.  Nose: Nose normal.  Mouth/Throat: Oropharynx is clear and moist. No oropharyngeal exudate.  Eyes: Conjunctivae are normal.  Neck: No thyromegaly present.  Pulmonary/Chest: Effort normal and breath sounds normal.  Lymphadenopathy:    She has no cervical adenopathy.          Assessment & Plan:  Probable allergies causing PND. She will try an antihistamine like Claritin daily.  Alysia Penna, MD

## 2016-11-08 NOTE — Progress Notes (Signed)
Pre visit review using our clinic review tool, if applicable. No additional management support is needed unless otherwise documented below in the visit note. 

## 2016-11-08 NOTE — Patient Instructions (Signed)
WE NOW OFFER   [facility] FAST TRACK!!!  SAME DAY Appointments for ACUTE CARE  Such as: Sprains, Injuries, cuts, abrasions, rashes, muscle pain, joint pain, back pain Colds, flu, sore throats, headache, allergies, cough, fever  Ear pain, sinus and eye infections Abdominal pain, nausea, vomiting, diarrhea, upset stomach Animal/insect bites  3 Easy Ways to Schedule: Walk-In Scheduling Call in scheduling Mychart Sign-up: [email or web address]         

## 2017-02-13 ENCOUNTER — Encounter: Payer: Self-pay | Admitting: Family Medicine

## 2017-02-15 ENCOUNTER — Other Ambulatory Visit: Payer: Self-pay | Admitting: Family Medicine

## 2017-02-15 MED ORDER — LOSARTAN POTASSIUM-HCTZ 50-12.5 MG PO TABS: 1.0000 | ORAL_TABLET | Freq: Every day | ORAL | 3 refills | Status: DC

## 2017-02-15 NOTE — Telephone Encounter (Signed)
Can we change the Valsartan?

## 2017-02-15 NOTE — Telephone Encounter (Signed)
Go ahead and cancel the Valsartan HCT. Call in Losartan HCT 50/12.5 to take daily, #90 with 3 rf

## 2017-02-22 ENCOUNTER — Encounter: Payer: Self-pay | Admitting: Family Medicine

## 2017-02-23 NOTE — Telephone Encounter (Signed)
The Valsartan HCT does contain a diuretic so stopping it could explain the fluid build up. See if it goes away over the next week or two, if not come see [place]

## 2017-04-12 ENCOUNTER — Encounter: Payer: Self-pay | Admitting: Family Medicine

## 2017-09-24 ENCOUNTER — Ambulatory Visit: Payer: BLUE CROSS/BLUE SHIELD | Admitting: Family Medicine

## 2017-09-24 ENCOUNTER — Encounter: Payer: Self-pay | Admitting: Family Medicine

## 2017-09-24 VITALS — BP 138/84 | HR 91 | Temp 97.9°F | Wt 283.0 lb

## 2017-09-24 DIAGNOSIS — K219 Gastro-esophageal reflux disease without esophagitis: Secondary | ICD-10-CM | POA: Diagnosis not present

## 2017-09-24 DIAGNOSIS — R600 Localized edema: Secondary | ICD-10-CM | POA: Diagnosis not present

## 2017-09-24 DIAGNOSIS — I10 Essential (primary) hypertension: Secondary | ICD-10-CM

## 2017-09-24 DIAGNOSIS — L989 Disorder of the skin and subcutaneous tissue, unspecified: Secondary | ICD-10-CM | POA: Diagnosis not present

## 2017-09-24 MED ORDER — PANTOPRAZOLE SODIUM 40 MG PO TBEC: DELAYED_RELEASE_TABLET | ORAL | 3 refills | Status: DC

## 2017-09-24 MED ORDER — VERAPAMIL HCL 120 MG PO TABS: ORAL_TABLET | ORAL | 3 refills | Status: DC

## 2017-09-24 MED ORDER — FUROSEMIDE 20 MG PO TABS: 20.0000 mg | ORAL_TABLET | Freq: Every day | ORAL | 3 refills | Status: DC

## 2017-09-24 MED ORDER — LOSARTAN POTASSIUM 50 MG PO TABS: 50.0000 mg | ORAL_TABLET | Freq: Every day | ORAL | 3 refills | Status: DC

## 2017-09-24 NOTE — Progress Notes (Signed)
   Subjective:    Patient ID: Gabriela Shepherd, female    DOB: 1955-12-23, 62 y.o.   MRN: [id number]  HPI Here to follow up on HTN and GERD. She also has some questions. She asks me to check a lesion on the right temple that appeared a year ago, and she asks me to check a lesion on the trunk that apeeared many years ago. Neither of these are changing over time. Her BP has been stable. She has developed some swelling in the lower legs and ankles however. This is not painful. No SOB.    Review of Systems  Constitutional: Negative.   Respiratory: Negative.   Cardiovascular: Positive for leg swelling. Negative for chest pain and palpitations.  Gastrointestinal: Negative.   Neurological: Negative.        Objective:   Physical Exam  Constitutional: She is oriented to person, place, and time. She appears well-developed and well-nourished.  Cardiovascular: Normal rate, regular rhythm, normal heart sounds and intact distal pulses.  Pulmonary/Chest: Effort normal and breath sounds normal. No respiratory distress. She has no wheezes. She has no rales.  Musculoskeletal:  2+ edema in both lower legs   Neurological: She is alert and oriented to person, place, and time.  Skin:  The lesion on the right temple is velvety in texture and light tan, consistent with a seborrheic keratosis. The lesion on the right flank is large, about 3 cm in diameter. This is red and pink, flat, and shiny smooth.           Assessment & Plan:  Her HTN is stable. She has developed leg edema, so we will stop the HCTZ and start her on Lasix 20 mg every morning. Her GERD is stable. We will refer her to Dermatology to evaluate the flank lesion. She will return in 2-3 weeks for a well exam and fasting labs. Alysia Penna, MD

## 2017-10-23 ENCOUNTER — Ambulatory Visit (INDEPENDENT_AMBULATORY_CARE_PROVIDER_SITE_OTHER): Payer: BLUE CROSS/BLUE SHIELD | Admitting: Family Medicine

## 2017-10-23 ENCOUNTER — Encounter: Payer: Self-pay | Admitting: Family Medicine

## 2017-10-23 VITALS — BP 130/78 | HR 91 | Temp 98.0°F | Ht 67.0 in | Wt 282.2 lb

## 2017-10-23 DIAGNOSIS — Z0000 Encounter for general adult medical examination without abnormal findings: Secondary | ICD-10-CM

## 2017-10-23 LAB — POC URINALSYSI DIPSTICK (AUTOMATED)
BILIRUBIN UA: NEGATIVE
Glucose, UA: NEGATIVE
KETONES UA: NEGATIVE
Leukocytes, UA: NEGATIVE
Nitrite, UA: NEGATIVE
PH UA: 6 (ref 5.0–8.0)
Spec Grav, UA: >=1.03 — AB (ref 1.010–1.025)
Urobilinogen, UA: 0.2 E.U./dL

## 2017-10-23 LAB — LIPID PANEL
CHOLESTEROL: 214 mg/dL — AB (ref 0–200)
HDL: 50.1 mg/dL (ref >39.00)
LDL Cholesterol: 143 mg/dL — ABNORMAL HIGH (ref 0–99)
NonHDL: 163.98
Total CHOL/HDL Ratio: 4
Triglycerides: 104 mg/dL (ref 0.0–149.0)
VLDL: 20.8 mg/dL (ref 0.0–40.0)

## 2017-10-23 LAB — HEPATIC FUNCTION PANEL
ALBUMIN: 4.1 g/dL (ref 3.5–5.2)
ALT: 25 U/L (ref 0–35)
AST: 23 U/L (ref 0–37)
Alkaline Phosphatase: 92 U/L (ref 39–117)
Bilirubin, Direct: 0.1 mg/dL (ref 0.0–0.3)
Total Bilirubin: 0.4 mg/dL (ref 0.2–1.2)
Total Protein: 7.3 g/dL (ref 6.0–8.3)

## 2017-10-23 LAB — CBC WITH DIFFERENTIAL/PLATELET
BASOS ABS: 0 10*3/uL (ref 0.0–0.1)
Basophils Relative: 0.6 % (ref 0.0–3.0)
EOS ABS: 0.1 10*3/uL (ref 0.0–0.7)
Eosinophils Relative: 1.2 % (ref 0.0–5.0)
HCT: 42.2 % (ref 36.0–46.0)
Hemoglobin: 14.5 g/dL (ref 12.0–15.0)
LYMPHS ABS: 2 10*3/uL (ref 0.7–4.0)
Lymphocytes Relative: 26.2 % (ref 12.0–46.0)
MCHC: 34.3 g/dL (ref 30.0–36.0)
MCV: 85.9 fl (ref 78.0–100.0)
Monocytes Absolute: 0.4 10*3/uL (ref 0.1–1.0)
Monocytes Relative: 4.9 % (ref 3.0–12.0)
NEUTROS ABS: 5 10*3/uL (ref 1.4–7.7)
NEUTROS PCT: 67.1 % (ref 43.0–77.0)
PLATELETS: 248 10*3/uL (ref 150.0–400.0)
RBC: 4.91 Mil/uL (ref 3.87–5.11)
RDW: 14.2 % (ref 11.5–15.5)
WBC: 7.4 10*3/uL (ref 4.0–10.5)

## 2017-10-23 LAB — BASIC METABOLIC PANEL
BUN: 22 mg/dL (ref 6–23)
CHLORIDE: 104 meq/L (ref 96–112)
CO2: 28 mEq/L (ref 19–32)
Calcium: 9.3 mg/dL (ref 8.4–10.5)
Creatinine, Ser: 0.67 mg/dL (ref 0.40–1.20)
GFR: 95.37 mL/min (ref >60.00)
GLUCOSE: 106 mg/dL — AB (ref 70–99)
POTASSIUM: 4.3 meq/L (ref 3.5–5.1)
Sodium: 141 mEq/L (ref 135–145)

## 2017-10-23 LAB — TSH: TSH: 4.9 u[IU]/mL — AB (ref 0.35–4.50)

## 2017-10-23 NOTE — Progress Notes (Signed)
   Subjective:    Patient ID: Gabriela Shepherd, female    DOB: 1955/09/25, 62 y.o.   MRN: [id number]  HPI Here tor a well exam. She feels well in general. A few weeks ago she was here for leg swelling and we switched her from HCTZ to Lasix. This has worked well and the swelling is down.    Review of Systems  Constitutional: Negative.   HENT: Negative.   Eyes: Negative.   Respiratory: Negative.   Cardiovascular: Positive for leg swelling. Negative for chest pain and palpitations.  Gastrointestinal: Negative.   Genitourinary: Negative for decreased urine volume, difficulty urinating, dyspareunia, dysuria, enuresis, flank pain, frequency, hematuria, pelvic pain and urgency.  Musculoskeletal: Negative.   Skin: Negative.   Neurological: Negative.   Psychiatric/Behavioral: Negative.        Objective:   Physical Exam  Constitutional: She is oriented to person, place, and time. She appears well-developed and well-nourished. No distress.  HENT:  Head: Normocephalic and atraumatic.  Right Ear: External ear normal.  Left Ear: External ear normal.  Nose: Nose normal.  Mouth/Throat: Oropharynx is clear and moist. No oropharyngeal exudate.  Eyes: Pupils are equal, round, and reactive to light. Conjunctivae and EOM are normal. No scleral icterus.  Neck: Normal range of motion. Neck supple. No JVD present. No thyromegaly present.  Cardiovascular: Normal rate, regular rhythm, normal heart sounds and intact distal pulses. Exam reveals no gallop and no friction rub.  No murmur heard. Pulmonary/Chest: Effort normal and breath sounds normal. No respiratory distress. She has no wheezes. She has no rales. She exhibits no tenderness.  Abdominal: Soft. Bowel sounds are normal. She exhibits no distension and no mass. There is no tenderness. There is no rebound and no guarding.  Musculoskeletal: Normal range of motion. She exhibits no tenderness.  Trace edema in both lower legs   Lymphadenopathy:   She has no cervical adenopathy.  Neurological: She is alert and oriented to person, place, and time. She has normal reflexes. No cranial nerve deficit. She exhibits normal muscle tone. Coordination normal.  Skin: Skin is warm and dry. No rash noted. No erythema.  Psychiatric: She has a normal mood and affect. Her behavior is normal. Judgment and thought content normal.          Assessment & Plan:  Well exam. We discussed diet and exercise. Get fasting labs. She is past due for a colonoscopy so we will arrange for this.  Alysia Penna, MD

## 2017-10-26 ENCOUNTER — Encounter: Payer: Self-pay | Admitting: Gastroenterology

## 2017-10-26 ENCOUNTER — Other Ambulatory Visit: Payer: Self-pay

## 2017-10-26 MED ORDER — LEVOTHYROXINE SODIUM 50 MCG PO TABS: 50.0000 ug | ORAL_TABLET | Freq: Every day | ORAL | 3 refills | Status: DC

## 2017-11-20 ENCOUNTER — Other Ambulatory Visit: Payer: Self-pay | Admitting: Certified Nurse Midwife

## 2017-11-20 DIAGNOSIS — Z1231 Encounter for screening mammogram for malignant neoplasm of breast: Secondary | ICD-10-CM

## 2017-11-22 ENCOUNTER — Ambulatory Visit (INDEPENDENT_AMBULATORY_CARE_PROVIDER_SITE_OTHER): Payer: BLUE CROSS/BLUE SHIELD | Admitting: Obstetrics & Gynecology

## 2017-11-22 ENCOUNTER — Encounter: Payer: Self-pay | Admitting: Obstetrics & Gynecology

## 2017-11-22 ENCOUNTER — Other Ambulatory Visit: Payer: Self-pay

## 2017-11-22 VITALS — BP 142/80 | HR 70 | Resp 16 | Ht 67.0 in | Wt 283.0 lb

## 2017-11-22 DIAGNOSIS — Z01419 Encounter for gynecological examination (general) (routine) without abnormal findings: Secondary | ICD-10-CM

## 2017-11-22 NOTE — Progress Notes (Signed)
62 y.o. [id number] SingleCaucasianF here for annual exam.  Doing well.  Denies vaginal bleeding.    Patient's last menstrual period was 11/21/2009.          Sexually active: No.  The current method of family planning is status post hysterectomy.    Exercising: No.  no Smoker:  no  Health Maintenance: Pap:  12/10/14 Neg. HR HPV:neg   12/04/13 Neg  History of abnormal Pap:  yes MMG:  09/12/16 BIRADS1:Neg. Has appt tomorrow  Colonoscopy:  11/27/08 polyps. F/u 10 years --Scheduled 12/2017.  This is scheduled with [facility] BMD:   n/a TDaP:  2009?Marland Kitchen  Aware this is due.  Declines today.   Pneumonia vaccine(s):  n/a Shingrix:   N/a.  Reviewed Shingrix vaccination with pt today.   Hep C testing: Donates blood.  Has given over a gallon. Screening Labs: PCP, Hb today: PCP   reports that she has never smoked. She has never used smokeless tobacco. She reports that she drank alcohol. She reports that she does not use drugs.  Past Medical History:  Diagnosis Date  . Allergic rhinitis    sees Dr. Annamaria Boots  . Anxiety   . Asthma    sees Dr. Annamaria Boots  . Carpal tunnel syndrome    sees Dr. Amedeo Plenty  . GERD (gastroesophageal reflux disease)   . Gynecological examination    sees Dr. Elyse Hsu  . Headache(784.0)    hx of pt states BP meds has improved   . Hypertension   . Osteoarthritis     Past Surgical History:  Procedure Laterality Date  . BUNIONECTOMY    . CARPAL TUNNEL RELEASE    . CERVICAL POLYPECTOMY N/A 04/05/2015   Procedure: CERVICAL POLYPECTOMY removal ;  Surgeon: Megan Salon, MD;  Location: [facility];  Service: Gynecology;  Laterality: N/A;  . COLONOSCOPY  11-27-08   per Dr. Olevia Perches, hyperplastic polyps, repeat in 7 yrs   . DILATATION & CURRETTAGE/HYSTEROSCOPY WITH RESECTOCOPE N/A 04/05/2015   Procedure: DILATATION & CURETTAGE/HYSTEROSCOPY ;  Surgeon: Megan Salon, MD;  Location: [facility];  Service: Gynecology;  Laterality: N/A;  . DILATION AND CURETTAGE OF UTERUS    . hysteroscopic polyp  resection    . KNEE ARTHROSCOPY    . REFRACTIVE SURGERY     related to hole in back of left eye as stated per pt   . ROBOTIC ASSISTED TOTAL HYSTERECTOMY WITH BILATERAL SALPINGO OOPHERECTOMY Bilateral 05/13/2015   Procedure: XI ROBOTIC ASSISTED TOTAL HYSTERECTOMY WITH BILATERAL SALPINGO OOPHORECTOMY;  Surgeon: Janie Morning, MD;  Location: [facility];  Service: Gynecology;  Laterality: Bilateral;  . TOTAL KNEE ARTHROPLASTY Left 05/04/2014   Procedure: LEFT TOTAL KNEE ARTHROPLASTY;  Surgeon: Mauri Pole, MD;  Location: [facility];  Service: Orthopedics;  Laterality: Left;  . TUBAL LIGATION      Current Outpatient Medications  Medication Sig Dispense Refill  . furosemide (LASIX) 20 MG tablet Take 1 tablet (20 mg total) by mouth daily. 90 tablet 3  . levothyroxine (SYNTHROID, LEVOTHROID) 50 MCG tablet Take 1 tablet (50 mcg total) by mouth daily. 90 tablet 3  . losartan (COZAAR) 50 MG tablet Take 1 tablet (50 mg total) by mouth daily. 90 tablet 3  . Multiple Vitamin (MULTIVITAMIN) tablet Take 1 tablet by mouth every morning.     . naproxen sodium (ANAPROX) 220 MG tablet Take 440 mg by mouth 2 (two) times daily as needed (pain).    . pantoprazole (PROTONIX) 40 MG tablet TAKE 1 BY MOUTH EVERY  MORNING 90 tablet 3  . verapamil (CALAN) 120 MG tablet TAKE 1 BY MOUTH DAILY 90 tablet 3   No current facility-administered medications for this visit.     Family History  Problem Relation Age of Onset  . Hypertension Mother   . Liver cancer Mother   . Heart disease Father   . Hypertension Father   . Diabetes Maternal Grandmother   . Cancer Maternal Aunt   . Breast cancer Neg Hx     Review of Systems  Constitutional: Negative.   HENT: Negative.   Eyes: Negative.   Respiratory: Negative.   Cardiovascular: Positive for leg swelling.  Gastrointestinal: Negative.   Genitourinary: Negative.   Musculoskeletal: Negative.   Skin: Negative.   Neurological: Negative.   Endo/Heme/Allergies: Negative.    Psychiatric/Behavioral: Negative.     Exam:   BP (!) 142/80 (BP Location: Right Arm, Patient Position: Sitting, Cuff Size: Large)   Pulse 70   Resp 16   Ht 5\' 7"  (1.702 m)   Wt 283 lb (128.4 kg)   LMP 11/21/2009   BMI 44.32 kg/m   Height: 5\' 7"  (170.2 cm)  Ht Readings from Last 3 Encounters:  11/22/17 5\' 7"  (1.702 m)  10/23/17 5\' 7"  (1.702 m)  11/08/16 5\' 7"  (1.702 m)    General appearance: alert, cooperative and appears stated age Head: Normocephalic, without obvious abnormality, atraumatic Neck: no adenopathy, supple, symmetrical, trachea midline and thyroid normal to inspection and palpation Lungs: clear to auscultation bilaterally Breasts: normal appearance, no masses or tenderness Heart: regular rate and rhythm Abdomen: soft, non-tender; bowel sounds normal; no masses,  no organomegaly Extremities: extremities normal, atraumatic, no cyanosis or edema Skin: Skin color, texture, turgor normal. No rashes or lesions Lymph nodes: Cervical, supraclavicular, and axillary nodes normal. No abnormal inguinal nodes palpated Neurologic: Grossly normal   Pelvic: External genitalia:  no lesions              Urethra:  normal appearing urethra with no masses, tenderness or lesions              Bartholins and Skenes: normal                 Vagina: normal appearing vagina with normal color and discharge, no lesions              Cervix: absent              Pap taken: No. Bimanual Exam:  Uterus:  uterus absent              Adnexa: no mass, fullness, tenderness               Rectovaginal: Confirms               Anus:  normal sphincter tone, no lesions  Chaperone was present for exam.  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 Denman George Hypertension LE edema, on lasix Hypothyroidism  P:   Mammogram guidelines reviewed.  Appt is scheduled Tdap due.  Declines today.  D/w pt importance if has  exposure. pap smear not indicated Colonoscopy is scheduled for 6/19 Lab work UTD.  Has appt for follow up thyroid testing. return annually or prn

## 2017-11-23 ENCOUNTER — Ambulatory Visit
Admission: RE | Admit: 2017-11-23 | Discharge: 2017-11-23 | Disposition: A | Discharge status: Home / Self Care | Payer: BLUE CROSS/BLUE SHIELD | Source: Ambulatory Visit | Attending: Certified Nurse Midwife | Admitting: Certified Nurse Midwife

## 2017-11-23 DIAGNOSIS — Z1231 Encounter for screening mammogram for malignant neoplasm of breast: Secondary | ICD-10-CM

## 2017-12-21 ENCOUNTER — Other Ambulatory Visit: Payer: Self-pay

## 2017-12-21 ENCOUNTER — Ambulatory Visit (AMBULATORY_SURGERY_CENTER): Payer: Self-pay | Admitting: *Deleted

## 2017-12-21 VITALS — Ht 68.0 in | Wt 287.0 lb

## 2017-12-21 DIAGNOSIS — Z1211 Encounter for screening for malignant neoplasm of colon: Secondary | ICD-10-CM

## 2017-12-21 MED ORDER — NA SULFATE-K SULFATE-MG SULF 17.5-3.13-1.6 GM/177ML PO SOLN: ORAL | 0 refills | Status: DC

## 2017-12-21 NOTE — Progress Notes (Signed)
Patient denies any allergies to eggs or soy. Patient denies any problems with anesthesia/sedation. Patient denies any oxygen use at home. Patient denies taking any diet/weight loss medications or blood thinners. EMMI education assisgned to patient on colonoscopy, this was explained and instructions given to patient. Suprep coupon given to pt.  

## 2017-12-31 ENCOUNTER — Encounter: Payer: Self-pay | Admitting: Gastroenterology

## 2018-01-04 ENCOUNTER — Other Ambulatory Visit: Payer: Self-pay

## 2018-01-04 ENCOUNTER — Ambulatory Visit (AMBULATORY_SURGERY_CENTER): Payer: BLUE CROSS/BLUE SHIELD | Admitting: Gastroenterology

## 2018-01-04 ENCOUNTER — Encounter: Payer: Self-pay | Admitting: Gastroenterology

## 2018-01-04 VITALS — BP 99/55 | HR 66 | Temp 97.8°F | Resp 15 | Ht 67.0 in | Wt 283.0 lb

## 2018-01-04 DIAGNOSIS — D125 Benign neoplasm of sigmoid colon: Secondary | ICD-10-CM

## 2018-01-04 DIAGNOSIS — K621 Rectal polyp: Secondary | ICD-10-CM

## 2018-01-04 DIAGNOSIS — D128 Benign neoplasm of rectum: Secondary | ICD-10-CM

## 2018-01-04 DIAGNOSIS — D122 Benign neoplasm of ascending colon: Secondary | ICD-10-CM | POA: Diagnosis not present

## 2018-01-04 DIAGNOSIS — Z1211 Encounter for screening for malignant neoplasm of colon: Secondary | ICD-10-CM

## 2018-01-04 DIAGNOSIS — D127 Benign neoplasm of rectosigmoid junction: Secondary | ICD-10-CM | POA: Diagnosis not present

## 2018-01-04 DIAGNOSIS — Z86010 Personal history of colonic polyps: Secondary | ICD-10-CM | POA: Diagnosis not present

## 2018-01-04 HISTORY — PX: COLONOSCOPY: SHX174

## 2018-01-04 MED ORDER — SODIUM CHLORIDE 0.9 % IV SOLN: 500.0000 mL | INTRAVENOUS | Status: DC

## 2018-01-04 NOTE — Patient Instructions (Signed)
*   Handouts given on polyps, diverticulosis and hemorrhoids  YOU HAD AN ENDOSCOPIC PROCEDURE TODAY AT THE [facility]:   Refer to the procedure report that was given to you for any specific questions about what was found during the examination.  If the procedure report does not answer your questions, please call your gastroenterologist to clarify.  If you requested that your care partner not be given the details of your procedure findings, then the procedure report has been included in a sealed envelope for you to review at your convenience later.  YOU SHOULD EXPECT: Some feelings of bloating in the abdomen. Passage of more gas than usual.  Walking can help get rid of the air that was put into your GI tract during the procedure and reduce the bloating. If you had a lower endoscopy (such as a colonoscopy or flexible sigmoidoscopy) you may notice spotting of blood in your stool or on the toilet paper. If you underwent a bowel prep for your procedure, you may not have a normal bowel movement for a few days.  Please Note:  You might notice some irritation and congestion in your nose or some drainage.  This is from the oxygen used during your procedure.  There is no need for concern and it should clear up in a day or so.  SYMPTOMS TO REPORT IMMEDIATELY:   Following lower endoscopy (colonoscopy or flexible sigmoidoscopy):  Excessive amounts of blood in the stool  Significant tenderness or worsening of abdominal pains  Swelling of the abdomen that is new, acute  Fever of 100F or higher   For urgent or emergent issues, a gastroenterologist can be reached at any hour by calling [phone].   DIET:  We do recommend a small meal at first, but then you may proceed to your regular diet.  Drink plenty of fluids but you should avoid alcoholic beverages for 24 hours.  ACTIVITY:  You should plan to take it easy for the rest of today and you should NOT DRIVE or use heavy machinery until tomorrow  (because of the sedation medicines used during the test).    FOLLOW UP: Our staff will call the number listed on your records the next business day following your procedure to check on you and address any questions or concerns that you may have regarding the information given to you following your procedure. If we do not reach you, we will leave a message.  However, if you are feeling well and you are not experiencing any problems, there is no need to return our call.  We will assume that you have returned to your regular daily activities without incident.  If any biopsies were taken you will be contacted by phone or by letter within the next 1-3 weeks.  Please call us at [phone] if you have not heard about the biopsies in 3 weeks.    SIGNATURES/CONFIDENTIALITY: You and/or your care partner have signed paperwork which will be entered into your electronic medical record.  These signatures attest to the fact that that the information above on your After Visit Summary has been reviewed and is understood.  Full responsibility of the confidentiality of this discharge information lies with you and/or your care-partner. 

## 2018-01-04 NOTE — Progress Notes (Signed)
Called to room to assist during endoscopic procedure.  Patient ID and intended procedure confirmed with present staff. Received instructions for my participation in the procedure from the performing physician.  

## 2018-01-04 NOTE — Progress Notes (Signed)
Pt's states no medical or surgical changes since previsit or office visit. 

## 2018-01-04 NOTE — Op Note (Signed)
[place] Patient Name: Gabriela Shepherd Procedure Date: 01/04/2018 8:41 AM MRN: [id number] Endoscopist: Mauri Pole , MD Age: 62 Referring MD:  Date of Birth: [birth year] Gender: Female Account #: [id number] Procedure:                Colonoscopy Indications:              Screening for colorectal malignant neoplasm, Last                            colonoscopy 10 years ago Medicines:                Monitored Anesthesia Care Procedure:                Pre-Anesthesia Assessment:                           - Prior to the procedure, a History and Physical                            was performed, and patient medications and                            allergies were reviewed. The patient's tolerance of                            previous anesthesia was also reviewed. The risks                            and benefits of the procedure and the sedation                            options and risks were discussed with the patient.                            All questions were answered, and informed consent                            was obtained. Prior Anticoagulants: The patient has                            taken no previous anticoagulant or antiplatelet                            agents. ASA Grade Assessment: III - A patient with                            severe systemic disease. After reviewing the risks                            and benefits, the patient was deemed in                            satisfactory condition to undergo the procedure.  After obtaining informed consent, the colonoscope                            was passed under direct vision. Throughout the                            procedure, the patient's blood pressure, pulse, and                            oxygen saturations were monitored continuously. The                            Colonoscope was introduced through the anus and                            advanced to the the  terminal ileum, with                            identification of the appendiceal orifice and IC                            valve. The colonoscopy was performed without                            difficulty. The patient tolerated the procedure                            well. The quality of the bowel preparation was                            adequate. The terminal ileum, ileocecal valve,                            appendiceal orifice, and rectum were photographed. Scope In: 8:44:01 AM Scope Out: 8:59:34 AM Scope Withdrawal Time: 0 hours 11 minutes 17 seconds  Total Procedure Duration: 0 hours 15 minutes 33 seconds  Findings:                 The perianal and digital rectal examinations were                            normal.                           A 7 mm polyp was found in the sigmoid colon. The                            polyp was sessile. The polyp was removed with a                            cold snare. Resection and retrieval were complete.                           Two sessile polyps were found in the rectum and  ascending colon. The polyps were 1 to 2 mm in size.                            These polyps were removed with a cold biopsy                            forceps. Resection and retrieval were complete.                           Multiple small and large-mouthed diverticula were                            found in the sigmoid colon, descending colon,                            transverse colon and ascending colon. There was                            evidence of an impacted diverticulum.                           Non-bleeding internal hemorrhoids were found during                            retroflexion. The hemorrhoids were large. Complications:            No immediate complications. Estimated Blood Loss:     Estimated blood loss was minimal. Impression:               - One 7 mm polyp in the sigmoid colon, removed with                            a  cold snare. Resected and retrieved.                           - Two 1 to 2 mm polyps in the rectum and in the                            ascending colon, removed with a cold biopsy                            forceps. Resected and retrieved.                           - Moderate diverticulosis in the sigmoid colon, in                            the descending colon, in the transverse colon and                            in the ascending colon. There was evidence of an                            impacted diverticulum.                           -  Non-bleeding internal hemorrhoids. Recommendation:           - Patient has a contact number available for                            emergencies. The signs and symptoms of potential                            delayed complications were discussed with the                            patient. Return to normal activities tomorrow.                            Written discharge instructions were provided to the                            patient.                           - Resume previous diet.                           - Continue present medications.                           - Await pathology results.                           - Repeat colonoscopy in 5-10 years for surveillance                            based on pathology results. Mauri Pole, MD 01/04/2018 9:05:08 AM This report has been signed electronically.

## 2018-01-04 NOTE — Progress Notes (Signed)
Report given to PACU, vss 

## 2018-01-07 ENCOUNTER — Telehealth: Payer: Self-pay

## 2018-01-07 NOTE — Telephone Encounter (Signed)
Called pt for follow up in error.

## 2018-01-07 NOTE — Telephone Encounter (Signed)
  Follow up Call-  Call back number 01/04/2018  Post procedure Call Back phone  # [phone]  Permission to leave phone message Yes  Some recent data might be hidden     Patient questions:  Do you have a fever, pain , or abdominal swelling? No. Pain Score  0 *  Have you tolerated food without any problems? Yes.    Have you been able to return to your normal activities? Yes.    Do you have any questions about your discharge instructions: Diet   No. Medications  No. Follow up visit  No.  Do you have questions or concerns about your Care? No.  Actions: * If pain score is 4 or above: No action needed, pain <4.

## 2018-01-12 ENCOUNTER — Encounter: Payer: Self-pay | Admitting: Gastroenterology

## 2018-01-21 ENCOUNTER — Other Ambulatory Visit: Payer: Self-pay | Admitting: Family Medicine

## 2018-01-28 ENCOUNTER — Other Ambulatory Visit (INDEPENDENT_AMBULATORY_CARE_PROVIDER_SITE_OTHER): Payer: BLUE CROSS/BLUE SHIELD

## 2018-01-28 ENCOUNTER — Other Ambulatory Visit: Payer: Self-pay

## 2018-01-28 DIAGNOSIS — R7989 Other specified abnormal findings of blood chemistry: Secondary | ICD-10-CM

## 2018-01-28 LAB — TSH: TSH: 3.02 u[IU]/mL (ref 0.35–4.50)

## 2018-01-28 LAB — T4, FREE: Free T4: 0.94 ng/dL (ref 0.60–1.60)

## 2018-01-28 LAB — T3, FREE: T3 FREE: 3.7 pg/mL (ref 2.3–4.2)

## 2018-01-29 ENCOUNTER — Encounter: Payer: Self-pay | Admitting: Family Medicine

## 2018-05-28 DIAGNOSIS — D225 Melanocytic nevi of trunk: Secondary | ICD-10-CM | POA: Diagnosis not present

## 2018-05-28 DIAGNOSIS — C44519 Basal cell carcinoma of skin of other part of trunk: Secondary | ICD-10-CM | POA: Diagnosis not present

## 2018-05-28 DIAGNOSIS — D1801 Hemangioma of skin and subcutaneous tissue: Secondary | ICD-10-CM | POA: Diagnosis not present

## 2018-05-28 DIAGNOSIS — L821 Other seborrheic keratosis: Secondary | ICD-10-CM | POA: Diagnosis not present

## 2018-05-28 DIAGNOSIS — D485 Neoplasm of uncertain behavior of skin: Secondary | ICD-10-CM | POA: Diagnosis not present

## 2018-07-10 ENCOUNTER — Other Ambulatory Visit: Payer: Self-pay | Admitting: Family Medicine

## 2018-07-24 DIAGNOSIS — C4491 Basal cell carcinoma of skin, unspecified: Secondary | ICD-10-CM

## 2018-07-24 HISTORY — PX: BASAL CELL CARCINOMA EXCISION: SHX1214

## 2018-07-24 HISTORY — DX: Basal cell carcinoma of skin, unspecified: C44.91

## 2018-07-24 HISTORY — PX: MOHS SURGERY: SUR867

## 2018-08-06 DIAGNOSIS — C44519 Basal cell carcinoma of skin of other part of trunk: Secondary | ICD-10-CM | POA: Diagnosis not present

## 2018-08-19 DIAGNOSIS — M2021 Hallux rigidus, right foot: Secondary | ICD-10-CM | POA: Diagnosis not present

## 2018-08-19 DIAGNOSIS — M79671 Pain in right foot: Secondary | ICD-10-CM | POA: Diagnosis not present

## 2018-08-19 DIAGNOSIS — M2022 Hallux rigidus, left foot: Secondary | ICD-10-CM | POA: Diagnosis not present

## 2018-08-19 DIAGNOSIS — M79672 Pain in left foot: Secondary | ICD-10-CM | POA: Diagnosis not present

## 2018-09-09 ENCOUNTER — Other Ambulatory Visit: Payer: Self-pay | Admitting: Family Medicine

## 2018-10-12 ENCOUNTER — Other Ambulatory Visit: Payer: Self-pay | Admitting: Family Medicine

## 2018-10-16 ENCOUNTER — Telehealth: Payer: Self-pay

## 2018-10-16 NOTE — Telephone Encounter (Signed)
Author phoned pt. to reschedule CPE with Dr. Sarajane Jews for 3 months out. No answer. Author left detailed VM asking for return call to reschedule.

## 2018-10-29 ENCOUNTER — Encounter: Payer: BLUE CROSS/BLUE SHIELD | Admitting: Family Medicine

## 2018-12-08 ENCOUNTER — Other Ambulatory Visit: Payer: Self-pay | Admitting: Family Medicine

## 2018-12-30 ENCOUNTER — Ambulatory Visit (INDEPENDENT_AMBULATORY_CARE_PROVIDER_SITE_OTHER): Payer: BC Managed Care – PPO | Admitting: Family Medicine

## 2018-12-30 ENCOUNTER — Encounter: Payer: Self-pay | Admitting: Family Medicine

## 2018-12-30 ENCOUNTER — Other Ambulatory Visit: Payer: Self-pay

## 2018-12-30 VITALS — BP 140/80 | HR 80 | Temp 98.8°F | Ht 68.0 in | Wt 287.5 lb

## 2018-12-30 DIAGNOSIS — Z0000 Encounter for general adult medical examination without abnormal findings: Secondary | ICD-10-CM

## 2018-12-30 DIAGNOSIS — E039 Hypothyroidism, unspecified: Secondary | ICD-10-CM | POA: Insufficient documentation

## 2018-12-30 LAB — CBC WITH DIFFERENTIAL/PLATELET
Basophils Absolute: 0.1 10*3/uL (ref 0.0–0.1)
Basophils Relative: 0.7 % (ref 0.0–3.0)
Eosinophils Absolute: 0.1 10*3/uL (ref 0.0–0.7)
Eosinophils Relative: 1 % (ref 0.0–5.0)
HCT: 41.4 % (ref 36.0–46.0)
Hemoglobin: 14 g/dL (ref 12.0–15.0)
Lymphocytes Relative: 25.3 % (ref 12.0–46.0)
Lymphs Abs: 1.8 10*3/uL (ref 0.7–4.0)
MCHC: 33.9 g/dL (ref 30.0–36.0)
MCV: 86.1 fl (ref 78.0–100.0)
Monocytes Absolute: 0.4 10*3/uL (ref 0.1–1.0)
Monocytes Relative: 5.8 % (ref 3.0–12.0)
Neutro Abs: 4.8 10*3/uL (ref 1.4–7.7)
Neutrophils Relative %: 67.2 % (ref 43.0–77.0)
Platelets: 227 10*3/uL (ref 150.0–400.0)
RBC: 4.81 Mil/uL (ref 3.87–5.11)
RDW: 14.3 % (ref 11.5–15.5)
WBC: 7.1 10*3/uL (ref 4.0–10.5)

## 2018-12-30 LAB — POC URINALSYSI DIPSTICK (AUTOMATED)
Bilirubin, UA: NEGATIVE
Blood, UA: NEGATIVE
Glucose, UA: NEGATIVE
Ketones, UA: POSITIVE
Leukocytes, UA: NEGATIVE
Nitrite, UA: NEGATIVE
Protein, UA: POSITIVE — AB
Spec Grav, UA: 1.025 (ref 1.010–1.025)
Urobilinogen, UA: 0.2 E.U./dL
pH, UA: 6 (ref 5.0–8.0)

## 2018-12-30 LAB — TSH: TSH: 2.26 u[IU]/mL (ref 0.35–4.50)

## 2018-12-30 LAB — LIPID PANEL
Cholesterol: 226 mg/dL — ABNORMAL HIGH (ref 0–200)
HDL: 47 mg/dL (ref >39.00)
LDL Cholesterol: 144 mg/dL — ABNORMAL HIGH (ref 0–99)
NonHDL: 179.02
Total CHOL/HDL Ratio: 5
Triglycerides: 173 mg/dL — ABNORMAL HIGH (ref 0.0–149.0)
VLDL: 34.6 mg/dL (ref 0.0–40.0)

## 2018-12-30 LAB — HEPATIC FUNCTION PANEL
ALT: 27 U/L (ref 0–35)
AST: 22 U/L (ref 0–37)
Albumin: 4.3 g/dL (ref 3.5–5.2)
Alkaline Phosphatase: 89 U/L (ref 39–117)
Bilirubin, Direct: 0.1 mg/dL (ref 0.0–0.3)
Total Bilirubin: 0.5 mg/dL (ref 0.2–1.2)
Total Protein: 7.1 g/dL (ref 6.0–8.3)

## 2018-12-30 LAB — BASIC METABOLIC PANEL
BUN: 20 mg/dL (ref 6–23)
CO2: 30 mEq/L (ref 19–32)
Calcium: 9.6 mg/dL (ref 8.4–10.5)
Chloride: 103 mEq/L (ref 96–112)
Creatinine, Ser: 0.7 mg/dL (ref 0.40–1.20)
GFR: 84.97 mL/min (ref >60.00)
Glucose, Bld: 100 mg/dL — ABNORMAL HIGH (ref 70–99)
Potassium: 4.2 mEq/L (ref 3.5–5.1)
Sodium: 141 mEq/L (ref 135–145)

## 2018-12-30 LAB — T4, FREE: Free T4: 0.9 ng/dL (ref 0.60–1.60)

## 2018-12-30 LAB — T3, FREE: T3, Free: 3.3 pg/mL (ref 2.3–4.2)

## 2018-12-30 MED ORDER — VERAPAMIL HCL 120 MG PO TABS: ORAL_TABLET | ORAL | 3 refills | Status: DC

## 2018-12-30 MED ORDER — LOSARTAN POTASSIUM-HCTZ 50-12.5 MG PO TABS: 1.0000 | ORAL_TABLET | Freq: Every day | ORAL | 3 refills | Status: DC

## 2018-12-30 MED ORDER — PANTOPRAZOLE SODIUM 40 MG PO TBEC: DELAYED_RELEASE_TABLET | ORAL | 3 refills | Status: DC

## 2018-12-30 NOTE — Progress Notes (Signed)
   Subjective:    Patient ID: Gabriela Shepherd, female    DOB: 22-Sep-1955, 63 y.o.   MRN: [id number]  HPI Here for a well exam. She has had intermittent sharp pains in the anterior left shoulder for 2 months. No hx of trauma. Also she wants to come off Lasix because it makes her have to urinate so much. Her BP has been stable and her ankles have not been swelling.    Review of Systems  Constitutional: Negative.   HENT: Negative.   Eyes: Negative.   Respiratory: Negative.   Cardiovascular: Negative.   Gastrointestinal: Negative.   Genitourinary: Negative for decreased urine volume, difficulty urinating, dyspareunia, dysuria, enuresis, flank pain, frequency, hematuria, pelvic pain and urgency.  Musculoskeletal: Positive for arthralgias.  Skin: Negative.   Neurological: Negative.   Psychiatric/Behavioral: Negative.        Objective:   Physical Exam Constitutional:      General: She is not in acute distress.    Appearance: She is well-developed.  HENT:     Head: Normocephalic and atraumatic.     Right Ear: External ear normal.     Left Ear: External ear normal.     Nose: Nose normal.     Mouth/Throat:     Pharynx: No oropharyngeal exudate.  Eyes:     General: No scleral icterus.    Conjunctiva/sclera: Conjunctivae normal.     Pupils: Pupils are equal, round, and reactive to light.  Neck:     Musculoskeletal: Normal range of motion and neck supple.     Thyroid: No thyromegaly.     Vascular: No JVD.  Cardiovascular:     Rate and Rhythm: Normal rate and regular rhythm.     Heart sounds: Normal heart sounds. No murmur. No friction rub. No gallop.   Pulmonary:     Effort: Pulmonary effort is normal. No respiratory distress.     Breath sounds: Normal breath sounds. No wheezing or rales.  Chest:     Chest wall: No tenderness.  Abdominal:     General: Bowel sounds are normal. There is no distension.     Palpations: Abdomen is soft. There is no mass.     Tenderness: There is  no abdominal tenderness. There is no guarding or rebound.  Musculoskeletal: Normal range of motion.     Comments: She is tender in the subacromial space of the left shoulder, no crepitus, full ROM  Lymphadenopathy:     Cervical: No cervical adenopathy.  Skin:    General: Skin is warm and dry.     Findings: No erythema or rash.  Neurological:     Mental Status: She is alert and oriented to person, place, and time.     Cranial Nerves: No cranial nerve deficit.     Motor: No abnormal muscle tone.     Coordination: Coordination normal.     Deep Tendon Reflexes: Reflexes are normal and symmetric. Reflexes normal.  Psychiatric:        Behavior: Behavior normal.        Thought Content: Thought content normal.        Judgment: Judgment normal.           Assessment & Plan:  Well exam. We discussed diet and exercise. Get fasting labs. Stop Lasix and add HCTZ to the Losartan. She has some bursitis in the left shoulder so she can use ice and Ibuprofen. Recheck prn.  Alysia Penna, MD

## 2018-12-31 ENCOUNTER — Other Ambulatory Visit: Payer: Self-pay | Admitting: Certified Nurse Midwife

## 2018-12-31 ENCOUNTER — Encounter: Payer: Self-pay | Admitting: *Deleted

## 2018-12-31 DIAGNOSIS — Z1231 Encounter for screening mammogram for malignant neoplasm of breast: Secondary | ICD-10-CM

## 2019-01-04 ENCOUNTER — Other Ambulatory Visit: Payer: Self-pay

## 2019-01-04 ENCOUNTER — Ambulatory Visit
Admission: RE | Admit: 2019-01-04 | Discharge: 2019-01-04 | Disposition: A | Discharge status: Home / Self Care | Payer: BC Managed Care – PPO | Source: Ambulatory Visit | Attending: Certified Nurse Midwife | Admitting: Certified Nurse Midwife

## 2019-01-04 DIAGNOSIS — Z1231 Encounter for screening mammogram for malignant neoplasm of breast: Secondary | ICD-10-CM

## 2019-01-05 ENCOUNTER — Other Ambulatory Visit: Payer: Self-pay | Admitting: Family Medicine

## 2019-02-11 ENCOUNTER — Other Ambulatory Visit: Payer: Self-pay

## 2019-02-13 ENCOUNTER — Other Ambulatory Visit: Payer: Self-pay

## 2019-02-13 ENCOUNTER — Encounter: Payer: Self-pay | Admitting: Obstetrics & Gynecology

## 2019-02-13 ENCOUNTER — Ambulatory Visit: Payer: BC Managed Care – PPO | Admitting: Obstetrics & Gynecology

## 2019-02-13 VITALS — BP 136/78 | HR 80 | Temp 97.6°F | Ht 66.75 in | Wt 285.6 lb

## 2019-02-13 DIAGNOSIS — Z23 Encounter for immunization: Secondary | ICD-10-CM | POA: Diagnosis not present

## 2019-02-13 DIAGNOSIS — Z01419 Encounter for gynecological examination (general) (routine) without abnormal findings: Secondary | ICD-10-CM | POA: Diagnosis not present

## 2019-02-13 NOTE — Progress Notes (Signed)
63 y.o. G68P3003 Single White or Caucasian female here for annual exam.  Denies vaginal bleeding.    Patient's last menstrual period was 11/21/2009.          Sexually active: No.  The current method of family planning is status post hysterectomy.    Exercising: Yes.    elliptical  Smoker:  no  Health Maintenance: Pap:  12/10/14 neg. HR HPV:neg   12/04/13 neg  History of abnormal Pap:  yes MMG:  01/04/19 BIRADS1:Neg  Colonoscopy:  01/04/18 polyps.  Dr. Silverio Decamp.  Adenomatous polyp.  Follow up 5 years.    BMD:   Never.  Plan around age 12. TDaP:  2009 Pneumonia vaccine(s):  n/a Shingrix:   Declines Hep C testing: no Screening Labs: PCP   reports that she has never smoked. She has never used smokeless tobacco. She reports previous alcohol use. She reports that she does not use drugs.  Past Medical History:  Diagnosis Date  . Allergic rhinitis    sees Dr. Annamaria Boots  . Anxiety   . Asthma    sees Dr. Annamaria Boots  . Basal cell carcinoma 2020   abdomen - right   . Carpal tunnel syndrome    sees Dr. Amedeo Plenty  . GERD (gastroesophageal reflux disease)   . Gynecological examination    sees Dr. Elyse Hsu  . Headache(784.0)    hx of pt states BP meds has improved   . Hypertension   . Osteoarthritis   . Thyroid disease     Past Surgical History:  Procedure Laterality Date  . BASAL CELL CARCINOMA EXCISION  2020   abdomen - right   . BUNIONECTOMY    . CARPAL TUNNEL RELEASE    . CERVICAL POLYPECTOMY N/A 04/05/2015   Procedure: CERVICAL POLYPECTOMY removal ;  Surgeon: Megan Salon, MD;  Location: [facility];  Service: Gynecology;  Laterality: N/A;  . COLONOSCOPY  01/04/2018   per Dr. Silverio Decamp, adenmatous polyps, repeat in 5 yrs   . DILATATION & CURRETTAGE/HYSTEROSCOPY WITH RESECTOCOPE N/A 04/05/2015   Procedure: DILATATION & CURETTAGE/HYSTEROSCOPY ;  Surgeon: Megan Salon, MD;  Location: [facility];  Service: Gynecology;  Laterality: N/A;  . DILATION AND CURETTAGE OF UTERUS    . hysteroscopic polyp  resection    . KNEE ARTHROSCOPY    . POLYPECTOMY    . REFRACTIVE SURGERY     related to hole in back of left eye as stated per pt   . ROBOTIC ASSISTED TOTAL HYSTERECTOMY WITH BILATERAL SALPINGO OOPHERECTOMY Bilateral 05/13/2015   Procedure: XI ROBOTIC ASSISTED TOTAL HYSTERECTOMY WITH BILATERAL SALPINGO OOPHORECTOMY;  Surgeon: Janie Morning, MD;  Location: [facility];  Service: Gynecology;  Laterality: Bilateral;  . TOTAL KNEE ARTHROPLASTY Left 05/04/2014   Procedure: LEFT TOTAL KNEE ARTHROPLASTY;  Surgeon: Mauri Pole, MD;  Location: [facility];  Service: Orthopedics;  Laterality: Left;  . TUBAL LIGATION      Current Outpatient Medications  Medication Sig Dispense Refill  . levothyroxine (SYNTHROID, LEVOTHROID) 50 MCG tablet TAKE 1 TABLET BY MOUTH EVERY DAY 90 tablet 3  . losartan-hydrochlorothiazide (HYZAAR) 50-12.5 MG tablet Take 1 tablet by mouth daily. 90 tablet 3  . Multiple Vitamin (MULTIVITAMIN) tablet Take 1 tablet by mouth every morning.     . naproxen sodium (ANAPROX) 220 MG tablet Take 440 mg by mouth 2 (two) times daily as needed (pain).    . pantoprazole (PROTONIX) 40 MG tablet TAKE 1 TABLET EVERY MORNING 90 tablet 1  . verapamil (CALAN) 120 MG tablet  TAKE 1 TABLET DAILY 90 tablet 1  . clindamycin (CLEOCIN) 300 MG capsule      Current Facility-Administered Medications  Medication Dose Route Frequency Provider Last Rate Last Dose  . 0.9 %  sodium chloride infusion  500 mL Intravenous Continuous Nandigam, Venia Minks, MD        Family History  Problem Relation Age of Onset  . Hypertension Mother   . Liver cancer Mother   . Heart disease Father   . Hypertension Father   . Diabetes Maternal Grandmother   . Cancer Maternal Aunt   . Breast cancer Neg Hx   . Colon cancer Neg Hx   . Esophageal cancer Neg Hx   . Stomach cancer Neg Hx     Review of Systems  All other systems reviewed and are negative.   Exam:   BP 136/78   Pulse 80   Temp 97.6 F (36.4 C) (Temporal)    Ht 5' 6.75" (1.695 m)   Wt 285 lb 9.6 oz (129.5 kg)   LMP 11/21/2009   BMI 45.07 kg/m     Height: 5' 6.75" (169.5 cm)  Ht Readings from Last 3 Encounters:  02/13/19 5' 6.75" (1.695 m)  12/30/18 5\' 8"  (1.727 m)  01/04/18 5\' 7"  (1.702 m)    General appearance: alert, cooperative and appears stated age Head: Normocephalic, without obvious abnormality, atraumatic Neck: no adenopathy, supple, symmetrical, trachea midline and thyroid normal to inspection and palpation Lungs: clear to auscultation bilaterally Breasts: normal appearance, no masses or tenderness Heart: regular rate and rhythm Abdomen: soft, non-tender; bowel sounds normal; no masses,  no organomegaly Extremities: extremities normal, atraumatic, no cyanosis or edema Skin: Skin color, texture, turgor normal. No rashes or lesions Lymph nodes: Cervical, supraclavicular, and axillary nodes normal. No abnormal inguinal nodes palpated Neurologic: Grossly normal   Pelvic: External genitalia:  no lesions              Urethra:  normal appearing urethra with no masses, tenderness or lesions              Bartholins and Skenes: normal                 Vagina: normal appearing vagina with normal color and discharge, no lesions              Cervix: absent              Pap taken: No. Bimanual Exam:  Uterus:  uterus absent              Adnexa: no mass, fullness, tenderness               Rectovaginal: Confirms               Anus:  normal sphincter tone, no lesions  Chaperone was present for exam.  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 Denman George Hypertension Hypothyroidism H/o LE edema with prior lasix use (but off now)  P:   Mammogram guidelines reviewed.  This is UTD. pap smear not indicated Tdap updated today Colonoscopy up to date Plan BMD around age 40 return annually or prn

## 2019-05-23 DIAGNOSIS — L814 Other melanin hyperpigmentation: Secondary | ICD-10-CM | POA: Diagnosis not present

## 2019-05-23 DIAGNOSIS — L821 Other seborrheic keratosis: Secondary | ICD-10-CM | POA: Diagnosis not present

## 2019-05-23 DIAGNOSIS — L738 Other specified follicular disorders: Secondary | ICD-10-CM | POA: Diagnosis not present

## 2019-05-23 DIAGNOSIS — D1801 Hemangioma of skin and subcutaneous tissue: Secondary | ICD-10-CM | POA: Diagnosis not present

## 2019-06-23 ENCOUNTER — Other Ambulatory Visit: Payer: Self-pay | Admitting: Family Medicine

## 2019-09-14 ENCOUNTER — Other Ambulatory Visit: Payer: Self-pay | Admitting: Family Medicine

## 2019-10-14 ENCOUNTER — Encounter: Payer: Self-pay | Admitting: Certified Nurse Midwife

## 2019-11-03 ENCOUNTER — Telehealth (INDEPENDENT_AMBULATORY_CARE_PROVIDER_SITE_OTHER): Payer: 59 | Admitting: Family Medicine

## 2019-11-03 ENCOUNTER — Encounter: Payer: Self-pay | Admitting: Family Medicine

## 2019-11-03 DIAGNOSIS — J019 Acute sinusitis, unspecified: Secondary | ICD-10-CM

## 2019-11-03 NOTE — Progress Notes (Signed)
Subjective:    Patient ID: Gabriela Shepherd, female    DOB: Nov 28, 1955, 65 y.o.   MRN: [id number]  HPI Virtual Visit via Video Note  I connected with the patient on 11/03/19 at  1:30 PM EDT by a video enabled telemedicine application and verified that I am speaking with the correct person using two identifiers.  Location patient: home Location provider:work or home office Persons participating in the virtual visit: patient, provider  I discussed the limitations of evaluation and management by telemedicine and the availability of in person appointments. The patient expressed understanding and agreed to proceed.   HPI: Here for one week of sinus pressure, headache, and blowing bloody mucus from the nose. Prior to that she has had an intermittent dry cough for 2 months. No fever or ST. She has tried Vicks nasal sprays and Claritin.   ROS: See pertinent positives and negatives per HPI.  Past Medical History:  Diagnosis Date  . Allergic rhinitis    sees Dr. Annamaria Boots  . Anxiety   . Asthma    sees Dr. Annamaria Boots  . Basal cell carcinoma 2020   abdomen - right   . Carpal tunnel syndrome    sees Dr. Amedeo Plenty  . GERD (gastroesophageal reflux disease)   . Gynecological examination    sees Dr. Elyse Hsu  . Headache(784.0)    hx of pt states BP meds has improved   . Hypertension   . Osteoarthritis   . Thyroid disease     Past Surgical History:  Procedure Laterality Date  . BASAL CELL CARCINOMA EXCISION  2020   abdomen - right   . BUNIONECTOMY    . CARPAL TUNNEL RELEASE    . CERVICAL POLYPECTOMY N/A 04/05/2015   Procedure: CERVICAL POLYPECTOMY removal ;  Surgeon: Megan Salon, MD;  Location: [facility];  Service: Gynecology;  Laterality: N/A;  . COLONOSCOPY  01/04/2018   per Dr. Silverio Decamp, adenmatous polyps, repeat in 5 yrs   . DILATATION & CURRETTAGE/HYSTEROSCOPY WITH RESECTOCOPE N/A 04/05/2015   Procedure: DILATATION & CURETTAGE/HYSTEROSCOPY ;  Surgeon: Megan Salon, MD;  Location:  [facility];  Service: Gynecology;  Laterality: N/A;  . DILATION AND CURETTAGE OF UTERUS    . hysteroscopic polyp resection    . KNEE ARTHROSCOPY    . POLYPECTOMY    . REFRACTIVE SURGERY     related to hole in back of left eye as stated per pt   . ROBOTIC ASSISTED TOTAL HYSTERECTOMY WITH BILATERAL SALPINGO OOPHERECTOMY Bilateral 05/13/2015   Procedure: XI ROBOTIC ASSISTED TOTAL HYSTERECTOMY WITH BILATERAL SALPINGO OOPHORECTOMY;  Surgeon: Janie Morning, MD;  Location: [facility];  Service: Gynecology;  Laterality: Bilateral;  . TOTAL KNEE ARTHROPLASTY Left 05/04/2014   Procedure: LEFT TOTAL KNEE ARTHROPLASTY;  Surgeon: Mauri Pole, MD;  Location: [facility];  Service: Orthopedics;  Laterality: Left;  . TUBAL LIGATION      Family History  Problem Relation Age of Onset  . Hypertension Mother   . Liver cancer Mother   . Heart disease Father   . Hypertension Father   . Diabetes Maternal Grandmother   . Cancer Maternal Aunt   . Breast cancer Neg Hx   . Colon cancer Neg Hx   . Esophageal cancer Neg Hx   . Stomach cancer Neg Hx      Current Outpatient Medications:  .  clindamycin (CLEOCIN) 300 MG capsule, , Disp: , Rfl:  .  levothyroxine (SYNTHROID) 50 MCG tablet, TAKE 1 TABLET BY MOUTH EVERY  DAY, Disp: 90 tablet, Rfl: 0 .  losartan-hydrochlorothiazide (HYZAAR) 50-12.5 MG tablet, Take 1 tablet by mouth daily., Disp: 90 tablet, Rfl: 3 .  Multiple Vitamin (MULTIVITAMIN) tablet, Take 1 tablet by mouth every morning. , Disp: , Rfl:  .  naproxen sodium (ANAPROX) 220 MG tablet, Take 440 mg by mouth 2 (two) times daily as needed (pain)., Disp: , Rfl:  .  pantoprazole (PROTONIX) 40 MG tablet, TAKE 1 TABLET EVERY MORNING, Disp: 90 tablet, Rfl: 1 .  verapamil (CALAN) 120 MG tablet, TAKE 1 TABLET DAILY, Disp: 90 tablet, Rfl: 1  Current Facility-Administered Medications:  .  0.9 %  sodium chloride infusion, 500 mL, Intravenous, Continuous, Nandigam, Kavitha V, MD  EXAM:  VITALS per patient if  applicable:  GENERAL: alert, oriented, appears well and in no acute distress  HEENT: atraumatic, conjunttiva clear, no obvious abnormalities on inspection of external nose and ears  NECK: normal movements of the head and neck  LUNGS: on inspection no signs of respiratory distress, breathing rate appears normal, no obvious gross SOB, gasping or wheezing  CV: no obvious cyanosis  MS: moves all visible extremities without noticeable abnormality  PSYCH/NEURO: pleasant and cooperative, no obvious depression or anxiety, speech and thought processing grossly intact  ASSESSMENT AND PLAN: Sinusitis, treat with Doxycycline and Mucinex.  Alysia Penna, MD  Discussed the following assessment and plan:  No diagnosis found.     I discussed the assessment and treatment plan with the patient. The patient was provided an opportunity to ask questions and all were answered. The patient agreed with the plan and demonstrated an understanding of the instructions.   The patient was advised to call back or seek an in-person evaluation if the symptoms worsen or if the condition fails to improve as anticipated.     Review of Systems     Objective:   Physical Exam        Assessment & Plan:

## 2019-11-04 ENCOUNTER — Telehealth: Payer: Self-pay | Admitting: Family Medicine

## 2019-11-04 MED ORDER — DOXYCYCLINE HYCLATE 100 MG PO CAPS: 100.0000 mg | ORAL_CAPSULE | Freq: Two times a day (BID) | ORAL | 0 refills | Status: AC

## 2019-11-04 NOTE — Telephone Encounter (Signed)
Patient took a generic Claritin and wants to know if it is  okay for her to take the medication that Dr. Sarajane Jews was supposed to call in yesterday.   He was supposed to call in Doxycycline to  [id number] - [place], [address] Phone:  [phone]  Fax:  [phone]     The Rx was not sent to the pharmacy yesterday so she took the Claritin today.  Please advise

## 2019-11-04 NOTE — Addendum Note (Signed)
Addended by: Alysia Penna A on: 11/04/2019 05:24 PM   Modules accepted: Orders

## 2019-11-04 NOTE — Telephone Encounter (Signed)
I resent the Doxycycline. Yes she can take Claritin with this

## 2019-11-24 ENCOUNTER — Other Ambulatory Visit: Payer: Self-pay

## 2019-11-24 ENCOUNTER — Telehealth (INDEPENDENT_AMBULATORY_CARE_PROVIDER_SITE_OTHER): Payer: 59 | Admitting: Family Medicine

## 2019-11-24 ENCOUNTER — Encounter: Payer: Self-pay | Admitting: Family Medicine

## 2019-11-24 DIAGNOSIS — J301 Allergic rhinitis due to pollen: Secondary | ICD-10-CM | POA: Diagnosis not present

## 2019-11-24 MED ORDER — METHYLPREDNISOLONE 4 MG PO TBPK: ORAL_TABLET | ORAL | 0 refills | Status: DC

## 2019-11-24 NOTE — Progress Notes (Signed)
   Subjective:    Patient ID: Gabriela Shepherd, female    DOB: 02-02-1956, 64 y.o.   MRN: [id number]  HPI Here for 6 weeks of nasal congestion. She always has trouble with the pollen this time of year. She was here for a sinusitis with fever and coughing on 11-03-19, and we treated this with Doxycycline. That resolved but the nasal congestion remains despite using Flonase, saline sprays, Sudafed and Claritin.  Virtual Visit via Telephone Note  I connected with the patient on 11/24/19 at  4:00 PM EDT by telephone and verified that I am speaking with the correct person using two identifiers.   I discussed the limitations, risks, security and privacy concerns of performing an evaluation and management service by telephone and the availability of in person appointments. I also discussed with the patient that there may be a patient responsible charge related to this service. The patient expressed understanding and agreed to proceed.  Location patient: home Location provider: work or home office Participants present for the call: patient, provider Patient did not have a visit in the prior 7 days to address this/these issue(s).   History of Present Illness:    Observations/Objective: Patient sounds cheerful and well on the phone. I do not appreciate any SOB. Speech and thought processing are grossly intact. Patient reported vitals:  Assessment and Plan: Allergic rhinitis, try a Medrol dose pack. Recheck prn.  Alysia Penna, MD   Follow Up Instructions:     [phone] 11-20 [phone] 21-30 I did not refer this patient for an OV in the next 24 hours for this/these issue(s).  I discussed the assessment and treatment plan with the patient. The patient was provided an opportunity to ask questions and all were answered. The patient agreed with the plan and demonstrated an understanding of the instructions.   The patient was advised to call back or seek an in-person evaluation if the  symptoms worsen or if the condition fails to improve as anticipated.  I provided 10 minutes of non-face-to-face time during this encounter.   Alysia Penna, MD   Review of Systems     Objective:   Physical Exam        Assessment & Plan:

## 2019-12-04 ENCOUNTER — Other Ambulatory Visit: Payer: Self-pay | Admitting: Obstetrics & Gynecology

## 2019-12-04 DIAGNOSIS — Z1231 Encounter for screening mammogram for malignant neoplasm of breast: Secondary | ICD-10-CM

## 2019-12-08 ENCOUNTER — Telehealth: Payer: Self-pay | Admitting: Family Medicine

## 2019-12-08 NOTE — Telephone Encounter (Signed)
Noted  

## 2019-12-08 NOTE — Telephone Encounter (Signed)
pt was seen at [facility] urgent care for Cellulitis over the weekend   wanted to inform dr fry of this still have some redness

## 2019-12-10 ENCOUNTER — Other Ambulatory Visit: Payer: Self-pay | Admitting: Family Medicine

## 2019-12-17 ENCOUNTER — Encounter: Payer: Self-pay | Admitting: Family Medicine

## 2019-12-18 ENCOUNTER — Other Ambulatory Visit: Payer: Self-pay

## 2019-12-19 ENCOUNTER — Ambulatory Visit (INDEPENDENT_AMBULATORY_CARE_PROVIDER_SITE_OTHER): Payer: No Typology Code available for payment source | Admitting: Family Medicine

## 2019-12-19 ENCOUNTER — Encounter: Payer: Self-pay | Admitting: Family Medicine

## 2019-12-19 ENCOUNTER — Telehealth: Payer: Self-pay

## 2019-12-19 VITALS — BP 110/70 | HR 102 | Temp 98.4°F | Wt 269.0 lb

## 2019-12-19 DIAGNOSIS — R21 Rash and other nonspecific skin eruption: Secondary | ICD-10-CM | POA: Diagnosis not present

## 2019-12-19 DIAGNOSIS — L03116 Cellulitis of left lower limb: Secondary | ICD-10-CM | POA: Diagnosis not present

## 2019-12-19 DIAGNOSIS — L03115 Cellulitis of right lower limb: Secondary | ICD-10-CM

## 2019-12-19 DIAGNOSIS — R600 Localized edema: Secondary | ICD-10-CM | POA: Diagnosis not present

## 2019-12-19 MED ORDER — DOXYCYCLINE HYCLATE 100 MG PO TABS: 100.0000 mg | ORAL_TABLET | Freq: Two times a day (BID) | ORAL | 0 refills | Status: AC

## 2019-12-19 NOTE — Telephone Encounter (Signed)
Called pt left detailed  message for pt to stay out of the sun while taking Doxycycline per Dr Volanda Napoleon, Advised pt to call the office with further questions

## 2019-12-19 NOTE — Progress Notes (Signed)
Subjective:    Patient ID: Gabriela Shepherd, female    DOB: September 24, 1955, 64 y.o.   MRN: [id number]  No chief complaint on file.   HPI Patient was seen today for ongoing concern.  Pt called after hrs line several wks ago and was advised to go to [facility] for possible cellulitis of LEs.  Pt was given keflex.  After completion of med pt noticed small red dots on LEs, LE edema, increased erythema, "hard skin", and a burning/stinging sensation. Pt has not tried anything else for her symptoms.  Pt drinks mostly water and does not note increased salt intake.  Pt has an allergy to PCN, caused rash/peeling skin in groin as a child.  Past Medical History:  Diagnosis Date  . Allergic rhinitis    sees Dr. Annamaria Boots  . Anxiety   . Asthma    sees Dr. Annamaria Boots  . Basal cell carcinoma 2020   abdomen - right   . Carpal tunnel syndrome    sees Dr. Amedeo Plenty  . GERD (gastroesophageal reflux disease)   . Gynecological examination    sees Dr. Elyse Hsu  . Headache(784.0)    hx of pt states BP meds has improved   . Hypertension   . Osteoarthritis   . Thyroid disease     Allergies  Allergen Reactions  . Amoxicillin Itching and Rash    Has patient had a PCN reaction causing immediate rash, facial/tongue/throat swelling, SOB or lightheadedness with hypotension: no Has patient had a PCN reaction causing severe rash involving mucus membranes or skin necrosis: no Has patient had a PCN reaction that required hospitalization no Has patient had a PCN reaction occurring within the last 10 years: no If all of the above answers are "NO", then may proceed with Cephalosporin use.    Marland Kitchen Penicillins Rash    Has patient had a PCN reaction causing immediate rash, facial/tongue/throat swelling, SOB or lightheadedness with hypotension: no Has patient had a PCN reaction causing severe rash involving mucus membranes or skin necrosis: no Has patient had a PCN reaction that required hospitalization no Has patient had a PCN  reaction occurring within the last 10 years: no If all of the above answers are "NO", then may proceed with Cephalosporin use.     ROS General: Denies fever, chills, night sweats, changes in weight, changes in appetite HEENT: Denies headaches, ear pain, changes in vision, rhinorrhea, sore throat CV: Denies CP, palpitations, SOB, orthopnea Pulm: Denies SOB, cough, wheezing GI: Denies abdominal pain, nausea, vomiting, diarrhea, constipation GU: Denies dysuria, hematuria, frequency, vaginal discharge Msk: Denies muscle cramps, joint pains Neuro: Denies weakness, numbness, tingling  +burning/stinging of ankles. Skin: Denies bruising +erythematous rash on LEs Psych: Denies depression, anxiety, hallucinations    Objective:    Blood pressure 110/70, pulse (!) 102, temperature 98.4 F (36.9 C), temperature source Temporal, weight 269 lb (122 kg), last menstrual period 11/21/2009, SpO2 98 %.  Gen. Pleasant, well-nourished, in no distress, normal affect   HEENT: [place]/AT, face symmetric, no scleral icterus, PERRLA, EOMI, nares patent without drainage Lungs: no accessory muscle use, CTAB, no wheezes or rales Cardiovascular: RRR, no m/r/g, 1+ pitting edema at feet, ankles. Trace at b/l shins. Musculoskeletal: No deformities, no cyanosis or clubbing, normal tone Neuro:  A&Ox3, CN II-XII intact, normal gait Skin:  Warm, dry, intact.  B/l ankles with erythematous plaque with induration, and increased warmth.  Small, nonblanchable, morbiliform rash noted with lesions to b/l thighs.        Wt  Readings from Last 3 Encounters:  12/19/19 269 lb (122 kg)  02/13/19 285 lb 9.6 oz (129.5 kg)  12/30/18 287 lb 8 oz (130.4 kg)    Lab Results  Component Value Date   WBC 7.1 12/30/2018   HGB 14.0 12/30/2018   HCT 41.4 12/30/2018   PLT 227.0 12/30/2018   GLUCOSE 100 (H) 12/30/2018   CHOL 226 (H) 12/30/2018   TRIG 173.0 (H) 12/30/2018   HDL 47.00 12/30/2018   LDLDIRECT 143.9 03/14/2013   LDLCALC  144 (H) 12/30/2018   ALT 27 12/30/2018   AST 22 12/30/2018   NA 141 12/30/2018   K 4.2 12/30/2018   CL 103 12/30/2018   CREATININE 0.70 12/30/2018   BUN 20 12/30/2018   CO2 30 12/30/2018   TSH 2.26 12/30/2018   INR 1.00 04/23/2014    Assessment/Plan:  Cellulitis of both lower extremities  -avoid sun exposure - Plan: doxycycline (VIBRA-TABS) 100 MG tablet  Bilateral lower extremity edema -discussed supportive care -avoid excess sodium intake   Skin eruption -photo toxic erruption vs drug induced IgE mediation. -more likely phototoxic 2/2 sun exposure as only on LEs and visible tan noted on pt provided pictures. -avoid sun exposure, wear protective clothing/sunscreen -given precautions  F/u with pcp next wk for continued or worsening symptoms.  Grier Mitts, MD

## 2019-12-19 NOTE — Patient Instructions (Signed)
Cellulitis, Adult  Cellulitis is a skin infection. The infected area is often warm, red, swollen, and sore. It occurs most often in the arms and lower legs. It is very important to get treated for this condition. What are the causes? This condition is caused by bacteria. The bacteria enter through a break in the skin, such as a cut, burn, insect bite, open sore, or crack. What increases the risk? This condition is more likely to occur in people who:  Have a weak body defense system (immune system).  Have open cuts, burns, bites, or scrapes on the skin.  Are older than 62 years of age.  Have a blood sugar problem (diabetes).  Have a long-lasting (chronic) liver disease (cirrhosis) or kidney disease.  Are very overweight (obese).  Have a skin problem, such as: ? Itchy rash (eczema). ? Slow movement of blood in the veins (venous stasis). ? Fluid buildup below the skin (edema).  Have been treated with high-energy rays (radiation).  Use IV drugs. What are the signs or symptoms? Symptoms of this condition include:  Skin that is: ? Red. ? Streaking. ? Spotting. ? Swollen. ? Sore or painful when you touch it. ? Warm.  A fever.  Chills.  Blisters. How is this diagnosed? This condition is diagnosed based on:  Medical history.  Physical exam.  Blood tests.  Imaging tests. How is this treated? Treatment for this condition may include:  Medicines to treat infections or allergies.  Home care, such as: ? Rest. ? Placing cold or warm cloths (compresses) on the skin.  Hospital care, if the condition is very bad. Follow these instructions at home: Medicines  Take over-the-counter and prescription medicines only as told by your doctor.  If you were prescribed an antibiotic medicine, take it as told by your doctor. Do not stop taking it even if you start to feel better. General instructions   Drink enough fluid to keep your pee (urine) pale yellow.  Do not touch  or rub the infected area.  Raise (elevate) the infected area above the level of your heart while you are sitting or lying down.  Place cold or warm cloths on the area as told by your doctor.  Keep all follow-up visits as told by your doctor. This is important. Contact a doctor if:  You have a fever.  You do not start to get better after 1-2 days of treatment.  Your bone or joint under the infected area starts to hurt after the skin has healed.  Your infection comes back. This can happen in the same area or another area.  You have a swollen bump in the area.  You have new symptoms.  You feel ill and have muscle aches and pains. Get help right away if:  Your symptoms get worse.  You feel very sleepy.  You throw up (vomit) or have watery poop (diarrhea) for a long time.  You see red streaks coming from the area.  Your red area gets larger.  Your red area turns dark in color. These symptoms may represent a serious problem that is an emergency. Do not wait to see if the symptoms will go away. Get medical help right away. Call your local emergency services (911 in the [place] Do not drive yourself to the hospital. Summary  Cellulitis is a skin infection. The area is often warm, red, swollen, and sore.  This condition is treated with medicines, rest, and cold and warm cloths.  Take all medicines only   as told by your doctor.  Tell your doctor if symptoms do not start to get better after 1-2 days of treatment. This information is not intended to replace advice given to you by your health care provider. Make sure you discuss any questions you have with your health care provider. Document Revised: 11/29/2017 Document Reviewed: 11/29/2017 Elsevier Patient Education  [place].  Peripheral Edema  Peripheral edema is swelling that is caused by a buildup of fluid. Peripheral edema most often affects the lower legs, ankles, and feet. It can also develop in the arms, hands,  and face. The area of the body that has peripheral edema will look swollen. It may also feel heavy or warm. Your clothes may start to feel tight. Pressing on the area may make a temporary dent in your skin. You may not be able to move your swollen arm or leg as much as usual. There are many causes of peripheral edema. It can happen because of a complication of other conditions such as congestive heart failure, kidney disease, or a problem with your blood circulation. It also can be a side effect of certain medicines or because of an infection. It often happens to women during pregnancy. Sometimes, the cause is not known. Follow these instructions at home: Managing pain, stiffness, and swelling   Raise (elevate) your legs while you are sitting or lying down.  Move around often to prevent stiffness and to lessen swelling.  Do not sit or stand for long periods of time.  Wear support stockings as told by your health care provider. Medicines  Take over-the-counter and prescription medicines only as told by your health care provider.  Your health care provider may prescribe medicine to help your body get rid of excess water (diuretic). General instructions  Pay attention to any changes in your symptoms.  Follow instructions from your health care provider about limiting salt (sodium) in your diet. Sometimes, eating less salt may reduce swelling.  Moisturize skin daily to help prevent skin from cracking and draining.  Keep all follow-up visits as told by your health care provider. This is important. Contact a health care provider if you have:  A fever.  Edema that starts suddenly or is getting worse, especially if you are pregnant or have a medical condition.  Swelling in only one leg.  Increased swelling, redness, or pain in one or both of your legs.  Drainage or sores at the area where you have edema. Get help right away if you:  Develop shortness of breath, especially when you are  lying down.  Have pain in your chest or abdomen.  Feel weak.  Feel faint. Summary  Peripheral edema is swelling that is caused by a buildup of fluid. Peripheral edema most often affects the lower legs, ankles, and feet.  Move around often to prevent stiffness and to lessen swelling. Do not sit or stand for long periods of time.  Pay attention to any changes in your symptoms.  Contact a health care provider if you have edema that starts suddenly or is getting worse, especially if you are pregnant or have a medical condition.  Get help right away if you develop shortness of breath, especially when lying down. This information is not intended to replace advice given to you by your health care provider. Make sure you discuss any questions you have with your health care provider. Document Revised: 04/03/2018 Document Reviewed: 04/03/2018 Elsevier Patient Education  [place].

## 2019-12-22 ENCOUNTER — Other Ambulatory Visit: Payer: Self-pay | Admitting: Family Medicine

## 2019-12-30 ENCOUNTER — Encounter: Payer: Self-pay | Admitting: Family Medicine

## 2019-12-30 ENCOUNTER — Other Ambulatory Visit: Payer: Self-pay

## 2019-12-30 NOTE — Telephone Encounter (Signed)
Try applying 1% hydrocortisone cream to them today. I will see her tomorrow

## 2019-12-31 ENCOUNTER — Other Ambulatory Visit (INDEPENDENT_AMBULATORY_CARE_PROVIDER_SITE_OTHER): Payer: No Typology Code available for payment source

## 2019-12-31 ENCOUNTER — Ambulatory Visit (INDEPENDENT_AMBULATORY_CARE_PROVIDER_SITE_OTHER): Payer: No Typology Code available for payment source | Admitting: Family Medicine

## 2019-12-31 ENCOUNTER — Encounter: Payer: Self-pay | Admitting: Family Medicine

## 2019-12-31 VITALS — BP 130/70 | HR 103 | Temp 98.6°F | Ht 66.75 in | Wt 269.0 lb

## 2019-12-31 DIAGNOSIS — Z0000 Encounter for general adult medical examination without abnormal findings: Secondary | ICD-10-CM | POA: Diagnosis not present

## 2019-12-31 DIAGNOSIS — R21 Rash and other nonspecific skin eruption: Secondary | ICD-10-CM

## 2019-12-31 DIAGNOSIS — E039 Hypothyroidism, unspecified: Secondary | ICD-10-CM

## 2019-12-31 LAB — LIPID PANEL
Cholesterol: 178 mg/dL (ref 0–200)
HDL: 37.4 mg/dL — ABNORMAL LOW (ref >39.00)
LDL Cholesterol: 119 mg/dL — ABNORMAL HIGH (ref 0–99)
NonHDL: 140.59
Total CHOL/HDL Ratio: 5
Triglycerides: 108 mg/dL (ref 0.0–149.0)
VLDL: 21.6 mg/dL (ref 0.0–40.0)

## 2019-12-31 LAB — CBC WITH DIFFERENTIAL/PLATELET
Basophils Absolute: 0.1 10*3/uL (ref 0.0–0.1)
Basophils Relative: 0.5 % (ref 0.0–3.0)
Eosinophils Absolute: 0.2 10*3/uL (ref 0.0–0.7)
Eosinophils Relative: 1.5 % (ref 0.0–5.0)
HCT: 36.7 % (ref 36.0–46.0)
Hemoglobin: 12.3 g/dL (ref 12.0–15.0)
Lymphocytes Relative: 14.6 % (ref 12.0–46.0)
Lymphs Abs: 1.5 10*3/uL (ref 0.7–4.0)
MCHC: 33.4 g/dL (ref 30.0–36.0)
MCV: 83.6 fl (ref 78.0–100.0)
Monocytes Absolute: 0.4 10*3/uL (ref 0.1–1.0)
Monocytes Relative: 4.3 % (ref 3.0–12.0)
Neutro Abs: 8.2 10*3/uL — ABNORMAL HIGH (ref 1.4–7.7)
Neutrophils Relative %: 79.1 % — ABNORMAL HIGH (ref 43.0–77.0)
Platelets: 306 10*3/uL (ref 150.0–400.0)
RBC: 4.4 Mil/uL (ref 3.87–5.11)
RDW: 14.8 % (ref 11.5–15.5)
WBC: 10.3 10*3/uL (ref 4.0–10.5)

## 2019-12-31 LAB — BASIC METABOLIC PANEL
BUN: 15 mg/dL (ref 6–23)
CO2: 27 mEq/L (ref 19–32)
Calcium: 9.5 mg/dL (ref 8.4–10.5)
Chloride: 101 mEq/L (ref 96–112)
Creatinine, Ser: 0.7 mg/dL (ref 0.40–1.20)
GFR: 84.69 mL/min (ref >60.00)
Glucose, Bld: 114 mg/dL — ABNORMAL HIGH (ref 70–99)
Potassium: 3.5 mEq/L (ref 3.5–5.1)
Sodium: 139 mEq/L (ref 135–145)

## 2019-12-31 LAB — TSH: TSH: 2.58 u[IU]/mL (ref 0.35–4.50)

## 2019-12-31 LAB — HEPATIC FUNCTION PANEL
ALT: 18 U/L (ref 0–35)
AST: 19 U/L (ref 0–37)
Albumin: 4 g/dL (ref 3.5–5.2)
Alkaline Phosphatase: 92 U/L (ref 39–117)
Bilirubin, Direct: 0.2 mg/dL (ref 0.0–0.3)
Total Bilirubin: 0.5 mg/dL (ref 0.2–1.2)
Total Protein: 7.8 g/dL (ref 6.0–8.3)

## 2019-12-31 LAB — C-REACTIVE PROTEIN: CRP: 10.4 mg/dL (ref 0.5–20.0)

## 2019-12-31 LAB — T3, FREE: T3, Free: 3.5 pg/mL (ref 2.3–4.2)

## 2019-12-31 LAB — T4, FREE: Free T4: 1.03 ng/dL (ref 0.60–1.60)

## 2019-12-31 LAB — SEDIMENTATION RATE: Sed Rate: 82 mm/hr — ABNORMAL HIGH (ref 0–30)

## 2019-12-31 MED ORDER — PREDNISONE 10 MG PO TABS: 10.0000 mg | ORAL_TABLET | Freq: Two times a day (BID) | ORAL | 0 refills | Status: DC

## 2019-12-31 NOTE — Progress Notes (Signed)
Subjective:    Patient ID: Gabriela Shepherd, female    DOB: 07-Feb-1956, 64 y.o.   MRN: [id number]  HPI Here for a well exam and also to discuss a rash on the legs. She was doing well until about 3 weeks ago when she developed swelling, redness, itching, and a splotchy red rash on both lower legs. She has been spending a lot of time on her back deck in the sun this spring. She went ot urgent care and was diagnosed with cellulitis, and they gave her Keflex. Then she saw [place] and was given Doxycycline. These medications did not help however and the rash has steadily gotten worse. It now extends to the waist on both sides. Her skin is breaking open in spots and is oozing bloody fluid, and her legs are in pain. No fever. Otherwise she has been doing well.   Review of Systems  Constitutional: Negative.   HENT: Negative.   Eyes: Negative.   Respiratory: Negative.   Cardiovascular: Positive for leg swelling.  Gastrointestinal: Negative.   Genitourinary: Negative for decreased urine volume, difficulty urinating, dyspareunia, dysuria, enuresis, flank pain, frequency, hematuria, pelvic pain and urgency.  Musculoskeletal: Negative.   Skin: Positive for rash.  Neurological: Negative.   Psychiatric/Behavioral: Negative.        Objective:   Physical Exam Constitutional:      Appearance: She is well-developed. She is obese.  HENT:     Head: Normocephalic and atraumatic.     Right Ear: External ear normal.     Left Ear: External ear normal.     Nose: Nose normal.     Mouth/Throat:     Pharynx: No oropharyngeal exudate.  Eyes:     General: No scleral icterus.    Conjunctiva/sclera: Conjunctivae normal.     Pupils: Pupils are equal, round, and reactive to light.  Neck:     Thyroid: No thyromegaly.     Vascular: No JVD.  Cardiovascular:     Rate and Rhythm: Normal rate and regular rhythm.     Heart sounds: Normal heart sounds. No murmur. No friction rub. No gallop.   Pulmonary:     Effort:  Pulmonary effort is normal. No respiratory distress.     Breath sounds: Normal breath sounds. No wheezing or rales.  Chest:     Chest wall: No tenderness.  Abdominal:     General: Bowel sounds are normal. There is no distension.     Palpations: Abdomen is soft. There is no mass.     Tenderness: There is no abdominal tenderness. There is no guarding or rebound.  Musculoskeletal:        General: No tenderness. Normal range of motion.     Cervical back: Normal range of motion and neck supple.  Lymphadenopathy:     Cervical: No cervical adenopathy.  Skin:    General: Skin is warm and dry.     Comments: Both lower legs have macular erythema from the ankles to the knees. Also there is a spotchy red rash which is slightly raised from the middle lower legs to the thighs on both sides. Some of these have broken open and are oozing a bloody fluid. No warmth, but her legs are very tender to the touch   Neurological:     Mental Status: She is alert and oriented to person, place, and time.     Cranial Nerves: No cranial nerve deficit.     Motor: No abnormal muscle tone.  Coordination: Coordination normal.     Deep Tendon Reflexes: Reflexes are normal and symmetric. Reflexes normal.  Psychiatric:        Behavior: Behavior normal.        Thought Content: Thought content normal.        Judgment: Judgment normal.           Assessment & Plan:  Well exam. We discussed diet and exercise. Get fasting labs. The rash on the legs appears to be due to a vasculitis, possible triggered by sunlight, rather than to cellulitis. We will add ESR, CRP, ANA, and DS DNA to her labs. Start on Prednisone 10 mg BID. Refer to Dermatology to evaluate.  Alysia Penna, MD

## 2019-12-31 NOTE — Addendum Note (Signed)
Addended by: Rebecca Eaton on: 12/31/2019 09:17 AM   Modules accepted: Orders

## 2020-01-01 ENCOUNTER — Encounter: Payer: Self-pay | Admitting: Family Medicine

## 2020-01-02 NOTE — Telephone Encounter (Signed)
I would wrap them with clean dry gauze

## 2020-01-02 NOTE — Telephone Encounter (Signed)
Yes the Prednisone should help the shoulder, but she can add Tylenol to this if she wishes

## 2020-01-03 ENCOUNTER — Encounter: Payer: Self-pay | Admitting: Family Medicine

## 2020-01-03 LAB — ANTI-NUCLEAR AB-TITER (ANA TITER)
ANA TITER: 1:40 {titer} — ABNORMAL HIGH
ANA Titer 1: 1:80 {titer} — ABNORMAL HIGH

## 2020-01-03 LAB — ANTI-DNA ANTIBODY, DOUBLE-STRANDED: ds DNA Ab: 1 IU/mL

## 2020-01-03 LAB — ANA: Anti Nuclear Antibody (ANA): POSITIVE — AB

## 2020-01-05 NOTE — Telephone Encounter (Signed)
Yes I referred her to Dermatology so they should contact her any day now

## 2020-01-06 ENCOUNTER — Other Ambulatory Visit: Payer: Self-pay

## 2020-01-06 ENCOUNTER — Ambulatory Visit
Admission: RE | Admit: 2020-01-06 | Discharge: 2020-01-06 | Disposition: A | Discharge status: Home / Self Care | Payer: 59 | Source: Ambulatory Visit | Attending: Obstetrics & Gynecology | Admitting: Obstetrics & Gynecology

## 2020-01-06 DIAGNOSIS — Z1231 Encounter for screening mammogram for malignant neoplasm of breast: Secondary | ICD-10-CM

## 2020-01-14 ENCOUNTER — Encounter: Payer: Self-pay | Admitting: Family Medicine

## 2020-01-20 NOTE — Telephone Encounter (Signed)
It is hard to say without an exam. It could be thrush. If this is still going on this week, have her come in

## 2020-01-23 ENCOUNTER — Other Ambulatory Visit: Payer: Self-pay | Admitting: Family Medicine

## 2020-01-23 NOTE — Telephone Encounter (Signed)
Please advise. Should the patient continue this.

## 2020-01-29 ENCOUNTER — Encounter: Payer: Self-pay | Admitting: Family Medicine

## 2020-03-06 ENCOUNTER — Other Ambulatory Visit: Payer: Self-pay | Admitting: Family Medicine

## 2020-04-03 ENCOUNTER — Other Ambulatory Visit: Payer: Self-pay | Admitting: Family Medicine

## 2020-04-05 MED ORDER — LEVOTHYROXINE SODIUM 50 MCG PO TABS: 50.0000 ug | ORAL_TABLET | Freq: Every day | ORAL | 0 refills | Status: DC

## 2020-04-20 NOTE — Progress Notes (Signed)
64 y.o. G54P3003 Single White or Caucasian female here for annual exam.  Has been diagnosed with ANCA associated vasculitis.  Diagnosed via dermatology.  Has seen nephrology and had kidney biopsy 01/2020 and is followed by rheumatology.  Is tapering off steroids but having feet and ankle changes.  Is going to send pictures to rheumatologist today.      Denies vaginal bleeding.  Does vaginal odor.    Patient's last menstrual period was 11/21/2009.          Sexually active: No.  The current method of family planning is status post hysterectomy.    Exercising: Yes.    home exercises Smoker:  no  Health Maintenance: Pap:  12-10-14 neg HPV HR neg History of abnormal Pap:  yes MMG:  01-08-2020 category b density birads 1:neg Colonoscopy:  01-04-18 polyps f/u 91yrs Dr Silverio Decamp. Adenomatous polyp BMD:   no TDaP:  01/2019 Pneumonia vaccine(s):  2021 Shingrix:   D/w pt today Hep C testing: had done per patient Screening Labs: 12/2019   reports that she has never smoked. She has never used smokeless tobacco. She reports previous alcohol use. She reports that she does not use drugs.  Past Medical History:  Diagnosis Date  . Allergic rhinitis    sees Dr. Annamaria Boots  . Anxiety   . Asthma    sees Dr. Annamaria Boots  . Basal cell carcinoma 2020   abdomen - right   . Carpal tunnel syndrome    sees Dr. Amedeo Plenty  . GERD (gastroesophageal reflux disease)   . Gynecological examination    sees Dr. Elyse Hsu  . Headache(784.0)    hx of pt states BP meds has improved   . Hypertension   . Osteoarthritis   . Thyroid disease   . Vasculitis [facility])     Past Surgical History:  Procedure Laterality Date  . BASAL CELL CARCINOMA EXCISION  2020   abdomen - right   . BUNIONECTOMY    . CARPAL TUNNEL RELEASE    . CERVICAL POLYPECTOMY N/A 04/05/2015   Procedure: CERVICAL POLYPECTOMY removal ;  Surgeon: Megan Salon, MD;  Location: [place] [facility];  Service: Gynecology;  Laterality: N/A;  . COLONOSCOPY  01/04/2018   per Dr.  Silverio Decamp, adenmatous polyps, repeat in 5 yrs   . DILATATION & CURRETTAGE/HYSTEROSCOPY WITH RESECTOCOPE N/A 04/05/2015   Procedure: DILATATION & CURETTAGE/HYSTEROSCOPY ;  Surgeon: Megan Salon, MD;  Location: [place] [facility];  Service: Gynecology;  Laterality: N/A;  . DILATION AND CURETTAGE OF UTERUS    . hysteroscopic polyp resection    . KNEE ARTHROSCOPY    . POLYPECTOMY    . REFRACTIVE SURGERY     related to hole in back of left eye as stated per pt   . RENAL BIOPSY    . ROBOTIC ASSISTED TOTAL HYSTERECTOMY WITH BILATERAL SALPINGO OOPHERECTOMY Bilateral 05/13/2015   Procedure: XI ROBOTIC ASSISTED TOTAL HYSTERECTOMY WITH BILATERAL SALPINGO OOPHORECTOMY;  Surgeon: Janie Morning, MD;  [facility];  Service: Gynecology;  Laterality: Bilateral;  . TOTAL KNEE ARTHROPLASTY Left 05/04/2014   Procedure: LEFT TOTAL KNEE ARTHROPLASTY;  Surgeon: Mauri Pole, MD;  Location: [facility];  Service: Orthopedics;  Laterality: Left;  . TUBAL LIGATION      Current Outpatient Medications  Medication Sig Dispense Refill  . levothyroxine (SYNTHROID) 50 MCG tablet Take 1 tablet (50 mcg total) by mouth daily. 90 tablet 0  . losartan-hydrochlorothiazide (HYZAAR) 50-12.5 MG tablet TAKE 1 TABLET BY MOUTH EVERY DAY 90 tablet 3  .  Multiple Vitamin (MULTIVITAMIN) tablet Take 1 tablet by mouth every morning.     . pantoprazole (PROTONIX) 40 MG tablet TAKE 1 TABLET EVERY MORNING 90 tablet 1  . predniSONE (DELTASONE) 5 MG tablet Take 15 mg daily for 2 weeks, then 12.5 mg daily for 2 weeks, then 10 mg daily for 2 weeks, then 7.5 mg daily for 4 weeks, then 5 mg daily thereafter    . verapamil (CALAN) 120 MG tablet TAKE 1 TABLET DAILY 90 tablet 1   Current Facility-Administered Medications  Medication Dose Route Frequency Provider Last Rate Last Admin  . 0.9 %  sodium chloride infusion  500 mL Intravenous Continuous Nandigam, Venia Minks, MD        Family History  Problem Relation Age of Onset  . Hypertension Mother   .  Liver cancer Mother   . Heart disease Father   . Hypertension Father   . Diabetes Maternal Grandmother   . Cancer Maternal Aunt   . Breast cancer Neg Hx   . Colon cancer Neg Hx   . Esophageal cancer Neg Hx   . Stomach cancer Neg Hx     Review of Systems  Constitutional: Negative.   HENT: Negative.   Eyes: Negative.   Respiratory: Negative.   Cardiovascular: Negative.   Gastrointestinal: Negative.   Endocrine: Negative.   Genitourinary: Negative.   Musculoskeletal: Negative.   Skin: Negative.   Allergic/Immunologic: Negative.   Neurological: Negative.   Hematological: Negative.   Psychiatric/Behavioral: Negative.     Exam:   BP 120/72   Pulse 70   Resp 16   Ht 5\' 7"  (1.702 m)   Wt 280 lb (127 kg)   LMP 11/21/2009   BMI 43.85 kg/m   Height: 5\' 7"  (170.2 cm)  General appearance: alert, cooperative and appears stated age Head: Normocephalic, without obvious abnormality, atraumatic Neck: no adenopathy, supple, symmetrical, trachea midline and thyroid normal to inspection and palpation Lungs: clear to auscultation bilaterally Breasts: normal appearance, no masses or tenderness Heart: regular rate and rhythm Abdomen: soft, non-tender; bowel sounds normal; no masses,  no organomegaly Extremities: extremities normal, atraumatic, no cyanosis or edema Skin: Skin color, texture, turgor normal. No rashes or lesions Lymph nodes: Cervical, supraclavicular, and axillary nodes normal. No abnormal inguinal nodes palpated Neurologic: Grossly normal   Pelvic: External genitalia:  no lesions              Urethra:  normal appearing urethra with no masses, tenderness or lesions              Bartholins and Skenes: normal                 Vagina: normal appearing vagina with normal color and discharge, no lesions              Cervix: absent              Pap taken: No. Bimanual Exam:  Uterus:  uterus absent              Adnexa: no mass, fullness, tenderness                Rectovaginal: Confirms               Anus:  normal sphincter tone, no lesions  Chaperone, Royal Hawthorn, CMA, was present for exam.  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 Denman George  Hypertension ANCA associated  vasculitis Hypothyroidism  Vaginal odor  P:   Mammogram guidelines reviewed.  Doing yearly. pap smear not indicated Pt is going to check about BMD coverage.  Will let me know if she wants me to have order this for 2021 year. Colonoscopy 2019.  Follow up 5 years.   Had lots of blood work done this year return annually or prn

## 2020-04-23 ENCOUNTER — Ambulatory Visit (INDEPENDENT_AMBULATORY_CARE_PROVIDER_SITE_OTHER): Payer: No Typology Code available for payment source | Admitting: Obstetrics & Gynecology

## 2020-04-23 ENCOUNTER — Encounter: Payer: Self-pay | Admitting: Obstetrics & Gynecology

## 2020-04-23 ENCOUNTER — Other Ambulatory Visit: Payer: Self-pay

## 2020-04-23 VITALS — BP 120/72 | HR 70 | Resp 16 | Ht 67.0 in | Wt 280.0 lb

## 2020-04-23 DIAGNOSIS — I7782 Antineutrophilic cytoplasmic antibody (ANCA) vasculitis: Secondary | ICD-10-CM

## 2020-04-23 DIAGNOSIS — Z01419 Encounter for gynecological examination (general) (routine) without abnormal findings: Secondary | ICD-10-CM

## 2020-04-23 DIAGNOSIS — N898 Other specified noninflammatory disorders of vagina: Secondary | ICD-10-CM

## 2020-04-23 DIAGNOSIS — I776 Arteritis, unspecified: Secondary | ICD-10-CM | POA: Diagnosis not present

## 2020-04-23 NOTE — Patient Instructions (Signed)
Safe douche:  1 tsp baking soda in 1 cup water   Hydrogen peroxide mixed 1:1 water

## 2020-04-24 LAB — VAGINITIS/VAGINOSIS, DNA PROBE
Candida Species: NEGATIVE
Gardnerella vaginalis: NEGATIVE
Trichomonas vaginosis: NEGATIVE

## 2020-06-16 ENCOUNTER — Other Ambulatory Visit: Payer: Self-pay | Admitting: Family Medicine

## 2020-09-22 ENCOUNTER — Other Ambulatory Visit: Payer: Self-pay

## 2020-09-23 ENCOUNTER — Other Ambulatory Visit: Payer: Self-pay | Admitting: Family Medicine

## 2020-09-23 ENCOUNTER — Encounter: Payer: Self-pay | Admitting: Family Medicine

## 2020-09-23 ENCOUNTER — Ambulatory Visit: Payer: No Typology Code available for payment source | Admitting: Family Medicine

## 2020-09-23 VITALS — BP 126/78 | HR 99 | Temp 98.4°F | Wt 287.8 lb

## 2020-09-23 DIAGNOSIS — R600 Localized edema: Secondary | ICD-10-CM | POA: Diagnosis not present

## 2020-09-23 DIAGNOSIS — Z23 Encounter for immunization: Secondary | ICD-10-CM | POA: Diagnosis not present

## 2020-09-23 DIAGNOSIS — G629 Polyneuropathy, unspecified: Secondary | ICD-10-CM

## 2020-09-23 DIAGNOSIS — E669 Obesity, unspecified: Secondary | ICD-10-CM

## 2020-09-23 DIAGNOSIS — I10 Essential (primary) hypertension: Secondary | ICD-10-CM | POA: Diagnosis not present

## 2020-09-23 LAB — BASIC METABOLIC PANEL
BUN: 23 mg/dL (ref 6–23)
CO2: 32 mEq/L (ref 19–32)
Calcium: 10 mg/dL (ref 8.4–10.5)
Chloride: 101 mEq/L (ref 96–112)
Creatinine, Ser: 0.75 mg/dL (ref 0.40–1.20)
GFR: 84.92 mL/min (ref >60.00)
Glucose, Bld: 98 mg/dL (ref 70–99)
Potassium: 4.1 mEq/L (ref 3.5–5.1)
Sodium: 141 mEq/L (ref 135–145)

## 2020-09-23 LAB — VITAMIN B12: Vitamin B-12: 451 pg/mL (ref 211–911)

## 2020-09-23 LAB — T4, FREE: Free T4: 0.73 ng/dL (ref 0.60–1.60)

## 2020-09-23 LAB — TSH: TSH: 2.92 u[IU]/mL (ref 0.35–4.50)

## 2020-09-23 LAB — T3, FREE: T3, Free: 3.4 pg/mL (ref 2.3–4.2)

## 2020-09-23 LAB — HEMOGLOBIN A1C: Hgb A1c MFr Bld: 5.8 % (ref 4.6–6.5)

## 2020-09-23 MED ORDER — GABAPENTIN 100 MG PO CAPS: 100.0000 mg | ORAL_CAPSULE | Freq: Every day | ORAL | 3 refills | Status: DC

## 2020-09-23 NOTE — Progress Notes (Signed)
° °  Subjective:    Patient ID: Gabriela Shepherd, female    DOB: 12/22/1955, 65 y.o.   MRN: [id number]  HPI Here to discuss burning in both feet and lower legs that really bothers her at night. This has been going on about 2 years, but it has become more intense over the past 3 months. Ibuprofen seems to help a little. She has chronic swelling in the legs as the result of obesity and venous insufficiency. She saw a cardiologist at [facility] in December and he did an ECHO on her which was normal (EF was 60-65%). No SOB. He started her on Lasix which has help ed the swelling somewhat.    Review of Systems  Constitutional: Negative.   Respiratory: Negative.   Cardiovascular: Positive for leg swelling. Negative for chest pain and palpitations.       Objective:   Physical Exam Constitutional:      Appearance: She is obese.  Cardiovascular:     Rate and Rhythm: Normal rate and regular rhythm.     Pulses: Normal pulses.     Heart sounds: Normal heart sounds.  Pulmonary:     Effort: Pulmonary effort is normal.     Breath sounds: Normal breath sounds.  Musculoskeletal:     Comments: Both lower extremities have 3+ edema. No ertythema or warmth. Both lower legs are tender to palpate.  Neurological:     Mental Status: She is alert.           Assessment & Plan:  She has venous insufficiency which leads to chronic edema on the legs. The Lasix helps a little. The best long term treatment of course would be weight loss. For the neuropathy we will get labs to check B12, etc. She will try Gabapentin 100 mg at bedtime.  Alysia Penna, MD

## 2020-10-06 ENCOUNTER — Encounter: Payer: Self-pay | Admitting: Family Medicine

## 2020-10-06 MED ORDER — GABAPENTIN 300 MG PO CAPS: 300.0000 mg | ORAL_CAPSULE | Freq: Every day | ORAL | 5 refills | Status: DC

## 2020-10-06 NOTE — Telephone Encounter (Signed)
We will increase the dose to 300 mg at bedtime. I sent in a new rx

## 2020-10-24 ENCOUNTER — Encounter: Payer: Self-pay | Admitting: Family Medicine

## 2020-10-25 NOTE — Telephone Encounter (Signed)
Make her an in person OV

## 2020-10-27 ENCOUNTER — Encounter: Payer: Self-pay | Admitting: Family Medicine

## 2020-10-27 ENCOUNTER — Ambulatory Visit: Payer: No Typology Code available for payment source | Admitting: Family Medicine

## 2020-10-27 ENCOUNTER — Other Ambulatory Visit: Payer: Self-pay

## 2020-10-27 VITALS — BP 110/72 | HR 78 | Temp 98.3°F | Wt 284.0 lb

## 2020-10-27 DIAGNOSIS — H6982 Other specified disorders of Eustachian tube, left ear: Secondary | ICD-10-CM | POA: Diagnosis not present

## 2020-10-27 NOTE — Progress Notes (Signed)
   Subjective:    Patient ID: Gabriela Shepherd, female    DOB: 02/02/1956, 65 y.o.   MRN: [id number]  HPI Here for 3 weeks of intermittent muffled hearing in the left ear and she can often hear her heart beat in that ear. No pain or dizziness. She tends to have some allergy problems every spring.    Review of Systems  Constitutional: Negative.   HENT: Positive for hearing loss and sinus pressure. Negative for congestion, ear discharge, ear pain, postnasal drip, rhinorrhea and sore throat.   Eyes: Negative.   Respiratory: Negative.   Cardiovascular: Negative.        Objective:   Physical Exam Constitutional:      Appearance: Normal appearance. She is not ill-appearing.  HENT:     Right Ear: Tympanic membrane, ear canal and external ear normal.     Left Ear: Tympanic membrane, ear canal and external ear normal.     Nose: No congestion.     Mouth/Throat:     Pharynx: Oropharynx is clear.  Eyes:     Conjunctiva/sclera: Conjunctivae normal.  Cardiovascular:     Rate and Rhythm: Normal rate and regular rhythm.     Pulses: Normal pulses.     Heart sounds: Normal heart sounds.  Pulmonary:     Effort: Pulmonary effort is normal.     Breath sounds: Normal breath sounds.  Lymphadenopathy:     Cervical: No cervical adenopathy.  Neurological:     Mental Status: She is alert.           Assessment & Plan:  Allergies with eustachian tube dysfunction. She will try Zyrtec 10 mg and Flonase sprays every day, at least through the pollen season.  Alysia Penna, MD

## 2020-11-22 ENCOUNTER — Other Ambulatory Visit: Payer: Self-pay | Admitting: Obstetrics & Gynecology

## 2020-11-22 DIAGNOSIS — Z1231 Encounter for screening mammogram for malignant neoplasm of breast: Secondary | ICD-10-CM

## 2020-12-06 ENCOUNTER — Other Ambulatory Visit: Payer: Self-pay | Admitting: Family Medicine

## 2020-12-08 ENCOUNTER — Encounter: Payer: Self-pay | Admitting: Family Medicine

## 2020-12-08 ENCOUNTER — Other Ambulatory Visit: Payer: Self-pay

## 2020-12-08 MED ORDER — LOSARTAN POTASSIUM-HCTZ 50-12.5 MG PO TABS: 1.0000 | ORAL_TABLET | Freq: Every day | ORAL | 1 refills | Status: DC

## 2020-12-08 MED ORDER — LEVOTHYROXINE SODIUM 50 MCG PO TABS: 50.0000 ug | ORAL_TABLET | Freq: Every day | ORAL | 1 refills | Status: DC

## 2020-12-09 ENCOUNTER — Other Ambulatory Visit: Payer: Self-pay | Admitting: Family Medicine

## 2021-01-04 ENCOUNTER — Encounter: Payer: Self-pay | Admitting: Family Medicine

## 2021-01-05 NOTE — Telephone Encounter (Signed)
Call in a 4 mg Medrol dose pack to take as directed

## 2021-01-06 ENCOUNTER — Other Ambulatory Visit: Payer: Self-pay

## 2021-01-06 MED ORDER — METHYLPREDNISOLONE 4 MG PO TBPK
ORAL_TABLET | ORAL | 0 refills | Status: DC
Start: 2021-01-06 — End: 2021-05-23

## 2021-01-06 NOTE — Telephone Encounter (Signed)
Spoke with pt aware that Rx for Medrol pack was sent to her pharmacy, verbalized understanding

## 2021-01-13 ENCOUNTER — Encounter: Payer: Self-pay | Admitting: Family Medicine

## 2021-01-13 DIAGNOSIS — H9202 Otalgia, left ear: Secondary | ICD-10-CM

## 2021-01-13 NOTE — Telephone Encounter (Signed)
I put in an urgent referral to ENT for this

## 2021-02-10 ENCOUNTER — Other Ambulatory Visit: Payer: Self-pay

## 2021-02-10 ENCOUNTER — Ambulatory Visit
Admission: RE | Admit: 2021-02-10 | Discharge: 2021-02-10 | Disposition: A | Discharge status: Home / Self Care | Payer: No Typology Code available for payment source | Source: Ambulatory Visit

## 2021-02-10 DIAGNOSIS — Z1231 Encounter for screening mammogram for malignant neoplasm of breast: Secondary | ICD-10-CM

## 2021-02-19 ENCOUNTER — Encounter: Payer: Self-pay | Admitting: Family Medicine

## 2021-02-21 MED ORDER — GABAPENTIN 300 MG PO CAPS
600.0000 mg | ORAL_CAPSULE | Freq: Every day | ORAL | 5 refills | Status: DC
Start: 2021-02-21 — End: 2021-06-07

## 2021-02-21 NOTE — Telephone Encounter (Signed)
We will increase the dose ot 600 mg (2 capsules) at bedtime

## 2021-02-23 ENCOUNTER — Other Ambulatory Visit: Payer: Self-pay

## 2021-02-23 ENCOUNTER — Ambulatory Visit (INDEPENDENT_AMBULATORY_CARE_PROVIDER_SITE_OTHER): Payer: No Typology Code available for payment source | Admitting: Otolaryngology

## 2021-02-23 DIAGNOSIS — H938X2 Other specified disorders of left ear: Secondary | ICD-10-CM | POA: Diagnosis not present

## 2021-02-23 NOTE — Progress Notes (Signed)
HPI: Gabriela Shepherd is a 65 y.o. female who presents is referred by her PCP for evaluation of left ear complaints.  She feels like the left ear becomes blocked at times.  She has been treated with a course of steroids as well as allergy medication without relief of symptoms.  She has no pain or soreness in the ear.. The blockage sensation in the left ear varies from day-to-day.  She notices it some mornings when she wakes and if it occurs in the morning able to persist throughout the day.  Some mornings she does not  notice the blockage that much.  It is always on the left side.  Past Medical History:  Diagnosis Date   Allergic rhinitis    sees Dr. Annamaria Boots   ANCA-associated vasculitis [facility])    Rheumatologist:  Dr. Arcelia Jew, [facility]   Anxiety    Asthma    sees Dr. Annamaria Boots   Basal cell carcinoma 2020   abdomen - right    Carpal tunnel syndrome    sees Dr. Amedeo Plenty   GERD (gastroesophageal reflux disease)    Headache(784.0)    hx of pt states BP meds has improved    Hypertension    Osteoarthritis    Thyroid disease    Past Surgical History:  Procedure Laterality Date   BASAL CELL CARCINOMA EXCISION  2020   abdomen - right    BUNIONECTOMY     CARPAL TUNNEL RELEASE     CERVICAL POLYPECTOMY N/A 04/05/2015   Procedure: CERVICAL POLYPECTOMY removal ;  Surgeon: Megan Salon, MD;  Location: [facility];  Service: Gynecology;  Laterality: N/A;   COLONOSCOPY  01/04/2018   per Dr. Silverio Decamp, adenmatous polyps, repeat in 5 yrs    [place] N/A 04/05/2015   Procedure: DILATATION & CURETTAGE/HYSTEROSCOPY ;  Surgeon: Megan Salon, MD;  Location: [place] [facility];  Service: Gynecology;  Laterality: N/A;   DILATION AND CURETTAGE OF UTERUS     hysteroscopic polyp resection     KNEE ARTHROSCOPY     POLYPECTOMY     REFRACTIVE SURGERY     related to hole in back of left eye as stated per pt    RENAL BIOPSY     ROBOTIC ASSISTED TOTAL HYSTERECTOMY WITH BILATERAL SALPINGO  OOPHERECTOMY Bilateral 05/13/2015   Procedure: XI ROBOTIC ASSISTED TOTAL HYSTERECTOMY WITH BILATERAL SALPINGO OOPHORECTOMY;  Surgeon: Janie Morning, MD;  [facility]: [facility];  Service: Gynecology;  Laterality: Bilateral;   TOTAL KNEE ARTHROPLASTY Left 05/04/2014   Procedure: LEFT TOTAL KNEE ARTHROPLASTY;  Surgeon: Mauri Pole, MD;  [facility]: [facility];  Service: Orthopedics;  Laterality: Left;   TUBAL LIGATION     Social History   Socioeconomic History   Marital status: Single    Spouse name: Not on file   Number of children: Not on file   Years of education: Not on file   Highest education level: Not on file  Occupational History   Not on file  Tobacco Use   Smoking status: Never   Smokeless tobacco: Never  Vaping Use   Vaping Use: Never used  Substance and Sexual Activity   Alcohol use: Not Currently   Drug use: No   Sexual activity: Not Currently    Partners: Male    Birth control/protection: Surgical, Post-menopausal    Comment: BTL  Other Topics Concern   Not on file  Social History Narrative   Not on file   Social Determinants of Health   Financial Resource  Strain: Not on file  Food Insecurity: Not on file  Transportation Needs: Not on file  Physical Activity: Not on file  Stress: Not on file  Social Connections: Not on file   Family History  Problem Relation Age of Onset   Hypertension Mother    Liver cancer Mother    Heart disease Father    Hypertension Father    Diabetes Maternal Grandmother    Cancer Maternal Aunt    Breast cancer Neg Hx    Colon cancer Neg Hx    Esophageal cancer Neg Hx    Stomach cancer Neg Hx    Allergies  Allergen Reactions   Amoxicillin Itching and Rash    Has patient had a PCN reaction causing immediate rash, facial/tongue/throat swelling, SOB or lightheadedness with hypotension: no Has patient had a PCN reaction causing severe rash involving mucus membranes or skin necrosis: no Has patient had a PCN reaction that required  hospitalization no Has patient had a PCN reaction occurring within the last 10 years: no If all of the above answers are "NO", then may proceed with Cephalosporin use.     Penicillins Rash    Has patient had a PCN reaction causing immediate rash, facial/tongue/throat swelling, SOB or lightheadedness with hypotension: no Has patient had a PCN reaction causing severe rash involving mucus membranes or skin necrosis: no Has patient had a PCN reaction that required hospitalization no Has patient had a PCN reaction occurring within the last 10 years: no If all of the above answers are "NO", then may proceed with Cephalosporin use.    Prior to Admission medications   Medication Sig Start Date End Date Taking? Authorizing Provider  furosemide (LASIX) 20 MG tablet Take 20 mg by mouth daily.    [provider]  gabapentin (NEURONTIN) 300 MG capsule Take 2 capsules (600 mg total) by mouth at bedtime. 02/21/21   Laurey Morale, MD  levothyroxine (SYNTHROID) 50 MCG tablet Take 1 tablet (50 mcg total) by mouth daily. 12/08/20   Laurey Morale, MD  losartan-hydrochlorothiazide (HYZAAR) 50-12.5 MG tablet Take 1 tablet by mouth daily. 12/08/20   Laurey Morale, MD  methylPREDNISolone (MEDROL DOSEPAK) 4 MG TBPK tablet Take as directed 01/06/21   Laurey Morale, MD  Multiple Vitamin (MULTIVITAMIN) tablet Take 1 tablet by mouth every morning.     [provider]  pantoprazole (PROTONIX) 40 MG tablet TAKE 1 TABLET EVERY MORNING 12/09/20   Laurey Morale, MD  verapamil (CALAN) 120 MG tablet TAKE 1 TABLET DAILY 12/23/19   Laurey Morale, MD     Positive ROS: Otherwise negative  All other systems have been reviewed and were otherwise negative with the exception of those mentioned in the HPI and as above.  Physical Exam: Constitutional: Alert, well-appearing, no acute distress Ears: External ears without lesions or tenderness. Ear canals are clear bilaterally.  Left TM is clear with good mobility on  pneumatic otoscopy and no middle ear space abnormality noted. Nasal: External nose without lesions. Septum relatively midline with mild rhinitis.  No polyps.  Both middle meatus regions are clear.  No signs of infection..  Nasal passages otherwise clear. Oral: Lips and gums without lesions. Tongue and palate mucosa without lesions. Posterior oropharynx clear.  Tonsils are small and symmetric bilaterally. Neck: No palpable adenopathy or masses.  No TMJ pain or discomfort. Respiratory: Breathing comfortably  Skin: No facial/neck lesions or rash noted.  Audiologic testing demonstrated symmetric hearing in both ears with SRT's  of 15 dB on the right and 10 dB on the left.  She had type A tympanograms bilaterally.  Of note she does have some mild high-frequency sensorineural hearing loss in both ears above 6000 frequency.  Procedures  Assessment: Questionable etiology of "left ear fullness" Normal clinical examination and audiologic testing.  Plan: Is possible that this could be related to her GE reflux disease for which she takes Protonix.  Since symptoms initially are noted in the morning when she wakes up she possibly could have experienced more "silent reflux" the night before.  I suggested taking her Protonix before dinner for 3 to 4 weeks instead of first thing in the morning as this will provide better nighttime coverage of reflux. She has previously used nasal steroid spray without any significant benefit.  And on exam today there is no middle ear space abnormality noted on clinical exam or on tympanometry.   Radene Journey, MD   CC:

## 2021-03-22 ENCOUNTER — Encounter (INDEPENDENT_AMBULATORY_CARE_PROVIDER_SITE_OTHER): Payer: Self-pay

## 2021-04-04 ENCOUNTER — Encounter: Payer: Self-pay | Admitting: Family Medicine

## 2021-04-05 NOTE — Telephone Encounter (Signed)
Dr. Lucia Gaskins suggested she take her Protonix at bedtime instead of in the mornings. Has she tried this?

## 2021-05-23 ENCOUNTER — Other Ambulatory Visit: Payer: Self-pay

## 2021-05-23 ENCOUNTER — Ambulatory Visit (INDEPENDENT_AMBULATORY_CARE_PROVIDER_SITE_OTHER): Payer: No Typology Code available for payment source | Admitting: Obstetrics & Gynecology

## 2021-05-23 ENCOUNTER — Encounter (HOSPITAL_BASED_OUTPATIENT_CLINIC_OR_DEPARTMENT_OTHER): Payer: Self-pay | Admitting: Obstetrics & Gynecology

## 2021-05-23 VITALS — BP 145/77 | HR 89 | Ht 68.0 in | Wt 282.8 lb

## 2021-05-23 DIAGNOSIS — Z23 Encounter for immunization: Secondary | ICD-10-CM | POA: Diagnosis not present

## 2021-05-23 DIAGNOSIS — Z90710 Acquired absence of both cervix and uterus: Secondary | ICD-10-CM

## 2021-05-23 DIAGNOSIS — I10 Essential (primary) hypertension: Secondary | ICD-10-CM

## 2021-05-23 DIAGNOSIS — Z6841 Body Mass Index (BMI) 40.0 and over, adult: Secondary | ICD-10-CM

## 2021-05-23 DIAGNOSIS — Z01419 Encounter for gynecological examination (general) (routine) without abnormal findings: Secondary | ICD-10-CM | POA: Diagnosis not present

## 2021-05-23 DIAGNOSIS — E039 Hypothyroidism, unspecified: Secondary | ICD-10-CM

## 2021-05-23 NOTE — Progress Notes (Signed)
65 y.o. G54P3003 Single White or Caucasian female here for annual exam.  Denies vaginal bleeding.    Patient's last menstrual period was 11/21/2009.          Sexually active: No.  The current method of family planning is status post hysterectomy.    Smoker:  no  Health Maintenance: Pap:  12/10/2014 Negative History of abnormal Pap:  remote hx MMG:  02/10/2021 Negative Colonoscopy:  01/04/2018, follow up 4 years BMD:   plan to do at age 65 Screening Labs: done in 04/2021 and 01/2021   reports that she has never smoked. She has never used smokeless tobacco. She reports that she does not currently use alcohol. She reports that she does not use drugs.  Past Medical History:  Diagnosis Date   Allergic rhinitis    sees Dr. Annamaria Boots   ANCA-associated vasculitis    Rheumatologist:  Dr. Arcelia Jew, [facility]   Anxiety    Asthma    sees Dr. Annamaria Boots   Basal cell carcinoma 2020   abdomen - right    Carpal tunnel syndrome    sees Dr. Amedeo Plenty   GERD (gastroesophageal reflux disease)    Headache(784.0)    hx of pt states BP meds has improved    Hypertension    Osteoarthritis    Thyroid disease     Past Surgical History:  Procedure Laterality Date   BASAL CELL CARCINOMA EXCISION  2020   abdomen - right    BUNIONECTOMY     CARPAL TUNNEL RELEASE     CERVICAL POLYPECTOMY N/A 04/05/2015   Procedure: CERVICAL POLYPECTOMY removal ;  Surgeon: Megan Salon, MD;  Location: [place] [facility];  Service: Gynecology;  Laterality: N/A;   COLONOSCOPY  01/04/2018   per Dr. Silverio Decamp, adenmatous polyps, repeat in 5 yrs    [place] N/A 04/05/2015   Procedure: DILATATION & CURETTAGE/HYSTEROSCOPY ;  Surgeon: Megan Salon, MD;  Location: [place] [facility];  Service: Gynecology;  Laterality: N/A;   DILATION AND CURETTAGE OF UTERUS     hysteroscopic polyp resection     KNEE ARTHROSCOPY     POLYPECTOMY     REFRACTIVE SURGERY     related to hole in back of left eye as stated per pt    RENAL BIOPSY      ROBOTIC ASSISTED TOTAL HYSTERECTOMY WITH BILATERAL SALPINGO OOPHERECTOMY Bilateral 05/13/2015   Procedure: XI ROBOTIC ASSISTED TOTAL HYSTERECTOMY WITH BILATERAL SALPINGO OOPHORECTOMY;  Surgeon: Janie Morning, MD;  [facility];  Service: Gynecology;  Laterality: Bilateral;   TOTAL KNEE ARTHROPLASTY Left 05/04/2014   Procedure: LEFT TOTAL KNEE ARTHROPLASTY;  Surgeon: Mauri Pole, MD;  [facility];  Service: Orthopedics;  Laterality: Left;   TUBAL LIGATION      Current Outpatient Medications  Medication Sig Dispense Refill   furosemide (LASIX) 20 MG tablet Take 20 mg by mouth daily.     gabapentin (NEURONTIN) 300 MG capsule Take 2 capsules (600 mg total) by mouth at bedtime. 60 capsule 5   levothyroxine (SYNTHROID) 50 MCG tablet Take 1 tablet (50 mcg total) by mouth daily. 90 tablet 1   losartan-hydrochlorothiazide (HYZAAR) 50-12.5 MG tablet Take 1 tablet by mouth daily. 90 tablet 1   Multiple Vitamin (MULTIVITAMIN) tablet Take 1 tablet by mouth every morning.      pantoprazole (PROTONIX) 40 MG tablet TAKE 1 TABLET EVERY MORNING 90 tablet 1   verapamil (CALAN) 120 MG tablet TAKE 1 TABLET DAILY 90 tablet 1   Current Facility-Administered  Medications  Medication Dose Route Frequency Provider Last Rate Last Admin   0.9 %  sodium chloride infusion  500 mL Intravenous Continuous Nandigam, Venia Minks, MD        Family History  Problem Relation Age of Onset   Hypertension Mother    Liver cancer Mother    Heart disease Father    Hypertension Father    Diabetes Maternal Grandmother    Cancer Maternal Aunt    Breast cancer Neg Hx    Colon cancer Neg Hx    Esophageal cancer Neg Hx    Stomach cancer Neg Hx     Review of Systems  All other systems reviewed and are negative.  Exam:   BP (!) 145/77 (BP Location: Left Arm, Patient Position: Sitting, Cuff Size: Large)   Pulse 89   Ht 5\' 8"  (1.727 m) Comment: reported  Wt 282 lb 12.8 oz (128.3 kg)   LMP 11/21/2009   BMI 43.00  kg/m   Height: 5\' 8"  (172.7 cm) (reported)  General appearance: alert, cooperative and appears stated age Head: Normocephalic, without obvious abnormality, atraumatic Neck: no adenopathy, supple, symmetrical, trachea midline and thyroid normal to inspection and palpation Lungs: clear to auscultation bilaterally Breasts: normal appearance, no masses or tenderness Heart: regular rate and rhythm Abdomen: soft, non-tender; bowel sounds normal; no masses,  no organomegaly Extremities: extremities normal, atraumatic, no cyanosis or edema Skin: Skin color, texture, turgor normal. No rashes or lesions Lymph nodes: Cervical, supraclavicular, and axillary nodes normal. No abnormal inguinal nodes palpated Neurologic: Grossly normal   Pelvic: External genitalia:  no lesions              Urethra:  normal appearing urethra with no masses, tenderness or lesions              Bartholins and Skenes: normal                 Vagina: normal appearing vagina with normal color and no discharge, no lesions              Cervix: absent              Pap taken: No. Bimanual Exam:  Uterus:  uterus absent              Adnexa: no mass, fullness, tenderness               Rectovaginal: Confirms               Anus:  normal sphincter tone, no lesions  Chaperone, Octaviano Batty, CMA, was present for exam.  Assessment/Plan: 1. Well woman exam with routine gynecological exam - pap smear not indicated - colonoscopy 12/2017, follow up 4 years - MMG 01/2021 - BMD, plan to do at age 65 - screening lab work done 7/22 and 10/22 -Care gaps reviewed/updated - Flu Vaccine QUAD 36+ mos IM (Fluarix, Quad PF)  2. H/O: hysterectomy - TLH/BSO with Dr. Denman George due to complex endometrial hyperplasia with atypia  3. BMI 40.0-44.9, adult ([place])  4. Primary hypertension  5. Hypothyroidism, unspecified type

## 2021-05-26 DIAGNOSIS — Z90710 Acquired absence of both cervix and uterus: Secondary | ICD-10-CM | POA: Insufficient documentation

## 2021-06-01 ENCOUNTER — Encounter: Payer: Self-pay | Admitting: Family Medicine

## 2021-06-02 NOTE — Telephone Encounter (Signed)
Yes it would be fine for her to take this

## 2021-06-04 ENCOUNTER — Other Ambulatory Visit: Payer: Self-pay | Admitting: Family Medicine

## 2021-06-07 ENCOUNTER — Encounter: Payer: Self-pay | Admitting: Podiatry

## 2021-06-07 ENCOUNTER — Other Ambulatory Visit: Payer: Self-pay | Admitting: Family Medicine

## 2021-06-07 ENCOUNTER — Other Ambulatory Visit: Payer: Self-pay

## 2021-06-07 ENCOUNTER — Ambulatory Visit: Payer: No Typology Code available for payment source

## 2021-06-07 ENCOUNTER — Ambulatory Visit (INDEPENDENT_AMBULATORY_CARE_PROVIDER_SITE_OTHER): Payer: No Typology Code available for payment source | Admitting: Podiatry

## 2021-06-07 DIAGNOSIS — M778 Other enthesopathies, not elsewhere classified: Secondary | ICD-10-CM

## 2021-06-07 DIAGNOSIS — G5790 Unspecified mononeuropathy of unspecified lower limb: Secondary | ICD-10-CM | POA: Diagnosis not present

## 2021-06-07 DIAGNOSIS — L603 Nail dystrophy: Secondary | ICD-10-CM | POA: Diagnosis not present

## 2021-06-07 MED ORDER — GABAPENTIN 300 MG PO CAPS: ORAL_CAPSULE | ORAL | 3 refills | Status: DC

## 2021-06-07 NOTE — Progress Notes (Addendum)
Subjective:  Patient ID: Gabriela Shepherd, female    DOB: 1956/07/08,  MRN: [id number] HPI Chief Complaint  Patient presents with   Foot Pain    Plantar forefoot bilateral - padding feels thick, electrical sensations through toes, ongoing x years, seen a few docs but no real treatment, takes gabapentin for leg pain, tried soaking, lotions, naftin gel, hammertoe deformities, also concerned about the toenails thickening   New Patient (Initial Visit)    65 y.o. female presents with the above complaint.   ROS: Denies fever chills nausea vomiting muscle aches pains calf pain back pain chest pain shortness of breath.  Past Medical History:  Diagnosis Date   Allergic rhinitis    sees Dr. Annamaria Boots   ANCA-associated vasculitis    Rheumatologist:  Dr. Arcelia Jew, [facility]   Anxiety    Asthma    sees Dr. Annamaria Boots   Basal cell carcinoma 2020   abdomen - right    Carpal tunnel syndrome    sees Dr. Amedeo Plenty   GERD (gastroesophageal reflux disease)    Headache(784.0)    hx of pt states BP meds has improved    Hypertension    Osteoarthritis    Thyroid disease    Past Surgical History:  Procedure Laterality Date   BASAL CELL CARCINOMA EXCISION  2020   abdomen - right    BUNIONECTOMY     CARPAL TUNNEL RELEASE     CERVICAL POLYPECTOMY N/A 04/05/2015   Procedure: CERVICAL POLYPECTOMY removal ;  Surgeon: Megan Salon, MD;  Location: [facility];  Service: Gynecology;  Laterality: N/A;   COLONOSCOPY  01/04/2018   per Dr. Silverio Decamp, adenmatous polyps, repeat in 5 yrs    [place] N/A 04/05/2015   Procedure: DILATATION & CURETTAGE/HYSTEROSCOPY ;  Surgeon: Megan Salon, MD;  Location: [facility];  Service: Gynecology;  Laterality: N/A;   DILATION AND CURETTAGE OF UTERUS     hysteroscopic polyp resection     KNEE ARTHROSCOPY     POLYPECTOMY     REFRACTIVE SURGERY     related to hole in back of left eye as stated per pt    RENAL BIOPSY     ROBOTIC ASSISTED TOTAL  HYSTERECTOMY WITH BILATERAL SALPINGO OOPHERECTOMY Bilateral 05/13/2015   Procedure: XI ROBOTIC ASSISTED TOTAL HYSTERECTOMY WITH BILATERAL SALPINGO OOPHORECTOMY;  Surgeon: Janie Morning, MD;  Location: [facility];  Service: Gynecology;  Laterality: Bilateral;   TOTAL KNEE ARTHROPLASTY Left 05/04/2014   Procedure: LEFT TOTAL KNEE ARTHROPLASTY;  Surgeon: Mauri Pole, MD;  Location: [facility];  Service: Orthopedics;  Laterality: Left;   TUBAL LIGATION      Current Outpatient Medications:    gabapentin (NEURONTIN) 300 MG capsule, Take 300mg  AM, 300mg  PM and 600mg  QHS, Disp: 360 capsule, Rfl: 3   furosemide (LASIX) 20 MG tablet, Take 20 mg by mouth daily., Disp: , Rfl:    levothyroxine (SYNTHROID) 50 MCG tablet, TAKE 1 TABLET BY MOUTH EVERY DAY, Disp: 90 tablet, Rfl: 1   losartan-hydrochlorothiazide (HYZAAR) 50-12.5 MG tablet, TAKE 1 TABLET BY MOUTH EVERY DAY, Disp: 90 tablet, Rfl: 1   Multiple Vitamin (MULTIVITAMIN) tablet, Take 1 tablet by mouth every morning. , Disp: , Rfl:    pantoprazole (PROTONIX) 40 MG tablet, TAKE 1 TABLET EVERY MORNING, Disp: 90 tablet, Rfl: 1   verapamil (CALAN) 120 MG tablet, TAKE 1 TABLET DAILY, Disp: 90 tablet, Rfl: 1  Current Facility-Administered Medications:    0.9 %  sodium chloride infusion, 500 mL, Intravenous, Continuous,  Mauri Pole, MD  Allergies  Allergen Reactions   Amoxicillin Itching and Rash    Has patient had a PCN reaction causing immediate rash, facial/tongue/throat swelling, SOB or lightheadedness with hypotension: no Has patient had a PCN reaction causing severe rash involving mucus membranes or skin necrosis: no Has patient had a PCN reaction that required hospitalization no Has patient had a PCN reaction occurring within the last 10 years: no If all of the above answers are "NO", then may proceed with Cephalosporin use.     Penicillins Rash    Has patient had a PCN reaction causing immediate rash, facial/tongue/throat swelling, SOB or  lightheadedness with hypotension: no Has patient had a PCN reaction causing severe rash involving mucus membranes or skin necrosis: no Has patient had a PCN reaction that required hospitalization no Has patient had a PCN reaction occurring within the last 10 years: no If all of the above answers are "NO", then may proceed with Cephalosporin use.    Review of Systems Objective:  There were no vitals filed for this visit.  General: Well developed, nourished, in no acute distress, alert and oriented x3   Dermatological: Skin is warm, dry and supple bilateral. Nails x 10 are well maintained; remaining integument appears unremarkable at this time. There are no open sores, no preulcerative lesions, no rash or signs of infection present.  Toenails are thick yellow dystrophic clinically mycotic with subungual debris  Vascular: Dorsalis Pedis artery and Posterior Tibial artery pedal pulses are 2/4 bilateral with immedate capillary fill time. Pedal hair growth present. No varicosities and no lower extremity edema present bilateral.   Neruologic: Grossly intact via light touch bilateral. Vibratory intact via tuning fork bilateral. Protective threshold with Semmes Wienstein monofilament intact to all pedal sites bilateral. Patellar and Achilles deep tendon reflexes 2+ bilateral. No Babinski or clonus noted bilateral.   Musculoskeletal: No gross boney pedal deformities bilateral. No pain, crepitus, or limitation noted with foot and ankle range of motion bilateral. Muscular strength 5/5 in all groups tested bilateral.  Cavus foot deformities with hammertoe deformities flexible in nature with exception of the second toes bilaterally.  Gait: Unassisted, Nonantalgic.    Radiographs:  Radiographs taken today demonstrate an osseously mature individual soft tissue increase in density around the forefoot.  Hammertoe deformities noted.  Assessment & Plan:   Assessment: Hammertoe deformities bilateral.   Dystrophic dystrophic nails    Plan: Discussed the need for surgical intervention with her today in great detail she understands and is amendable to it I also went ahead and took samples of the nail to be sent for pathologic evaluation.  We also increased her gabapentin to 300 in the morning 300 at lunch and will continue the 600 at bedtime for the neuropathic sensation that she is having bilaterally.     Ahsley Attwood T. Wilson, [place]

## 2021-07-07 ENCOUNTER — Ambulatory Visit: Payer: No Typology Code available for payment source | Admitting: Podiatry

## 2021-08-04 ENCOUNTER — Encounter: Payer: Self-pay | Admitting: Podiatry

## 2021-08-04 ENCOUNTER — Other Ambulatory Visit: Payer: Self-pay

## 2021-08-04 ENCOUNTER — Ambulatory Visit: Payer: No Typology Code available for payment source | Admitting: Podiatry

## 2021-08-04 DIAGNOSIS — G5790 Unspecified mononeuropathy of unspecified lower limb: Secondary | ICD-10-CM | POA: Diagnosis not present

## 2021-08-04 DIAGNOSIS — L603 Nail dystrophy: Secondary | ICD-10-CM

## 2021-08-04 MED ORDER — TERBINAFINE HCL 250 MG PO TABS: 250.0000 mg | ORAL_TABLET | Freq: Every day | ORAL | 0 refills | Status: DC

## 2021-08-04 NOTE — Progress Notes (Signed)
She presents today for follow-up of her capsulitis and neuropathy bilaterally she is also here for follow-up of her toenails.  She does state that she would like to have surgery on the second toe left foot in the summertime and we also discussed the fact that she is improving with her increasing gabapentin.  Objective: Vital signs are stable alert and oriented x3 she still has pain on palpation of the second metatarsophalangeal joint of the left foot with hammertoe deformity.  She has positive onychomycosis with debridement of her pathology report.  Assessment: Pain in limb secondary to neuropathy hammertoe deformity capsulitis left foot.  Painful toenails 1 through 5 bilaterally with onychomycosis.  Plan: Currently we started her on Lamisil after discussing pros and cons and use of this medication and possible side effects associated with it she understands and is amendable to it she had blood work done in October which does demonstrate and normal healthy liver.  I wrote a prescription for terbinafine 250 mg tablets 1 p.o. daily and I will follow-up with her in 1 month.  We will discuss surgical intervention She comes in.

## 2021-08-31 ENCOUNTER — Other Ambulatory Visit: Payer: Self-pay | Admitting: Podiatry

## 2021-09-13 ENCOUNTER — Encounter: Payer: Self-pay | Admitting: Podiatry

## 2021-09-13 ENCOUNTER — Ambulatory Visit: Payer: No Typology Code available for payment source | Admitting: Podiatry

## 2021-09-13 ENCOUNTER — Other Ambulatory Visit: Payer: Self-pay

## 2021-09-13 DIAGNOSIS — G5793 Unspecified mononeuropathy of bilateral lower limbs: Secondary | ICD-10-CM

## 2021-09-13 DIAGNOSIS — Z79899 Other long term (current) drug therapy: Secondary | ICD-10-CM

## 2021-09-13 DIAGNOSIS — L603 Nail dystrophy: Secondary | ICD-10-CM | POA: Diagnosis not present

## 2021-09-13 LAB — COMPREHENSIVE METABOLIC PANEL
AG Ratio: 1.5 (calc) (ref 1.0–2.5)
ALT: 20 U/L (ref 6–29)
AST: 19 U/L (ref 10–35)
Albumin: 4.1 g/dL (ref 3.6–5.1)
Alkaline phosphatase (APISO): 86 U/L (ref 37–153)
BUN: 16 mg/dL (ref 7–25)
CO2: 30 mmol/L (ref 20–32)
Calcium: 9.6 mg/dL (ref 8.6–10.4)
Chloride: 102 mmol/L (ref 98–110)
Creat: 0.69 mg/dL (ref 0.50–1.05)
Globulin: 2.7 g/dL (calc) (ref 1.9–3.7)
Glucose, Bld: 91 mg/dL (ref 65–139)
Potassium: 3.3 mmol/L — ABNORMAL LOW (ref 3.5–5.3)
Sodium: 142 mmol/L (ref 135–146)
Total Bilirubin: 0.4 mg/dL (ref 0.2–1.2)
Total Protein: 6.8 g/dL (ref 6.1–8.1)

## 2021-09-13 MED ORDER — TERBINAFINE HCL 250 MG PO TABS: 250.0000 mg | ORAL_TABLET | Freq: Every day | ORAL | 0 refills | Status: DC

## 2021-09-13 NOTE — Progress Notes (Signed)
She presents today for her first follow-up visit she is just completed her first 30 days with no side effects.  She states that her ankles feel stiff.  Objective: Vital signs stable alert oriented x3.  She has a history of vasculitis and swelling in her legs.  They are hard and firm today with stiffness in the ankles on range of motion.  Most likely due to history of the vasculitis.  She also has some early changes of the nails and that the ripples seem to be resolving in the proximal nail fold.  She is excited about this.  Assessment: Long-term therapy with Lamisil completed her first 30 days.  Plan: We are going to request blood work and going to follow-up with her in 4 months were going to go ahead and give her another 90 days worth of Lamisil.  She will call with questions or concerns.

## 2021-09-16 ENCOUNTER — Encounter: Payer: Self-pay | Admitting: Neurology

## 2021-09-25 ENCOUNTER — Encounter: Payer: Self-pay | Admitting: Family Medicine

## 2021-09-26 NOTE — Telephone Encounter (Signed)
I can write such a letter for her. Set up an OV so we can discuss it  ?

## 2021-10-03 ENCOUNTER — Encounter: Payer: Self-pay | Admitting: Family Medicine

## 2021-10-03 ENCOUNTER — Ambulatory Visit: Payer: No Typology Code available for payment source | Admitting: Family Medicine

## 2021-10-03 VITALS — BP 140/80 | HR 85 | Temp 98.2°F | Ht 68.0 in | Wt 279.0 lb

## 2021-10-03 DIAGNOSIS — F419 Anxiety disorder, unspecified: Secondary | ICD-10-CM

## 2021-10-03 NOTE — Progress Notes (Signed)
? ?  Subjective:  ? ? Patient ID: Gabriela Shepherd, female    DOB: Apr 03, 1956, 66 y.o.   MRN: [id number] ? ?HPI ?Here asking for help with keeping her pets. She has 3 cats, and they have been with her for many years. She depends on these animals for companionship and for emotional health. She recently was forced to move out of her house and into an apartment. She is allowed to have 2 pets but not three.  ? ? ?Review of Systems  ?Constitutional: Negative.   ?Respiratory: Negative.    ?Cardiovascular: Negative.   ?Psychiatric/Behavioral:  Negative for dysphoric mood. The patient is nervous/anxious.   ? ?   ?Objective:  ? Physical Exam ?Constitutional:   ?   Appearance: Normal appearance.  ?Cardiovascular:  ?   Rate and Rhythm: Normal rate and regular rhythm.  ?   Pulses: Normal pulses.  ?   Heart sounds: Normal heart sounds.  ?Pulmonary:  ?   Effort: Pulmonary effort is normal.  ?   Breath sounds: Normal breath sounds.  ?Neurological:  ?   Mental Status: She is alert.  ?Psychiatric:     ?   Mood and Affect: Mood normal.     ?   Behavior: Behavior normal.     ?   Thought Content: Thought content normal.  ? ? ? ? ? ?   ?Assessment & Plan:  ?Anxiety, I do believe she should be able to keep all 3 of her cats. We wrote a letter for her to take to her landlord, and hopefully they will accommodate her needs.  ?Alysia Penna, MD ? ? ?

## 2021-10-22 ENCOUNTER — Other Ambulatory Visit: Payer: Self-pay | Admitting: Family Medicine

## 2021-10-22 DIAGNOSIS — M3131 Wegener's granulomatosis with renal involvement: Secondary | ICD-10-CM

## 2021-11-27 ENCOUNTER — Other Ambulatory Visit: Payer: Self-pay | Admitting: Family Medicine

## 2021-12-02 ENCOUNTER — Other Ambulatory Visit: Payer: Self-pay | Admitting: Family Medicine

## 2021-12-08 ENCOUNTER — Other Ambulatory Visit: Payer: Self-pay | Admitting: Podiatry

## 2021-12-11 IMAGING — MG MM DIGITAL SCREENING BILAT W/ TOMO AND CAD
8 series · 8 of 24 positions shown · non-contrast
Comparison: Previous exam(s).

CLINICAL DATA: Screening.

EXAM:
DIGITAL SCREENING BILATERAL MAMMOGRAM WITH TOMOSYNTHESIS AND CAD
TECHNIQUE: Bilateral screening digital craniocaudal and mediolateral oblique
mammograms were obtained. Bilateral screening digital breast
tomosynthesis was performed. The images were evaluated with
computer-aided detection.

[L MLO synth-2D]
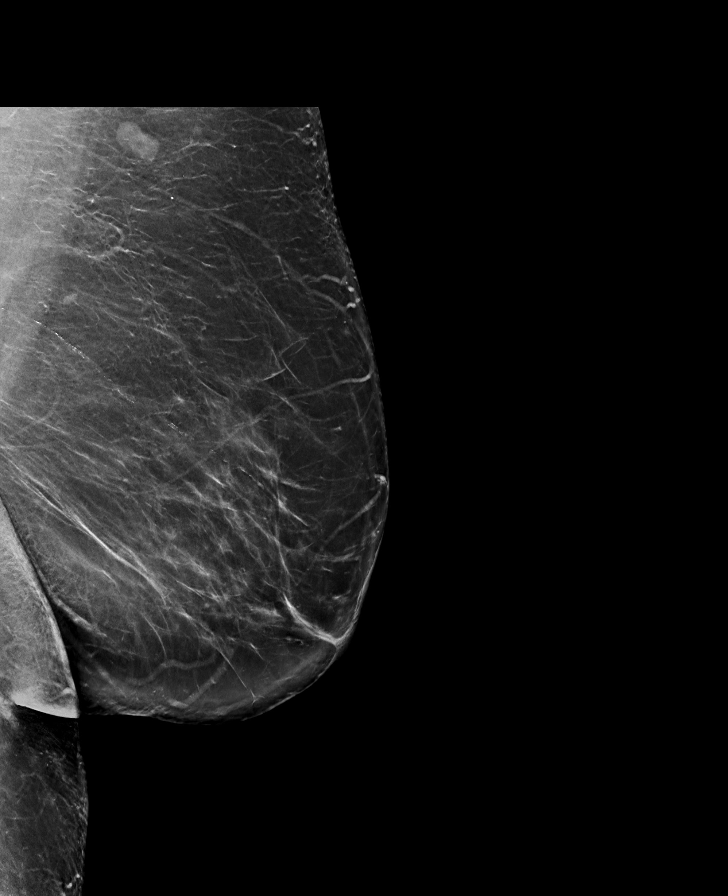

[R CC synth-2D]
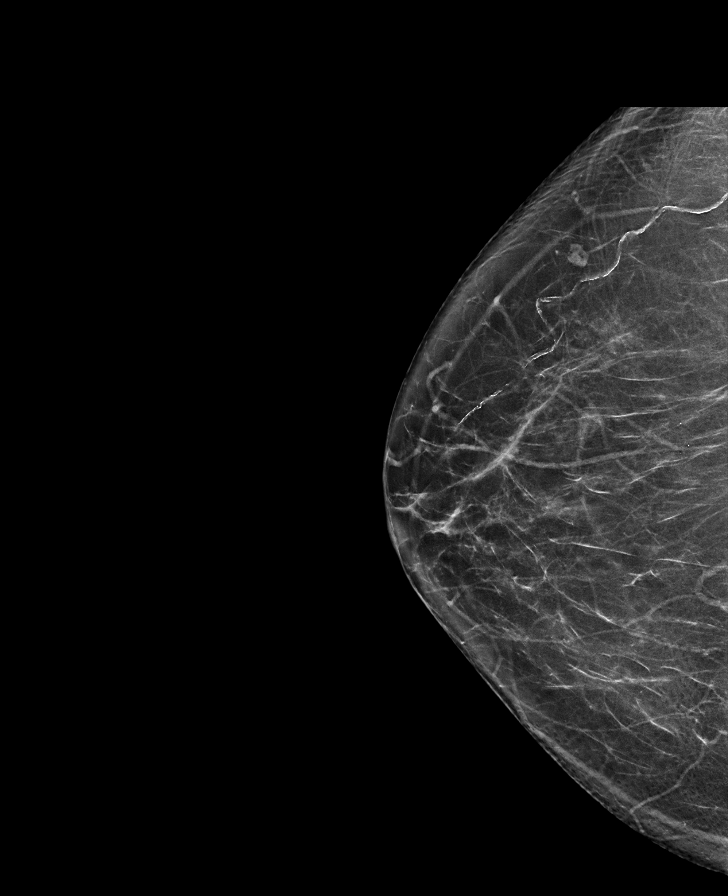

[R MLO synth-2D]
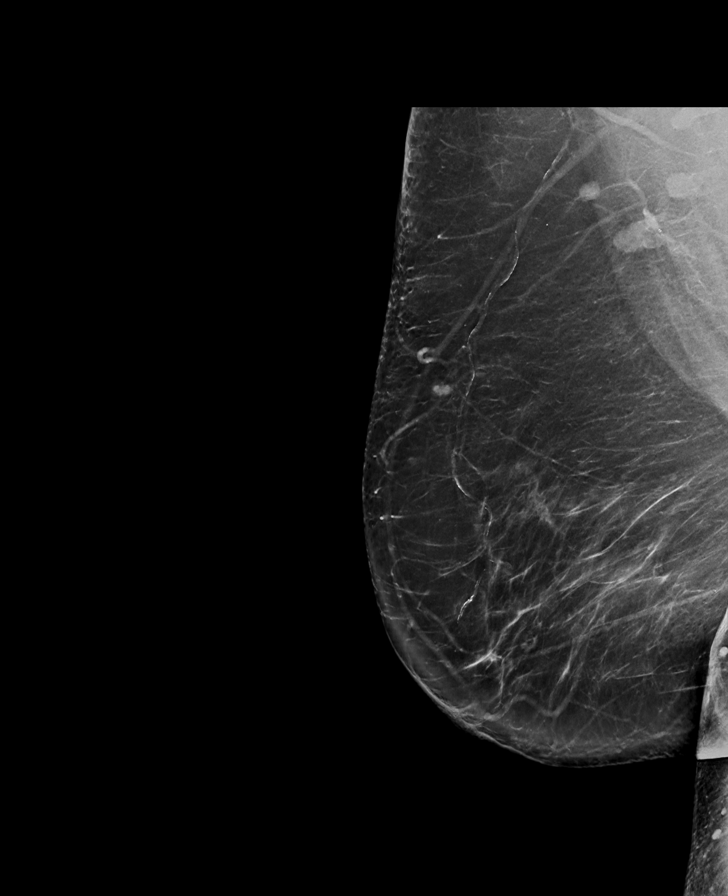

[L CC synth-2D]
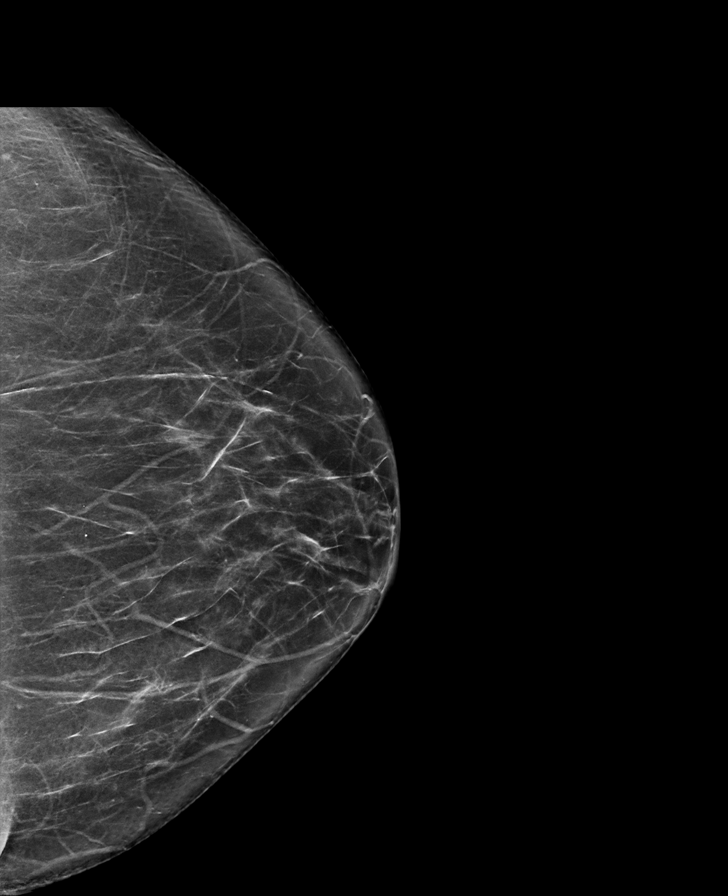

[R CC tomo · tomo slice 37/72.0]
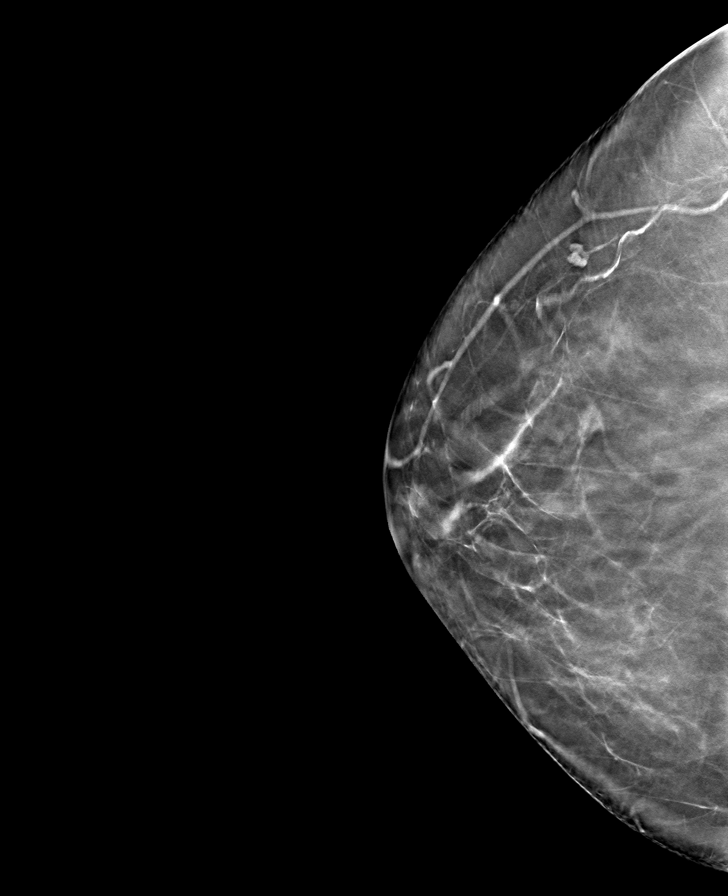

[L MLO tomo · tomo slice 47/93.0]
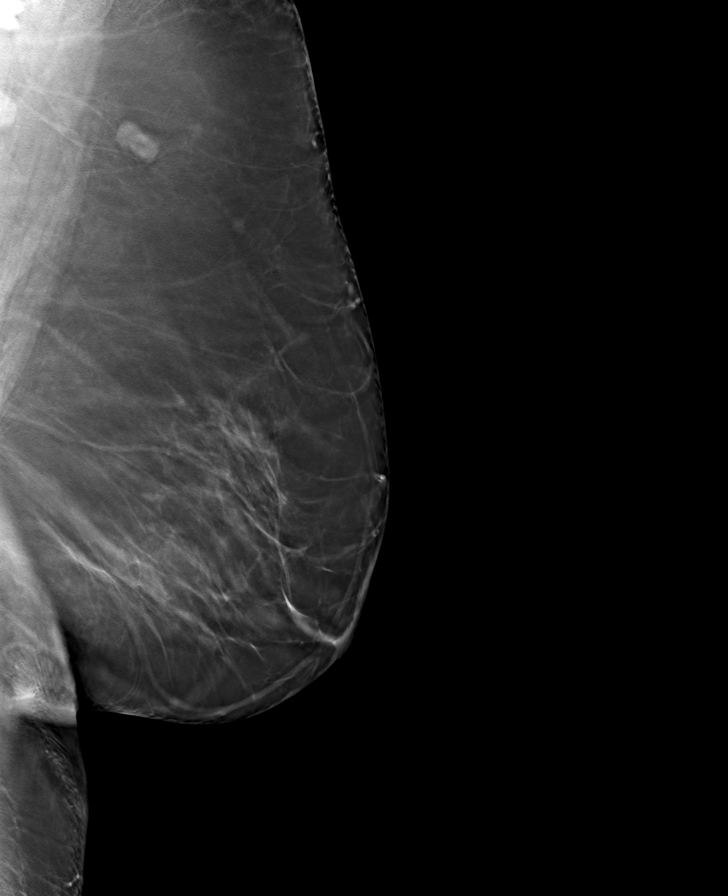

[R MLO tomo · tomo slice 47/93.0]
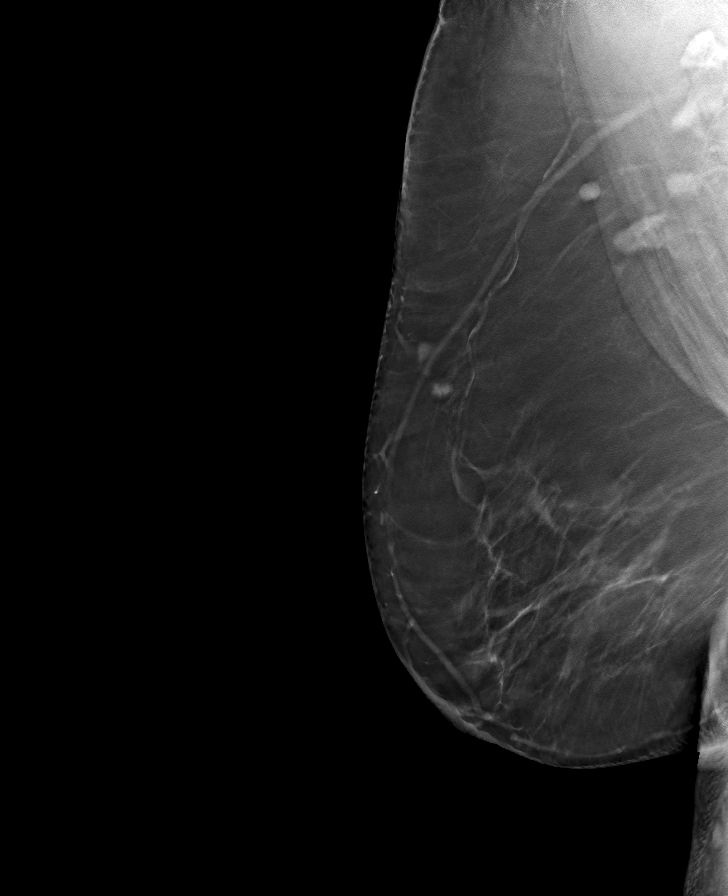

[L CC tomo · tomo slice 39/76.0]
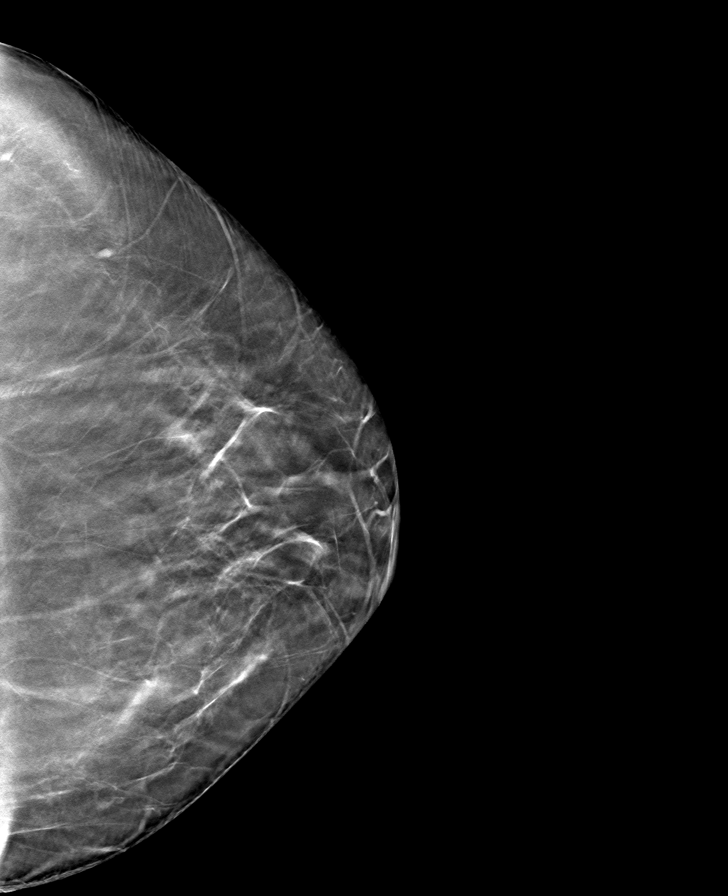

[8 of 24 positions shown; findings below may reference images not displayed]

ACR Breast Density Category b: There are scattered areas of
fibroglandular density.
FINDINGS: There are no findings suspicious for malignancy.
IMPRESSION: No mammographic evidence of malignancy. A result letter of this
screening mammogram will be mailed directly to the patient.

RECOMMENDATION:
Screening mammogram in one year. (Code:51-O-LD2)

BI-RADS CATEGORY  1: Negative.

## 2022-01-07 ENCOUNTER — Other Ambulatory Visit: Payer: Self-pay | Admitting: Family Medicine

## 2022-01-11 ENCOUNTER — Encounter: Payer: Self-pay | Admitting: Family Medicine

## 2022-01-11 ENCOUNTER — Other Ambulatory Visit: Payer: Self-pay

## 2022-01-11 MED ORDER — LEVOTHYROXINE SODIUM 50 MCG PO TABS: 50.0000 ug | ORAL_TABLET | Freq: Every day | ORAL | 0 refills | Status: DC

## 2022-01-17 ENCOUNTER — Encounter: Payer: Self-pay | Admitting: Podiatry

## 2022-01-17 ENCOUNTER — Ambulatory Visit (INDEPENDENT_AMBULATORY_CARE_PROVIDER_SITE_OTHER): Payer: No Typology Code available for payment source | Admitting: Podiatry

## 2022-01-17 DIAGNOSIS — L603 Nail dystrophy: Secondary | ICD-10-CM

## 2022-01-17 MED ORDER — TERBINAFINE HCL 250 MG PO TABS: 250.0000 mg | ORAL_TABLET | Freq: Every day | ORAL | 0 refills | Status: DC

## 2022-01-17 NOTE — Progress Notes (Signed)
She presents today for follow-up of her nail fungus she has completed 120 days of Lamisil.  States that I think they are doing great.  She denies fever chills nausea vomiting muscle aches pains calf pain back pain chest pain shortness of breath diarrhea itching or rashes.  Objective: Toenails appear to be growing out at the very least 50% some appear to be 85%.  No rashes.  Assessment long-term therapy with Lamisil improving nail pathology.  Plan: At this point we are going to continued Lamisil therapy.  We are going to start 1 tablet every other day for the next 60 days 30 tablets were dispensed.  Follow-up with her in 3 months.

## 2022-01-23 ENCOUNTER — Encounter: Payer: Self-pay | Admitting: Family Medicine

## 2022-01-23 ENCOUNTER — Ambulatory Visit: Payer: No Typology Code available for payment source | Admitting: Family Medicine

## 2022-01-23 VITALS — BP 128/80 | HR 71 | Temp 97.7°F | Wt 273.0 lb

## 2022-01-23 DIAGNOSIS — E039 Hypothyroidism, unspecified: Secondary | ICD-10-CM | POA: Diagnosis not present

## 2022-01-23 DIAGNOSIS — I10 Essential (primary) hypertension: Secondary | ICD-10-CM | POA: Diagnosis not present

## 2022-01-23 DIAGNOSIS — L719 Rosacea, unspecified: Secondary | ICD-10-CM | POA: Insufficient documentation

## 2022-01-23 LAB — T3, FREE: T3, Free: 2.6 pg/mL (ref 2.3–4.2)

## 2022-01-23 LAB — TSH: TSH: 2.24 u[IU]/mL (ref 0.35–5.50)

## 2022-01-23 LAB — T4, FREE: Free T4: 1.01 ng/dL (ref 0.60–1.60)

## 2022-01-23 MED ORDER — LEVOTHYROXINE SODIUM 50 MCG PO TABS: 50.0000 ug | ORAL_TABLET | Freq: Every day | ORAL | 3 refills | Status: DC

## 2022-01-23 MED ORDER — METRONIDAZOLE 0.75 % EX GEL: 1.0000 | Freq: Two times a day (BID) | CUTANEOUS | 5 refills | Status: DC

## 2022-01-23 NOTE — Progress Notes (Signed)
   Subjective:    Patient ID: Gabriela Shepherd, female    DOB: August 07, 1955, 66 y.o.   MRN: [id number]  HPI Here for 2 issues. First she asks for refills of Levothyroxine. Of note she has lost 14 lbs over the past year. She continues to see Nephrology, and they have adjusted her BP medications. Also she asks to check a red rash on her cheeks that comes and goes. This started about 6 months ago. There are no other symptoms with it.    Review of Systems  Constitutional: Negative.   Respiratory: Negative.    Cardiovascular: Negative.   Endocrine: Negative.   Skin:  Positive for rash.       Objective:   Physical Exam Constitutional:      Appearance: She is obese.  Cardiovascular:     Rate and Rhythm: Normal rate and regular rhythm.     Pulses: Normal pulses.     Heart sounds: Normal heart sounds.  Pulmonary:     Effort: Pulmonary effort is normal.     Breath sounds: Normal breath sounds.  Skin:    Comments: There is a faint red macular rash on both cheeks   Neurological:     Mental Status: She is alert.           Assessment & Plan:  For the hypothyroidism, we will check a thyroid lab panel today. Her HTN is stable. She has rosacea and we will treat this with Metronidazole gel BID. Alysia Penna, MD

## 2022-02-03 DIAGNOSIS — M7542 Impingement syndrome of left shoulder: Secondary | ICD-10-CM | POA: Insufficient documentation

## 2022-02-07 ENCOUNTER — Encounter: Payer: Self-pay | Admitting: Family Medicine

## 2022-02-08 NOTE — Telephone Encounter (Signed)
Make an OV for her

## 2022-02-09 NOTE — Telephone Encounter (Signed)
Yes she can use the gel  around her mouth also

## 2022-02-13 ENCOUNTER — Encounter: Payer: Self-pay | Admitting: Family Medicine

## 2022-02-13 ENCOUNTER — Ambulatory Visit (INDEPENDENT_AMBULATORY_CARE_PROVIDER_SITE_OTHER): Payer: No Typology Code available for payment source | Admitting: Family Medicine

## 2022-02-13 VITALS — BP 110/78 | HR 70 | Temp 98.4°F | Wt 272.0 lb

## 2022-02-13 DIAGNOSIS — G5702 Lesion of sciatic nerve, left lower limb: Secondary | ICD-10-CM | POA: Diagnosis not present

## 2022-02-13 NOTE — Progress Notes (Signed)
   Subjective:    Patient ID: Gabriela Shepherd, female    DOB: 1955/10/26, 66 y.o.   MRN: [id number]  HPI Here for a sharp pain in the posterior left thigh that started about 3 months ago. No known trauma. There is no back pain. It hurts when she sits on a hard chair or when she goes up or down steps. Tylenol and heat help briefly. She is seeing Dr. Theda Sers at [facility] for left shoulder pain and she is starting PT for that.    Review of Systems  Constitutional: Negative.   Respiratory: Negative.    Cardiovascular: Negative.   Musculoskeletal:  Positive for arthralgias and myalgias.       Objective:   Physical Exam Constitutional:      Appearance: She is obese.     Comments: Walks with a cane   Cardiovascular:     Rate and Rhythm: Normal rate and regular rhythm.     Pulses: Normal pulses.     Heart sounds: Normal heart sounds.  Pulmonary:     Effort: Pulmonary effort is normal.     Breath sounds: Normal breath sounds.  Musculoskeletal:     Comments: She is tender just at the point where the left posterior thigh meets the buttock. No back or sciatic tenderness.   Neurological:     Mental Status: She is alert.           Assessment & Plan:  Possible piriformis syndrome. We will refer her to [facility] for PT on this area as well.  Alysia Penna, MD

## 2022-02-22 DIAGNOSIS — M25552 Pain in left hip: Secondary | ICD-10-CM | POA: Insufficient documentation

## 2022-03-04 ENCOUNTER — Ambulatory Visit (HOSPITAL_BASED_OUTPATIENT_CLINIC_OR_DEPARTMENT_OTHER)
Admission: RE | Admit: 2022-03-04 | Discharge: 2022-03-04 | Disposition: A | Discharge status: Home / Self Care | Payer: No Typology Code available for payment source | Source: Ambulatory Visit | Attending: Diagnostic Radiology | Admitting: Diagnostic Radiology

## 2022-03-04 DIAGNOSIS — Z1231 Encounter for screening mammogram for malignant neoplasm of breast: Secondary | ICD-10-CM | POA: Insufficient documentation

## 2022-03-06 ENCOUNTER — Encounter: Payer: Self-pay | Admitting: Neurology

## 2022-03-06 ENCOUNTER — Ambulatory Visit: Payer: No Typology Code available for payment source | Admitting: Neurology

## 2022-03-06 VITALS — BP 142/80 | HR 79 | Ht 68.0 in | Wt 272.0 lb

## 2022-03-06 DIAGNOSIS — I7782 Antineutrophilic cytoplasmic antibody (ANCA) vasculitis: Secondary | ICD-10-CM | POA: Diagnosis not present

## 2022-03-06 DIAGNOSIS — G63 Polyneuropathy in diseases classified elsewhere: Secondary | ICD-10-CM | POA: Diagnosis not present

## 2022-03-06 MED ORDER — GABAPENTIN 300 MG PO CAPS: ORAL_CAPSULE | ORAL | 3 refills | Status: DC

## 2022-03-06 NOTE — Progress Notes (Signed)
[facility] Clinic Note - Initial Visit   Date: 03/06/22  Gabriela Shepherd MRN: [id number] DOB: 1955/10/03   Dear Dr. Milinda Pointer:  Thank you for your kind referral of Gabriela Shepherd for consultation of bilateral feet paresthesias. Although her history is well known to you, please allow [place] to reiterate it for the purpose of our medical record. The patient was accompanied to the clinic by self.   History of Present Illness: Gabriela Shepherd is a 66 y.o. left-handed female with ANCA-associated vascultitis (2020), hypothyroidism, hyperlipidemia,  presenting for evaluation of bilateral feet pain.   For the past 5 years, she has shooting pain in the feet and toes with associated numbness.  She used to get it in the lower legs, but this has improved on gabapentin '300mg'$  in the morning and '900mg'$  at bedtime.  Unfortunately, the shooting pain in the toes is unchanged. Symptoms are constant.  Pain is worse when anything touches her feet, such as bed sheets.  She was diagnosed with ANCA-vasculitis and takes rituximab infusions. She recalls that her feet pain was worse before she was treated for vasculitis.   She works at [employer].  She lives alone in a apartment.   Out-side paper records, electronic medical record, and images have been reviewed where available and summarized as:  Lab Results  Component Value Date   HGBA1C 5.8 09/23/2020   Lab Results  Component Value Date   VITAMINB12 451 09/23/2020   Lab Results  Component Value Date   TSH 2.24 01/23/2022   Lab Results  Component Value Date   ESRSEDRATE 82 Repeated and verified X2. (H) 12/31/2019    Past Medical History:  Diagnosis Date   Allergic rhinitis    sees Dr. Annamaria Boots   ANCA-associated vasculitis [facility])    Rheumatologist:  Dr. Arcelia Jew, [facility]   Anxiety    Asthma    sees Dr. Annamaria Boots   Basal cell carcinoma 2020   abdomen - right    Carpal tunnel syndrome    sees Dr. Amedeo Plenty   GERD  (gastroesophageal reflux disease)    Headache(784.0)    hx of pt states BP meds has improved    Hypertension    Osteoarthritis    Thyroid disease     Past Surgical History:  Procedure Laterality Date   BASAL CELL CARCINOMA EXCISION  2020   abdomen - right    BUNIONECTOMY     CARPAL TUNNEL RELEASE     CERVICAL POLYPECTOMY N/A 04/05/2015   Procedure: CERVICAL POLYPECTOMY removal ;  Surgeon: Megan Salon, MD;  Location: [facility];  Service: Gynecology;  Laterality: N/A;   COLONOSCOPY  01/04/2018   per Dr. Silverio Decamp, adenmatous polyps, repeat in 5 yrs    [place] N/A 04/05/2015   Procedure: DILATATION & CURETTAGE/HYSTEROSCOPY ;  Surgeon: Megan Salon, MD;  Location: [facility];  Service: Gynecology;  Laterality: N/A;   DILATION AND CURETTAGE OF UTERUS     hysteroscopic polyp resection     KNEE ARTHROSCOPY     POLYPECTOMY     REFRACTIVE SURGERY     related to hole in back of left eye as stated per pt    RENAL BIOPSY     ROBOTIC ASSISTED TOTAL HYSTERECTOMY WITH BILATERAL SALPINGO OOPHERECTOMY Bilateral 05/13/2015   Procedure: XI ROBOTIC ASSISTED TOTAL HYSTERECTOMY WITH BILATERAL SALPINGO OOPHORECTOMY;  Surgeon: Janie Morning, MD;  Location: [facility];  Service: Gynecology;  Laterality: Bilateral;   TOTAL KNEE ARTHROPLASTY Left  05/04/2014   Procedure: LEFT TOTAL KNEE ARTHROPLASTY;  Surgeon: Mauri Pole, MD;  Location: [facility];  Service: Orthopedics;  Laterality: Left;   TUBAL LIGATION       Medications:  Outpatient Encounter Medications as of 03/06/2022  Medication Sig   furosemide (LASIX) 20 MG tablet TAKE 1 TABLET BY MOUTH EVERY DAY   gabapentin (NEURONTIN) 300 MG capsule Take '300mg'$  AM, '300mg'$  PM and '600mg'$  QHS   levothyroxine (SYNTHROID) 50 MCG tablet Take 1 tablet (50 mcg total) by mouth daily before breakfast.   losartan-hydrochlorothiazide (HYZAAR) 50-12.5 MG tablet TAKE 1 TABLET BY MOUTH EVERY DAY   metroNIDAZOLE (METROGEL) 0.75 % gel Apply  1 Application topically 2 (two) times daily.   Multiple Vitamin (MULTIVITAMIN) tablet Take 1 tablet by mouth every morning.    pantoprazole (PROTONIX) 40 MG tablet TAKE 1 TABLET EVERY MORNING   terbinafine (LAMISIL) 250 MG tablet Take 1 tablet (250 mg total) by mouth daily. (Patient taking differently: Take 250 mg by mouth daily. Take one tablet every other night)   verapamil (CALAN) 120 MG tablet TAKE 1 TABLET DAILY   Facility-Administered Encounter Medications as of 03/06/2022  Medication   0.9 %  sodium chloride infusion    Allergies:  Allergies  Allergen Reactions   Amoxicillin Itching and Rash    Has patient had a PCN reaction causing immediate rash, facial/tongue/throat swelling, SOB or lightheadedness with hypotension: no Has patient had a PCN reaction causing severe rash involving mucus membranes or skin necrosis: no Has patient had a PCN reaction that required hospitalization no Has patient had a PCN reaction occurring within the last 10 years: no If all of the above answers are "NO", then may proceed with Cephalosporin use.     Penicillins Rash    Has patient had a PCN reaction causing immediate rash, facial/tongue/throat swelling, SOB or lightheadedness with hypotension: no Has patient had a PCN reaction causing severe rash involving mucus membranes or skin necrosis: no Has patient had a PCN reaction that required hospitalization no Has patient had a PCN reaction occurring within the last 10 years: no If all of the above answers are "NO", then may proceed with Cephalosporin use.     Family History: Family History  Problem Relation Age of Onset   Hypertension Mother    Liver cancer Mother    Heart disease Father    Hypertension Father    Diabetes Maternal Grandmother    Cancer Maternal Aunt    Breast cancer Neg Hx    Colon cancer Neg Hx    Esophageal cancer Neg Hx    Stomach cancer Neg Hx     Social History: Social History   Tobacco Use   Smoking status:  Never   Smokeless tobacco: Never  Vaping Use   Vaping Use: Never used  Substance Use Topics   Alcohol use: Not Currently   Drug use: No   Social History   Social History Narrative   Left Handed    Lives in a one story apartment    Vital Signs:  BP (!) 142/80   Pulse 79   Ht '5\' 8"'$  (1.727 m)   Wt 272 lb (123.4 kg)   LMP 11/21/2009   SpO2 98%   BMI 41.36 kg/m   Neurological Exam: MENTAL STATUS including orientation to time, place, person, recent and remote memory, attention span and concentration, language, and fund of knowledge is normal.  Speech is not dysarthric.  CRANIAL NERVES: II:  No visual field defects.  III-IV-VI: Pupils equal round and reactive to light.  Normal conjugate, extra-ocular eye movements in all directions of gaze.  No nystagmus.  No ptosis.   V:  Normal facial sensation.    VII:  Normal facial symmetry and movements.   VIII:  Normal hearing and vestibular function.   IX-X:  Normal palatal movement.   XI:  Normal shoulder shrug and head rotation.   XII:  Normal tongue strength and range of motion, no deviation or fasciculation.  MOTOR:  No atrophy, fasciculations or abnormal movements.  No pronator drift.   Upper Extremity:  Right  Left  Deltoid  5/5   5/5   Biceps  5/5   5/5   Triceps  5/5   5/5   Wrist extensors  5/5   5/5   Wrist flexors  5/5   5/5   Finger extensors  5/5   5/5   Finger flexors  5/5   5/5   Dorsal interossei  5/5   5/5   Abductor pollicis  5/5   5/5   Tone (Ashworth scale)  0  0   Lower Extremity:  Right  Left  Hip flexors  5/5   5/5   Knee flexors  5/5   5/5   Knee extensors  5/5   5/5   Dorsiflexors  5/5   5/5   Plantarflexors  5/5   5/5   Toe extensors  5/5   5/5   Toe flexors  4/5   4/5   Tone (Ashworth scale)  0  0   MSRs:  Right        Left                  brachioradialis 2+  2+  biceps 2+  2+  triceps 2+  2+  patellar 2+  2+  ankle jerk 2+  2+  Hoffman no  no  plantar response down  down   SENSORY:   Vibration, pin prick, and temperature reduced below the ankles.    COORDINATION/GAIT: Normal finger-to- nose-finger.  Intact rapid alternating movements bilaterally.   Gait mildly wide-based, assisted with cane, stable.     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  Exam is consistent with neuropathy.  No history of diabetes, alcohol, or family history of PN.   - NCS/EMG deferred, will reconsider if symptoms change in an atypical way  - For symptom management, increase gabapentin '600mg'$  in the AM and '900mg'$  QHS.    - Patient educated on daily foot inspection, fall prevention, and safety precautions around the home.  - Continue PT - She will send Mychart message in 1 month. Titrate as needed  Return to clinic in 4 months.   Thank you for allowing me to participate in patient's care.  If I can answer any additional questions, I would be pleased to do so.    Sincerely,    Jaycub Noorani K. Posey Pronto, DO

## 2022-03-06 NOTE — Patient Instructions (Addendum)
Increase gabapentin to '600mg'$  in the morning and '900mg'$  at bedtime.   Send MyChart message in 1 month  Return to clinic in 4 months

## 2022-03-08 ENCOUNTER — Other Ambulatory Visit: Payer: Self-pay | Admitting: Family Medicine

## 2022-04-08 ENCOUNTER — Encounter: Payer: Self-pay | Admitting: Neurology

## 2022-04-18 ENCOUNTER — Encounter: Payer: Self-pay | Admitting: Podiatry

## 2022-04-18 ENCOUNTER — Ambulatory Visit (INDEPENDENT_AMBULATORY_CARE_PROVIDER_SITE_OTHER): Payer: No Typology Code available for payment source | Admitting: Podiatry

## 2022-04-18 DIAGNOSIS — L603 Nail dystrophy: Secondary | ICD-10-CM

## 2022-04-18 DIAGNOSIS — M2042 Other hammer toe(s) (acquired), left foot: Secondary | ICD-10-CM | POA: Diagnosis not present

## 2022-04-18 MED ORDER — TERBINAFINE HCL 250 MG PO TABS: 250.0000 mg | ORAL_TABLET | Freq: Every day | ORAL | 0 refills | Status: DC

## 2022-04-18 NOTE — Progress Notes (Signed)
She presents today for follow-up of her nail fungus she states that is almost completely grown out she is very happy with the outcome thus far.  She has completed her first every other day dose of Lamisil.  She states that is looking so good.  She is also complaining of painful hammertoe deformities today left foot.  She states that they are starting to affect her ability to walk and it is exquisitely painful she can hardly wear shoes that she refers to her second toe.  She states that she sees her rheumatologist who has her on an every 14-monthinfusion of the medication that is currently not listed in her medications for vasculitis.  Objective: Vital signs are stable she is alert and oriented x3.  Pulses are palpable.  Problem #1: Onychomycosis is a growing out she has about 95% clearance at this point to all of her nail plates.  She is looking very good.  Problem #2 hammertoe deformities 2 through 5 on the left foot #2 being exquisitely painful at the PIPJ contracted dorsally at the level of the metatarsophalangeal joint #2 #3 #4 and #5.  She has plantar flexor contractions which is rigid at the second PIPJ but flexible at the third fourth and fifth PIPJ's lesser toes.  Assessment: Long-term therapy for onychomycosis with Lamisil.  Toes are looking much better.  Hammertoe deformities 2 through 5 left foot.  Second rigid.  Plan: Discussed etiology pathology conservative versus surgical therapies.  At this point we will start her on another every other day dose of Lamisil No. 30.  She will take 1 tablet every other day.  We also consented her today for hammertoe repair with K wires toes #2 #3 #4 #5 of the left foot.  I have reviewed her past medical history medications allergies surgeries and social history.  We discussed the possible postop complications associated with this.  She will be talking to her rheumatologist about her infusion.  She will make sure that it is not an immunosuppressive  drug.  She will also determine as to whether or not we should perform the surgery prior to her next infusion or possibly holding the infusion.  I will follow-up with her in the near future for surgery as well as 3 months for her nail and nail medication.

## 2022-04-27 ENCOUNTER — Encounter: Payer: Self-pay | Admitting: Podiatry

## 2022-04-28 ENCOUNTER — Encounter: Payer: Self-pay | Admitting: Podiatry

## 2022-05-26 ENCOUNTER — Other Ambulatory Visit: Payer: Self-pay | Admitting: Family Medicine

## 2022-05-29 ENCOUNTER — Encounter (HOSPITAL_BASED_OUTPATIENT_CLINIC_OR_DEPARTMENT_OTHER): Payer: Self-pay | Admitting: Obstetrics & Gynecology

## 2022-05-29 ENCOUNTER — Ambulatory Visit (INDEPENDENT_AMBULATORY_CARE_PROVIDER_SITE_OTHER): Payer: No Typology Code available for payment source | Admitting: Obstetrics & Gynecology

## 2022-05-29 VITALS — BP 146/71 | HR 66 | Ht 69.0 in | Wt 278.6 lb

## 2022-05-29 DIAGNOSIS — Z90710 Acquired absence of both cervix and uterus: Secondary | ICD-10-CM | POA: Diagnosis not present

## 2022-05-29 DIAGNOSIS — E039 Hypothyroidism, unspecified: Secondary | ICD-10-CM

## 2022-05-29 DIAGNOSIS — Z13820 Encounter for screening for osteoporosis: Secondary | ICD-10-CM

## 2022-05-29 DIAGNOSIS — I10 Essential (primary) hypertension: Secondary | ICD-10-CM

## 2022-05-29 DIAGNOSIS — Z01419 Encounter for gynecological examination (general) (routine) without abnormal findings: Secondary | ICD-10-CM

## 2022-05-29 NOTE — Progress Notes (Unsigned)
66 y.o. G30P3003 Single White or Caucasian female here for annual exam.  Doing well.  Denies vaginal bleeding.  Having some hammer toe issues and is considering issues in her toe.  Also having some knee issues and has seen Dr. Alvan Dame.    Patient's last menstrual period was 11/21/2009.          Sexually active: No.  The current method of family planning is post menopausal status.    Smoker:  no  Health Maintenance: Pap:  12/10/2014 Negative History of abnormal Pap:  no MMG:  03/04/2022 Negative Colonoscopy:  01/04/2018, follow up 5 years BMD:  Ordered Screening Labs: 03/2022   reports that she has never smoked. She has never used smokeless tobacco. She reports that she does not currently use alcohol. She reports that she does not use drugs.  Past Medical History:  Diagnosis Date   Allergic rhinitis    sees Dr. Annamaria Boots   ANCA-associated vasculitis [facility])    Rheumatologist:  Dr. Arcelia Jew, [facility]   Anxiety    Asthma    sees Dr. Annamaria Boots   Basal cell carcinoma 2020   abdomen - right    Carpal tunnel syndrome    sees Dr. Amedeo Plenty   GERD (gastroesophageal reflux disease)    Headache(784.0)    hx of pt states BP meds has improved    Hypertension    Osteoarthritis    Thyroid disease     Past Surgical History:  Procedure Laterality Date   BASAL CELL CARCINOMA EXCISION  2020   abdomen - right    BUNIONECTOMY     CARPAL TUNNEL RELEASE     CERVICAL POLYPECTOMY N/A 04/05/2015   Procedure: CERVICAL POLYPECTOMY removal ;  Surgeon: Megan Salon, MD;  Location: [facility];  Service: Gynecology;  Laterality: N/A;   COLONOSCOPY  01/04/2018   per Dr. Silverio Decamp, adenmatous polyps, repeat in 5 yrs    [place] N/A 04/05/2015   Procedure: DILATATION & CURETTAGE/HYSTEROSCOPY ;  Surgeon: Megan Salon, MD;  Location: [facility];  Service: Gynecology;  Laterality: N/A;   DILATION AND CURETTAGE OF UTERUS     hysteroscopic polyp resection     KNEE ARTHROSCOPY     POLYPECTOMY      REFRACTIVE SURGERY     related to hole in back of left eye as stated per pt    RENAL BIOPSY     ROBOTIC ASSISTED TOTAL HYSTERECTOMY WITH BILATERAL SALPINGO OOPHERECTOMY Bilateral 05/13/2015   Procedure: XI ROBOTIC ASSISTED TOTAL HYSTERECTOMY WITH BILATERAL SALPINGO OOPHORECTOMY;  Surgeon: Janie Morning, MD;  Location: [facility];  Service: Gynecology;  Laterality: Bilateral;   TOTAL KNEE ARTHROPLASTY Left 05/04/2014   Procedure: LEFT TOTAL KNEE ARTHROPLASTY;  Surgeon: Mauri Pole, MD;  Location: [facility];  Service: Orthopedics;  Laterality: Left;   TUBAL LIGATION      Current Outpatient Medications  Medication Sig Dispense Refill   furosemide (LASIX) 20 MG tablet TAKE 1 TABLET BY MOUTH EVERY DAY 90 tablet 3   gabapentin (NEURONTIN) 300 MG capsule Take 2 tablet in the morning and 3 at bedtime. 450 capsule 3   levothyroxine (SYNTHROID) 50 MCG tablet Take 1 tablet (50 mcg total) by mouth daily before breakfast. 90 tablet 3   losartan-hydrochlorothiazide (HYZAAR) 50-12.5 MG tablet TAKE 1 TABLET BY MOUTH EVERY DAY 90 tablet 0   Multiple Vitamin (MULTIVITAMIN) tablet Take 1 tablet by mouth every morning.      pantoprazole (PROTONIX) 40 MG tablet TAKE 1 TABLET EVERY  MORNING 90 tablet 0   terbinafine (LAMISIL) 250 MG tablet Take 1 tablet (250 mg total) by mouth daily. 30 tablet 0   verapamil (CALAN) 120 MG tablet TAKE 1 TABLET DAILY 90 tablet 1   metroNIDAZOLE (METROGEL) 0.75 % gel Apply 1 Application topically 2 (two) times daily. (Patient not taking: Reported on 05/29/2022) 45 g 5   Current Facility-Administered Medications  Medication Dose Route Frequency Provider Last Rate Last Admin   0.9 %  sodium chloride infusion  500 mL Intravenous Continuous Nandigam, Venia Minks, MD        Family History  Problem Relation Age of Onset   Hypertension Mother    Liver cancer Mother    Heart disease Father    Hypertension Father    Diabetes Maternal Grandmother    Cancer Maternal Aunt    Breast cancer  Neg Hx    Colon cancer Neg Hx    Esophageal cancer Neg Hx    Stomach cancer Neg Hx     ROS: Constitutional: negative Genitourinary:negative  Exam:   BP (!) 146/71 (BP Location: Right Arm, Patient Position: Sitting, Cuff Size: Large)   Pulse 66   Ht '5\' 9"'$  (1.753 m) Comment: Reported  Wt 278 lb 9.6 oz (126.4 kg)   LMP 11/21/2009   BMI 41.14 kg/m   Height: '5\' 9"'$  (175.3 cm) (Reported)  General appearance: alert, cooperative and appears stated age Head: Normocephalic, without obvious abnormality, atraumatic Neck: no adenopathy, supple, symmetrical, trachea midline and thyroid normal to inspection and palpation Lungs: clear to auscultation bilaterally Breasts: normal appearance, no masses or tenderness Heart: regular rate and rhythm Abdomen: soft, non-tender; bowel sounds normal; no masses,  no organomegaly Extremities: extremities normal, atraumatic, no cyanosis or edema Skin: Skin color, texture, turgor normal. No rashes or lesions Lymph nodes: Cervical, supraclavicular, and axillary nodes normal. No abnormal inguinal nodes palpated Neurologic: Grossly normal   Pelvic: External genitalia:  no lesions              Urethra:  normal appearing urethra with no masses, tenderness or lesions              Bartholins and Skenes: normal                 Vagina: normal appearing vagina with normal color and no discharge, no lesions              Cervix: no lesions              Pap taken: No. Bimanual Exam:  Uterus:  uterus absent              Adnexa: no mass, fullness, tenderness               Rectovaginal: Confirms               Anus:  normal sphincter tone, no lesions  Chaperone, Octaviano Batty, CMA, was present for exam.  Assessment/Plan: 1. Well woman exam with routine gynecological exam - Pap smear not indicated due to hysterectomy - Mammogram 03/04/2022 - Colonoscopy 01/04/2018, follow up 5 years - Bone mineral density needs to be ordered - lab work done with Dr. Sarajane Jews - vaccines  reviewed/updated  2. Osteoporosis screening - DG BONE DENSITY (DXA); Future  3. H/O: hysterectomy  4. Acquired hypothyroidism - managed by Dr. Sarajane Jews  5. Essential hypertension - managed by Dr. Sarajane Jews

## 2022-06-22 ENCOUNTER — Encounter: Payer: Self-pay | Admitting: Podiatry

## 2022-07-11 ENCOUNTER — Encounter: Payer: Self-pay | Admitting: Podiatry

## 2022-07-11 ENCOUNTER — Ambulatory Visit (INDEPENDENT_AMBULATORY_CARE_PROVIDER_SITE_OTHER): Payer: No Typology Code available for payment source | Admitting: Podiatry

## 2022-07-11 DIAGNOSIS — L603 Nail dystrophy: Secondary | ICD-10-CM

## 2022-07-11 MED ORDER — TERBINAFINE HCL 250 MG PO TABS: 250.0000 mg | ORAL_TABLET | Freq: Every day | ORAL | 0 refills | Status: DC

## 2022-07-11 NOTE — Progress Notes (Signed)
Gabriela Shepherd presents today primarily for follow-up of her nail fungus plate.  She is completed her second every other day dosing schedule for her Lamisil.  She states that the toenails are looking better but they are not well yet.  She would also like to return her cam boot and her surgery information.  She states that she cannot have surgery done anytime soon until she is 7 months out from her infusion.  She states that that would be basically next year since we have already missed this appointment.  So she states that she would like to see if her knee is we will have to have surgery for start her toes so she she is bringing the materials back to [place].  Toenails have grown out nicely they have approximately 25% more to go but otherwise they are improving considerably.  Assessment: Well-healing long-term therapy onychomycosis with Lamisil.  Plan: She will start her third every other day dose and I will follow-up with her in 3 months.

## 2022-07-27 ENCOUNTER — Encounter: Payer: No Typology Code available for payment source | Admitting: Podiatry

## 2022-08-02 ENCOUNTER — Encounter: Payer: Self-pay | Admitting: Neurology

## 2022-08-02 ENCOUNTER — Ambulatory Visit: Payer: No Typology Code available for payment source | Admitting: Neurology

## 2022-08-02 VITALS — BP 146/84 | HR 87 | Ht 69.0 in | Wt 277.0 lb

## 2022-08-02 DIAGNOSIS — G63 Polyneuropathy in diseases classified elsewhere: Secondary | ICD-10-CM | POA: Diagnosis not present

## 2022-08-02 DIAGNOSIS — I7782 Antineutrophilic cytoplasmic antibody (ANCA) vasculitis: Secondary | ICD-10-CM

## 2022-08-02 NOTE — Patient Instructions (Signed)
Increase gabapentin '600mg'$  three times daily (morning, ~4pm, and bedtime)  Please send a Mychart update in 2 weeks  I will see you back in 6 months

## 2022-08-02 NOTE — Progress Notes (Signed)
Follow-up Visit   Date: 08/02/2022    Gabriela Shepherd MRN: [id number] DOB: Jan 17, 1956    Gabriela Shepherd is a 67 y.o. left-handed Caucasian female with ANCA-associated vasculitis (2020), hypothyroidism, hyperlipidemia returning to the clinic for follow-up of neuropathy.  The patient was accompanied to the clinic by self.   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  Exam is consistent with neuropathy.  No history of diabetes, alcohol, or family history of PN. NCS/EMG was deferred.  Her leg pain has improved on gabapentin, but she continues to have distal paresthesias  - Increase gabapentin to '600mg'$  three times daily  - If pain is not better at a higher dose, she can go back to taking gabapentin '600mg'$  in the AM and '900mg'$  QHS.                  - Patient educated on daily foot inspection, fall prevention, and safety precautions around the home.                 - Continue PT    - Patient will send a MyChart message with update in 2 weeks  Return to clinic in 6 months  --------------------------------------------- History of present illness: For the past 5 years, she has shooting pain in the feet and toes with associated numbness.  She used to get it in the lower legs, but this has improved on gabapentin '300mg'$  in the morning and '900mg'$  at bedtime.  Unfortunately, the shooting pain in the toes is unchanged. Symptoms are constant.  Pain is worse when anything touches her feet, such as bed sheets.  She was diagnosed with ANCA-vasculitis and takes rituximab infusions. She recalls that her feet pain was worse before she was treated for vasculitis.    She works at [employer].  She lives alone in a apartment.   UPDATE 08/02/2022:  She is here for follow-up visit.  She continues to have burning and shooting pain in the balls of the feet and toes, despite being on gabapentin '600mg'$  AM and '900mg'$  at bedtime.  She denies medication  side effects. Her leg pain has significant improved.  She endorses some imbalance, but also feels this may be contributed by her knee pain.    Medications:  Current Outpatient Medications on File Prior to Visit  Medication Sig Dispense Refill   furosemide (LASIX) 20 MG tablet TAKE 1 TABLET BY MOUTH EVERY DAY 90 tablet 3   gabapentin (NEURONTIN) 300 MG capsule Take 2 tablet in the morning and 3 at bedtime. 450 capsule 3   levothyroxine (SYNTHROID) 50 MCG tablet Take 1 tablet (50 mcg total) by mouth daily before breakfast. 90 tablet 3   losartan-hydrochlorothiazide (HYZAAR) 50-12.5 MG tablet TAKE 1 TABLET BY MOUTH EVERY DAY 90 tablet 0   Multiple Vitamin (MULTIVITAMIN) tablet Take 1 tablet by mouth every morning.      pantoprazole (PROTONIX) 40 MG tablet TAKE 1 TABLET EVERY MORNING 90 tablet 0   terbinafine (LAMISIL) 250 MG tablet Take 1 tablet (250 mg total) by mouth daily. 30 tablet 0   verapamil (CALAN) 120 MG tablet TAKE 1 TABLET DAILY 90 tablet 1   Current Facility-Administered Medications on File Prior to Visit  Medication Dose Route Frequency Provider Last Rate Last Admin   0.9 %  sodium chloride infusion  500 mL Intravenous Continuous Nandigam, Venia Minks, MD        Allergies:  Allergies  Allergen Reactions   Amoxicillin Itching and  Rash    Has patient had a PCN reaction causing immediate rash, facial/tongue/throat swelling, SOB or lightheadedness with hypotension: no Has patient had a PCN reaction causing severe rash involving mucus membranes or skin necrosis: no Has patient had a PCN reaction that required hospitalization no Has patient had a PCN reaction occurring within the last 10 years: no If all of the above answers are "NO", then may proceed with Cephalosporin use.     Penicillins Rash    Has patient had a PCN reaction causing immediate rash, facial/tongue/throat swelling, SOB or lightheadedness with hypotension: no Has patient had a PCN reaction causing severe rash  involving mucus membranes or skin necrosis: no Has patient had a PCN reaction that required hospitalization no Has patient had a PCN reaction occurring within the last 10 years: no If all of the above answers are "NO", then may proceed with Cephalosporin use.     Vital Signs:  BP (!) 146/84   Pulse 87   Ht '5\' 9"'$  (1.753 m)   Wt 277 lb (125.6 kg)   LMP 11/21/2009   SpO2 98%   BMI 40.91 kg/m   Neurological Exam: MENTAL STATUS including orientation to time, place, person, recent and remote memory, attention span and concentration, language, and fund of knowledge is normal.  Speech is not dysarthric.  CRANIAL NERVES:.  Pupils equal round and reactive to light.  Normal conjugate, extra-ocular eye movements in all directions of gaze.  No ptosis.  Face is symmetric.   MOTOR:  Motor strength is 5/5 in all extremities.  No atrophy, fasciculations or abnormal movements.  No pronator drift.  Tone is normal.    MSRs:  Reflexes are 2+/4 throughout.  SENSORY:  Vibration reduced at the feet bilaterally.  COORDINATION/GAIT:  Normal finger-to- nose-finger.  Intact rapid alternating movements bilaterally.  Gait is mildly wide-based, antalgic, unassisted.  She has a cane which she feels more stable with..   Data: n/a   Thank you for allowing me to participate in patient's care.  If I can answer any additional questions, I would be pleased to do so.    Sincerely,    Takayla Baillie K. Posey Pronto, DO

## 2022-08-03 ENCOUNTER — Encounter: Payer: No Typology Code available for payment source | Admitting: Podiatry

## 2022-08-09 ENCOUNTER — Encounter: Payer: Self-pay | Admitting: Neurology

## 2022-08-10 MED ORDER — GABAPENTIN 300 MG PO CAPS: 600.0000 mg | ORAL_CAPSULE | Freq: Three times a day (TID) | ORAL | 3 refills | Status: DC

## 2022-08-17 ENCOUNTER — Encounter: Payer: No Typology Code available for payment source | Admitting: Podiatry

## 2022-08-24 ENCOUNTER — Other Ambulatory Visit: Payer: Self-pay | Admitting: Family Medicine

## 2022-08-31 ENCOUNTER — Encounter: Payer: No Typology Code available for payment source | Admitting: Podiatry

## 2022-09-14 LAB — LAB REPORT - SCANNED
Albumin, Urine POC: <3
Creatinine, POC: 59.1 mg/dL
EGFR: 96
Microalb Creat Ratio: <5

## 2022-10-12 ENCOUNTER — Ambulatory Visit: Payer: No Typology Code available for payment source | Admitting: Podiatry

## 2022-10-12 ENCOUNTER — Encounter: Payer: Self-pay | Admitting: Podiatry

## 2022-10-12 DIAGNOSIS — L603 Nail dystrophy: Secondary | ICD-10-CM | POA: Diagnosis not present

## 2022-10-12 MED ORDER — TERBINAFINE HCL 250 MG PO TABS: 250.0000 mg | ORAL_TABLET | Freq: Every day | ORAL | 0 refills | Status: DC

## 2022-10-12 NOTE — Progress Notes (Signed)
She presents today for follow-up of her nail fungus.  She states that she is doing quite well she is very happy with the outcome.  Looking much better she has just finished her third every other day Lamisil cycle.  Objective: Signs are stable alert and x 3 with erythematous hallux.  There hallux nails, thickened nails on the left foot in particular to be growing out very nicely she has about 85%.  At this point in time.  Right foot appears to be 100%.  Assessment: Well-healing onychomycosis.  Plan: Follow-up with me in 3 months we are going go ahead and do another 30 tablets of Lamisil.  She will take 1 tablet every other day.

## 2022-10-14 ENCOUNTER — Other Ambulatory Visit: Payer: Self-pay | Admitting: Family Medicine

## 2022-10-14 DIAGNOSIS — M3131 Wegener's granulomatosis with renal involvement: Secondary | ICD-10-CM

## 2022-11-09 ENCOUNTER — Ambulatory Visit: Payer: No Typology Code available for payment source | Admitting: Podiatry

## 2022-11-19 ENCOUNTER — Other Ambulatory Visit: Payer: Self-pay | Admitting: Family Medicine

## 2023-01-13 ENCOUNTER — Other Ambulatory Visit: Payer: Self-pay | Admitting: Family Medicine

## 2023-01-13 DIAGNOSIS — M3131 Wegener's granulomatosis with renal involvement: Secondary | ICD-10-CM

## 2023-01-18 ENCOUNTER — Ambulatory Visit: Payer: No Typology Code available for payment source | Admitting: Podiatry

## 2023-01-21 ENCOUNTER — Other Ambulatory Visit: Payer: Self-pay | Admitting: Podiatry

## 2023-02-05 ENCOUNTER — Ambulatory Visit: Payer: No Typology Code available for payment source | Admitting: Neurology

## 2023-02-05 ENCOUNTER — Encounter: Payer: Self-pay | Admitting: Neurology

## 2023-02-05 VITALS — BP 145/82 | HR 92 | Ht 69.0 in | Wt 284.0 lb

## 2023-02-05 DIAGNOSIS — I7782 Antineutrophilic cytoplasmic antibody (ANCA) vasculitis: Secondary | ICD-10-CM | POA: Diagnosis not present

## 2023-02-05 DIAGNOSIS — G63 Polyneuropathy in diseases classified elsewhere: Secondary | ICD-10-CM

## 2023-02-05 NOTE — Progress Notes (Signed)
Follow-up Visit   Date: 02/05/2023    Gabriela Shepherd MRN: [id number] DOB: 1956-03-28    Gabriela Shepherd is a 67 y.o. left-handed Caucasian female with ANCA-associated vasculitis (2020), hypothyroidism, hyperlipidemia returning to the clinic for follow-up of neuropathy.  The patient was accompanied to the clinic by self.   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  No history of diabetes, alcohol, or family history of PN. NCS/EMG was deferred.  Pain is well-controlled on current regiment. She has occasionally shooting pain in the legs, which is transient and self-limiting.  - Continue gabapentin 600mg  three times daily  Return to clinic in 6 months  --------------------------------------------- History of present illness: For the past 5 years, she has shooting pain in the feet and toes with associated numbness.  She used to get it in the lower legs, but this has improved on gabapentin 300mg  in the morning and 900mg  at bedtime.  Unfortunately, the shooting pain in the toes is unchanged. Symptoms are constant.  Pain is worse when anything touches her feet, such as bed sheets.  She was diagnosed with ANCA-vasculitis and takes rituximab infusions. She recalls that her feet pain was worse before she was treated for vasculitis.    She works at [employer].  She lives alone in a apartment.   UPDATE 08/02/2022:  She is here for follow-up visit.  She continues to have burning and shooting pain in the balls of the feet and toes, despite being on gabapentin 600mg  AM and 900mg  at bedtime.  She denies medication side effects. Her leg pain has significant improved.  She endorses some imbalance, but also feels this may be contributed by her knee pain.    UPDATE 02/05/2023:  She is here for 6 month follow-up.  She noticed reduced pain with increasing gabapentin 600mg  three times daily.  Overall, pain is controlled about 75% but  recently, she has been having spells of sensation of something wet on her legs or cool. She still has burning pain which pain in the feet, which occurs about once per week which is lasts a few seconds.  She has not had any falls.   Medications:  Current Outpatient Medications on File Prior to Visit  Medication Sig Dispense Refill   furosemide (LASIX) 20 MG tablet TAKE 1 TABLET BY MOUTH EVERY DAY 90 tablet 0   gabapentin (NEURONTIN) 300 MG capsule Take 2 capsules (600 mg total) by mouth 3 (three) times daily. Take 2 tablet in the morning and 3 at bedtime. (Patient taking differently: Take 600 mg by mouth 3 (three) times daily. Take 2 tablets in the morning 2 tablets in the afternoon and 2 tablets at bedtime.) 540 capsule 3   levothyroxine (SYNTHROID) 50 MCG tablet Take 1 tablet (50 mcg total) by mouth daily before breakfast. 90 tablet 3   losartan-hydrochlorothiazide (HYZAAR) 100-25 MG tablet TAKE 1 TABLET BY MOUTH EVERY DAY 90 tablet 1   Multiple Vitamin (MULTIVITAMIN) tablet Take 1 tablet by mouth every morning.      pantoprazole (PROTONIX) 40 MG tablet TAKE 1 TABLET EVERY MORNING 90 tablet 0   verapamil (CALAN) 120 MG tablet TAKE 1 TABLET DAILY 90 tablet 1   Current Facility-Administered Medications on File Prior to Visit  Medication Dose Route Frequency Provider Last Rate Last Admin   0.9 %  sodium chloride infusion  500 mL Intravenous Continuous Nandigam, Eleonore Chiquito, MD        Allergies:  Allergies  Allergen Reactions  Amoxicillin Itching and Rash    Has patient had a PCN reaction causing immediate rash, facial/tongue/throat swelling, SOB or lightheadedness with hypotension: no Has patient had a PCN reaction causing severe rash involving mucus membranes or skin necrosis: no Has patient had a PCN reaction that required hospitalization no Has patient had a PCN reaction occurring within the last 10 years: no If all of the above answers are "NO", then may proceed with Cephalosporin use.      Penicillins Rash    Has patient had a PCN reaction causing immediate rash, facial/tongue/throat swelling, SOB or lightheadedness with hypotension: no Has patient had a PCN reaction causing severe rash involving mucus membranes or skin necrosis: no Has patient had a PCN reaction that required hospitalization no Has patient had a PCN reaction occurring within the last 10 years: no If all of the above answers are "NO", then may proceed with Cephalosporin use.     Vital Signs:  BP (!) 145/82   Pulse 92   Ht 5\' 9"  (1.753 m)   Wt 284 lb (128.8 kg)   LMP 11/21/2009   SpO2 94%   BMI 41.94 kg/m   Neurological Exam: MENTAL STATUS including orientation to time, place, person, recent and remote memory, attention span and concentration, language, and fund of knowledge is normal.  Speech is not dysarthric.  CRANIAL NERVES:.  Pupils equal round and reactive to light.  Normal conjugate, extra-ocular eye movements in all directions of gaze.  No ptosis.  Face is symmetric.   MOTOR:  Motor strength is 5/5 in all extremities.  No atrophy, fasciculations or abnormal movements.  No pronator drift.   MSRs:  Reflexes are 2+/4 throughout.  SENSORY:  Vibration reduced at the feet bilaterally.  COORDINATION/GAIT:  Normal finger-to- nose-finger.  Intact rapid alternating movements bilaterally.  Gait is mildly wide-based, antalgic due to R knee pain, unassisted.  She has a cane which she feels more stable with..   Data: n/a   Thank you for allowing me to participate in patient's care.  If I can answer any additional questions, I would be pleased to do so.    Sincerely,    Jamayah Myszka K. Allena Katz, DO

## 2023-02-05 NOTE — Patient Instructions (Signed)
I will see you back in 6 months 

## 2023-02-08 LAB — LAB REPORT - SCANNED
Creatinine, POC: 41.9 mg/dL
EGFR: 86

## 2023-02-15 ENCOUNTER — Ambulatory Visit: Payer: No Typology Code available for payment source | Admitting: Podiatry

## 2023-02-15 ENCOUNTER — Other Ambulatory Visit: Payer: Self-pay | Admitting: Family Medicine

## 2023-02-15 DIAGNOSIS — M24573 Contracture, unspecified ankle: Secondary | ICD-10-CM

## 2023-02-15 DIAGNOSIS — B351 Tinea unguium: Secondary | ICD-10-CM

## 2023-02-15 DIAGNOSIS — M79674 Pain in right toe(s): Secondary | ICD-10-CM | POA: Diagnosis not present

## 2023-02-15 DIAGNOSIS — M79675 Pain in left toe(s): Secondary | ICD-10-CM

## 2023-02-15 MED ORDER — TERBINAFINE HCL 250 MG PO TABS
250.0000 mg | ORAL_TABLET | Freq: Every day | ORAL | 0 refills | Status: DC
Start: 2023-02-15 — End: 2023-05-24

## 2023-02-15 NOTE — Progress Notes (Signed)
Chief Complaint  Patient presents with   Nail Problem    Nail fungus     HPI: 67 y.o. female presents today for follow-up of onychomycosis to the toenails.  She finished her last course of oral terbinafine approximately 3 months ago.  Denies any side effects.  She notes that she was in her development's public pool recently and this turned her toenails green.  States that she knows it is wonderful because someone else's swimsuit in someone's hair also turned green.  She is wondering if there are any exercises to improve her range of motion on her ankles.  She states that they feel stiff.  Past Medical History:  Diagnosis Date   Allergic rhinitis    sees Dr. Maple Hudson   ANCA-associated vasculitis [facility])    Rheumatologist:  Dr. Sinclair Grooms, [facility]   Anxiety    Asthma    sees Dr. Maple Hudson   Basal cell carcinoma 2020   abdomen - right    Carpal tunnel syndrome    sees Dr. Amanda Pea   GERD (gastroesophageal reflux disease)    Headache(784.0)    hx of pt states BP meds has improved    Hypertension    Osteoarthritis    Thyroid disease     Past Surgical History:  Procedure Laterality Date   BASAL CELL CARCINOMA EXCISION  2020   abdomen - right    BUNIONECTOMY     CARPAL TUNNEL RELEASE     CERVICAL POLYPECTOMY N/A 04/05/2015   Procedure: CERVICAL POLYPECTOMY removal ;  Surgeon: Jerene Bears, MD;  Location: [facility];  Service: Gynecology;  Laterality: N/A;   COLONOSCOPY  01/04/2018   per Dr. Lavon Paganini, adenmatous polyps, repeat in 5 yrs    DILATATION & CURRETTAGE/HYSTEROSCOPY WITH RESECTOCOPE N/A 04/05/2015   Procedure: DILATATION & CURETTAGE/HYSTEROSCOPY ;  Surgeon: Jerene Bears, MD;  Location: [facility];  Service: Gynecology;  Laterality: N/A;   DILATION AND CURETTAGE OF UTERUS     hysteroscopic polyp resection     KNEE ARTHROSCOPY     POLYPECTOMY     REFRACTIVE SURGERY     related to hole in back of left eye as stated per pt    RENAL BIOPSY     ROBOTIC ASSISTED TOTAL HYSTERECTOMY WITH BILATERAL  SALPINGO OOPHERECTOMY Bilateral 05/13/2015   Procedure: XI ROBOTIC ASSISTED TOTAL HYSTERECTOMY WITH BILATERAL SALPINGO OOPHORECTOMY;  Surgeon: Laurette Schimke, MD;  Location: [facility];  Service: Gynecology;  Laterality: Bilateral;   TOTAL KNEE ARTHROPLASTY Left 05/04/2014   Procedure: LEFT TOTAL KNEE ARTHROPLASTY;  Surgeon: Shelda Pal, MD;  Location: [facility];  Service: Orthopedics;  Laterality: Left;   TUBAL LIGATION      Allergies  Allergen Reactions   Amoxicillin Itching and Rash    Has patient had a PCN reaction causing immediate rash, facial/tongue/throat swelling, SOB or lightheadedness with hypotension: no Has patient had a PCN reaction causing severe rash involving mucus membranes or skin necrosis: no Has patient had a PCN reaction that required hospitalization no Has patient had a PCN reaction occurring within the last 10 years: no If all of the above answers are "NO", then may proceed with Cephalosporin use.     Penicillins Rash    Has patient had a PCN reaction causing immediate rash, facial/tongue/throat swelling, SOB or lightheadedness with hypotension: no Has patient had a PCN reaction causing severe rash involving mucus membranes or skin necrosis: no Has patient had a PCN reaction that required hospitalization no Has patient had a  PCN reaction occurring within the last 10 years: no If all of the above answers are "NO", then may proceed with Cephalosporin use.     Physical Exam: There were no vitals filed for this visit.  On exam there are palpable pedal pulses bilateral.  Minimal edema to the ankle bilateral.  The distal 15 to 20% of the toenails still show evidence of onychomycosis but the proximal portion looks good.  She does have a slight green hue to the toenails and states this is from the chemicals in her public pool recently.  Ankle dorsiflexion less than 10 degrees with the knee extended bilateral.  No crepitus.  No pain with range of motion  Assessment/Plan of  Care: 1. Pain due to onychomycosis of toenails of both feet      Meds ordered this encounter  Medications   terbinafine (LAMISIL) 250 MG tablet    Sig: Take 1 tablet (250 mg total) by mouth daily. Take one tablet by mouth, every other day    Dispense:  15 tablet    Refill:  0   Discussed clinical findings with patient today.  Will recommend that the patient return to Dr. Geryl Rankins typical regimen of 30-day course of oral terbinafine taken every other day for the next 30 days.  This was sent to her pharmacy with those instructions.  She will follow-up with him in approximately 3 months.  Informed her that the nails are looking really good and this may be her last course of the oral therapy.  The toenails were debrided x 10 with sterile nail nipper and power debriding bur.  Achilles tendon stretches were placed in my chart for patient to access easily and perform at home.  She feels that she is not having any improvement we can refer to physical therapy for this.  Follow-up in 3 months   Gabriela Shepherd D. Rajohn Henery, DPM, FACFAS [facility]     [address] [place], [place] [address]                Office [phone]  Fax [phone]

## 2023-02-15 NOTE — Patient Instructions (Signed)

## 2023-02-25 ENCOUNTER — Encounter: Payer: Self-pay | Admitting: Neurology

## 2023-02-25 ENCOUNTER — Other Ambulatory Visit: Payer: Self-pay | Admitting: Family Medicine

## 2023-02-26 MED ORDER — GABAPENTIN 300 MG PO CAPS: 600.0000 mg | ORAL_CAPSULE | Freq: Three times a day (TID) | ORAL | 3 refills | Status: DC

## 2023-03-28 ENCOUNTER — Telehealth: Payer: Self-pay | Admitting: Family Medicine

## 2023-03-28 NOTE — Telephone Encounter (Signed)
Prescription Request  03/28/2023  LOV: Visit date not found  What is the name of the medication or equipment? pantoprazole (PROTONIX) 40 MG tablet  furosemide (LASIX) 20 MG tablet pt would like 90 day supply  Have you contacted your pharmacy to request a refill? No   Which pharmacy would you like this sent to?  [id number] [place], [place] - [address] [place] [address] Phone: [phone] Fax: [phone]     Patient notified that their request is being sent to the clinical staff for review and that they should receive a response within 2 business days.   Please advise at Mobile [phone] (mobile)

## 2023-03-29 ENCOUNTER — Other Ambulatory Visit: Payer: Self-pay

## 2023-03-29 DIAGNOSIS — M3131 Wegener's granulomatosis with renal involvement: Secondary | ICD-10-CM

## 2023-03-29 MED ORDER — PANTOPRAZOLE SODIUM 40 MG PO TBEC: DELAYED_RELEASE_TABLET | ORAL | 0 refills | Status: DC

## 2023-03-29 MED ORDER — FUROSEMIDE 20 MG PO TABS
20.0000 mg | ORAL_TABLET | Freq: Every day | ORAL | 0 refills | Status: DC
Start: 2023-03-29 — End: 2023-07-12

## 2023-03-29 NOTE — Telephone Encounter (Signed)
Rx sent 

## 2023-04-28 ENCOUNTER — Ambulatory Visit (HOSPITAL_BASED_OUTPATIENT_CLINIC_OR_DEPARTMENT_OTHER)
Admission: RE | Admit: 2023-04-28 | Discharge: 2023-04-28 | Disposition: A | Discharge status: Home / Self Care | Payer: No Typology Code available for payment source | Source: Ambulatory Visit | Attending: Obstetrics & Gynecology | Admitting: Obstetrics & Gynecology

## 2023-04-28 DIAGNOSIS — Z1231 Encounter for screening mammogram for malignant neoplasm of breast: Secondary | ICD-10-CM | POA: Diagnosis present

## 2023-05-02 ENCOUNTER — Encounter (HOSPITAL_BASED_OUTPATIENT_CLINIC_OR_DEPARTMENT_OTHER): Payer: Self-pay | Admitting: Obstetrics & Gynecology

## 2023-05-03 ENCOUNTER — Encounter: Payer: Self-pay | Admitting: Neurology

## 2023-05-03 MED ORDER — GABAPENTIN 300 MG PO CAPS: ORAL_CAPSULE | ORAL | 3 refills | Status: DC

## 2023-05-17 ENCOUNTER — Other Ambulatory Visit: Payer: Self-pay | Admitting: Family Medicine

## 2023-05-17 ENCOUNTER — Encounter: Payer: Self-pay | Admitting: Family Medicine

## 2023-05-18 MED ORDER — VERAPAMIL HCL ER 180 MG PO TBCR: 180.0000 mg | EXTENDED_RELEASE_TABLET | Freq: Every day | ORAL | 3 refills | Status: DC

## 2023-05-18 NOTE — Telephone Encounter (Signed)
I sent this RX in

## 2023-05-24 ENCOUNTER — Encounter: Payer: Self-pay | Admitting: Podiatry

## 2023-05-24 ENCOUNTER — Ambulatory Visit: Payer: No Typology Code available for payment source | Admitting: Podiatry

## 2023-05-24 DIAGNOSIS — M79674 Pain in right toe(s): Secondary | ICD-10-CM | POA: Diagnosis not present

## 2023-05-24 DIAGNOSIS — M79675 Pain in left toe(s): Secondary | ICD-10-CM | POA: Diagnosis not present

## 2023-05-24 DIAGNOSIS — B351 Tinea unguium: Secondary | ICD-10-CM | POA: Diagnosis not present

## 2023-05-24 MED ORDER — TERBINAFINE HCL 250 MG PO TABS: 250.0000 mg | ORAL_TABLET | Freq: Every day | ORAL | 0 refills | Status: DC

## 2023-05-26 NOTE — Progress Notes (Signed)
She presents today for follow-up of her Lamisil therapy.  She states that I am not sure that they look that much better.  Objective: Vital signs are stable she is alert oriented x 3.  Toenails are long thick yellow dystrophic.  The nails are approximately 75% improved particularly smaller nails its easier to see.  Assessment long thick dystrophic nails being treated for onychomycosis with Lamisil.  Plan: Continue the use of Lamisil 30 tablets 1 p.o. q. OD and I will follow-up with her in 3 months.  I also debrided her nails for her.

## 2023-06-25 ENCOUNTER — Other Ambulatory Visit: Payer: Self-pay | Admitting: Family Medicine

## 2023-07-10 ENCOUNTER — Other Ambulatory Visit: Payer: Self-pay | Admitting: Family Medicine

## 2023-07-10 DIAGNOSIS — M3131 Wegener's granulomatosis with renal involvement: Secondary | ICD-10-CM

## 2023-07-26 ENCOUNTER — Encounter: Payer: Self-pay | Admitting: Family Medicine

## 2023-07-29 ENCOUNTER — Other Ambulatory Visit: Payer: Self-pay | Admitting: Podiatry

## 2023-08-07 NOTE — Progress Notes (Signed)
 Follow-up Visit   Date: 08/08/2023    Gabriela Shepherd MRN: [id number] DOB: 11-26-1955    Gabriela Shepherd is a 69 y.o. left-handed Caucasian female with ANCA-associated vasculitis (2020), hypothyroidism, hyperlipidemia returning to the clinic for follow-up of neuropathy.  The patient was accompanied to the clinic by self.   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  No history of diabetes, alcohol, or family history of PN. NCS/EMG was deferred.  She is having worsening pain at night time, so will try to optimize gabapentin .    - Increase gabapentin  to 600mg  in the morning, 600mg  in the afternoon, and 1200mg  at bedtime (when pain is less severe, she can take 900mg  at bedtime).    Return to clinic in 6 months  --------------------------------------------- History of present illness: For the past 5 years, she has shooting pain in the feet and toes with associated numbness.  She used to get it in the lower legs, but this has improved on gabapentin  300mg  in the morning and 900mg  at bedtime.  Unfortunately, the shooting pain in the toes is unchanged. Symptoms are constant.  Pain is worse when anything touches her feet, such as bed sheets.  She was diagnosed with ANCA-vasculitis and takes rituximab infusions. She recalls that her feet pain was worse before she was treated for vasculitis.    She works at [employer].  She lives alone in a apartment.   UPDATE 08/02/2022:  She is here for follow-up visit.  She continues to have burning and shooting pain in the balls of the feet and toes, despite being on gabapentin  600mg  AM and 900mg  at bedtime.  She denies medication side effects. Her leg pain has significant improved.  She endorses some imbalance, but also feels this may be contributed by her knee pain.    UPDATE 02/05/2023:  She is here for 6 month follow-up.  She noticed reduced pain with increasing gabapentin  600mg  three  times daily.  Overall, pain is controlled about 75% but recently, she has been having spells of sensation of something wet on her legs or cool. She still has burning pain which pain in the feet, which occurs about once per week which is lasts a few seconds.  She has not had any falls.   UPDATE 08/08/2023:  She is here for 6 month follow-up.  She reports having burning pain involving the balls of the feet which is severe 3 of 7 nights.  She takes gabapentin  600mg  in the morning, 600mg  in the afternoon, and 900mg  at bedtime but still has breakthrough pain.  She applies frankinsence and myrrh cream which she has good relief with.  No interval falls.  She uses a cane for support.  She has chronic bilateral knee pain.   Medications:  Current Outpatient Medications on File Prior to Visit  Medication Sig Dispense Refill   furosemide  (LASIX ) 20 MG tablet TAKE 1 TABLET BY MOUTH EVERY DAY 30 tablet 0   levothyroxine  (SYNTHROID ) 50 MCG tablet TAKE 1 TABLET BY MOUTH EVERY DAY BEFORE BREAKFAST 90 tablet 0   losartan -hydrochlorothiazide  (HYZAAR) 100-25 MG tablet TAKE 1 TABLET BY MOUTH EVERY DAY 90 tablet 1   Multiple Vitamin (MULTIVITAMIN) tablet Take 1 tablet by mouth every morning.      pantoprazole  (PROTONIX ) 40 MG tablet TAKE 1 TABLET BY MOUTH EVERY DAY IN THE MORNING 90 tablet 0   verapamil  (CALAN -SR) 180 MG CR tablet Take 1 tablet (180 mg total) by mouth at bedtime. 90  tablet 3   XIIDRA 5 % SOLN Apply 1 drop to eye 2 (two) times daily.     No current facility-administered medications on file prior to visit.    Allergies:  Allergies  Allergen Reactions   Amoxicillin Itching and Rash    Has patient had a PCN reaction causing immediate rash, facial/tongue/throat swelling, SOB or lightheadedness with hypotension: no Has patient had a PCN reaction causing severe rash involving mucus membranes or skin necrosis: no Has patient had a PCN reaction that required hospitalization no Has patient had a PCN  reaction occurring within the last 10 years: no If all of the above answers are "NO", then may proceed with Cephalosporin use.     Penicillins Rash    Has patient had a PCN reaction causing immediate rash, facial/tongue/throat swelling, SOB or lightheadedness with hypotension: no Has patient had a PCN reaction causing severe rash involving mucus membranes or skin necrosis: no Has patient had a PCN reaction that required hospitalization no Has patient had a PCN reaction occurring within the last 10 years: no If all of the above answers are "NO", then may proceed with Cephalosporin use.     Vital Signs:  BP (!) 152/80   Pulse 92   Ht 5\' 8"  (1.727 m)   Wt 290 lb 6.4 oz (131.7 kg)   LMP 11/21/2009   SpO2 95%   BMI 44.16 kg/m   Neurological Exam: MENTAL STATUS including orientation to time, place, person, recent and remote memory, attention span and concentration, language, and fund of knowledge is normal.  Speech is not dysarthric.  CRANIAL NERVES:.  Normal conjugate, extra-ocular eye movements in all directions of gaze.  No ptosis.  Face is symmetric.   MOTOR:  Motor strength is 5/5 in all extremities.  No atrophy, fasciculations or abnormal movements.  No pronator drift.   SENSORY:  Vibration reduced at the feet bilaterally.  Temperature reduced from lower leg distally.   COORDINATION/GAIT:  Gait is mildly wide-based, antalgic due to bilateral knee pain, unassisted. She has a cane which she uses as needed.  Data: n/a   Thank you for allowing me to participate in patient's care.  If I can answer any additional questions, I would be pleased to do so.    Sincerely,    Jalen Daluz K. Lydia Sams, DO

## 2023-08-08 ENCOUNTER — Encounter: Payer: Self-pay | Admitting: Neurology

## 2023-08-08 ENCOUNTER — Ambulatory Visit: Payer: No Typology Code available for payment source | Admitting: Neurology

## 2023-08-08 VITALS — BP 152/80 | HR 92 | Ht 68.0 in | Wt 290.4 lb

## 2023-08-08 DIAGNOSIS — I7782 Antineutrophilic cytoplasmic antibody (ANCA) vasculitis: Secondary | ICD-10-CM | POA: Diagnosis not present

## 2023-08-08 DIAGNOSIS — G63 Polyneuropathy in diseases classified elsewhere: Secondary | ICD-10-CM

## 2023-08-08 MED ORDER — GABAPENTIN 600 MG PO TABS: ORAL_TABLET | ORAL | 3 refills | Status: AC

## 2023-08-08 NOTE — Patient Instructions (Signed)
 You may take an extra 300mg  at bedtime as needed for severe pain  You can also try lidocaine  cream

## 2023-08-11 ENCOUNTER — Other Ambulatory Visit: Payer: Self-pay | Admitting: Family Medicine

## 2023-08-11 DIAGNOSIS — M3131 Wegener's granulomatosis with renal involvement: Secondary | ICD-10-CM

## 2023-08-20 ENCOUNTER — Other Ambulatory Visit: Payer: Self-pay | Admitting: Family Medicine

## 2023-08-23 ENCOUNTER — Encounter: Payer: Self-pay | Admitting: Podiatry

## 2023-08-23 ENCOUNTER — Ambulatory Visit: Payer: No Typology Code available for payment source | Admitting: Podiatry

## 2023-08-23 DIAGNOSIS — M79675 Pain in left toe(s): Secondary | ICD-10-CM

## 2023-08-23 DIAGNOSIS — B351 Tinea unguium: Secondary | ICD-10-CM

## 2023-08-23 DIAGNOSIS — G5793 Unspecified mononeuropathy of bilateral lower limbs: Secondary | ICD-10-CM | POA: Diagnosis not present

## 2023-08-23 DIAGNOSIS — M79674 Pain in right toe(s): Secondary | ICD-10-CM

## 2023-08-23 DIAGNOSIS — M2011 Hallux valgus (acquired), right foot: Secondary | ICD-10-CM | POA: Diagnosis not present

## 2023-08-23 NOTE — Progress Notes (Signed)
She presents today for follow-up of her nail fungus she is completed her fifth every other day dose stating I think that there is grown out of the groin again.  She is for likely have not improved that much other than the color.  She also Questions today whether or not she has some issue with her first metatarsophalangeal joint stating that she had a bunion repair on that right first metatarsophalangeal joint when she was in college.  She states that sometimes she gets a sharp zapping pain in that area.  Objective: Vital signs are stable she is alert and oriented x 3.  Her toenails are very clear she does have some nail dystrophy associated particularly with her hammertoe deformities to this resulting in a distal clavus to the toes as well as some nail dystrophy to the distal margin of the nails.  The rest of the nails themselves look much better to me.  She has some tenderness on palpation and range of motion of the first metatarsophalangeal joint though she states it really does not hurt she just does not like people touching her feet.  There is no crepitation on range of motion but she has good full dorsal excursion with slightly limited plantar excursion and some tenderness on palpation of the sesamoids of the first metatarsal phalangeal joint.  No x-rays were performed today.  Assessment: Onychomycosis completed treatment with Lamisil.  Capsulitis neuritis first metatarsophalangeal joint right foot.  Plan: Discontinue use of the oral medication follow-up with me after the first metatarsophalangeal joint worsen.

## 2023-08-30 ENCOUNTER — Telehealth (INDEPENDENT_AMBULATORY_CARE_PROVIDER_SITE_OTHER): Payer: Self-pay | Admitting: Otolaryngology

## 2023-08-30 NOTE — Telephone Encounter (Signed)
 Confirmed appt and address with patient for 08/31/2023

## 2023-08-31 ENCOUNTER — Ambulatory Visit (INDEPENDENT_AMBULATORY_CARE_PROVIDER_SITE_OTHER): Payer: No Typology Code available for payment source | Admitting: Otolaryngology

## 2023-08-31 VITALS — BP 153/76 | HR 95

## 2023-08-31 DIAGNOSIS — J310 Chronic rhinitis: Secondary | ICD-10-CM

## 2023-08-31 DIAGNOSIS — R0981 Nasal congestion: Secondary | ICD-10-CM

## 2023-08-31 DIAGNOSIS — H6982 Other specified disorders of Eustachian tube, left ear: Secondary | ICD-10-CM | POA: Diagnosis not present

## 2023-08-31 DIAGNOSIS — J343 Hypertrophy of nasal turbinates: Secondary | ICD-10-CM

## 2023-09-01 DIAGNOSIS — H6982 Other specified disorders of Eustachian tube, left ear: Secondary | ICD-10-CM | POA: Insufficient documentation

## 2023-09-01 DIAGNOSIS — J343 Hypertrophy of nasal turbinates: Secondary | ICD-10-CM | POA: Insufficient documentation

## 2023-09-01 DIAGNOSIS — J310 Chronic rhinitis: Secondary | ICD-10-CM | POA: Insufficient documentation

## 2023-09-01 NOTE — Progress Notes (Signed)
 Patient ID: Gabriela Shepherd, female   DOB: 1956-05-26, 68 y.o.   MRN: [id number]  Follow-up: Left ear eustachian tube dysfunction, chronic nasal congestion  HPI: The patient is a 68 year old female who returns today for her follow-up evaluation.  She was last seen 6 months ago.  At that time, she was complaining of chronic nasal congestion and clogging sensation in her left ear.  She was noted to have nasal mucosal congestion, bilateral inferior turbinate hypertrophy, and left ear eustachian tube dysfunction.  She was treated with Flonase nasal spray and Valsalva exercise.  The patient returns today complaining of persistent clogging sensation in her left ear.  She also has occasional nasal congestion.  However, the severity of the nasal congestion has decreased.  Currently she denies any otalgia, otorrhea, facial pain, or fever.  Exam: General: Communicates without difficulty, well nourished, no acute distress. Head: Normocephalic, no evidence injury, no tenderness, facial buttresses intact without stepoff. Face/sinus: No tenderness to palpation and percussion. Facial movement is normal and symmetric. Eyes: PERRL, EOMI. No scleral icterus, conjunctivae clear. Neuro: CN II exam reveals vision grossly intact.  No nystagmus at any point of gaze. Ears: Auricles well formed without lesions.  Ear canals are intact without mass or lesion.  No erythema or edema is appreciated.  The TMs are intact without fluid. Nose: External evaluation reveals normal support and skin without lesions.  Dorsum is intact.  Anterior rhinoscopy reveals congested mucosa over anterior aspect of inferior turbinates and intact septum.  No purulence noted. Oral:  Oral cavity and oropharynx are intact, symmetric, without erythema or edema.  Mucosa is moist without lesions. Neck: Full range of motion without pain.  There is no significant lymphadenopathy.  No masses palpable.  Thyroid  bed within normal limits to palpation.  Parotid glands and  submandibular glands equal bilaterally without mass.  Trachea is midline. Neuro:  CN 2-12 grossly intact.   Assessment: 1.  Chronic rhinitis with nasal mucosal congestion and bilateral inferior turbinate hypertrophy.  The severity of her nasal congestion has decreased. 2.  Persistent left ear eustachian tube dysfunction.  Plan: 1.  The physical exam findings are reviewed with the patient. 2.  Continue with Flonase nasal spray and Valsalva exercise. 3.  The patient is reassured that no infection is noted today. 4.  If her eustachian tube dysfunction worsens, she may benefit from myringotomy and tube placement. 5.  The patient is encouraged to call with any questions or concerns.

## 2023-09-14 ENCOUNTER — Other Ambulatory Visit: Payer: Self-pay | Admitting: Family Medicine

## 2023-09-14 DIAGNOSIS — M3131 Wegener's granulomatosis with renal involvement: Secondary | ICD-10-CM

## 2023-09-17 ENCOUNTER — Encounter: Payer: Self-pay | Admitting: Family Medicine

## 2023-09-17 ENCOUNTER — Ambulatory Visit (INDEPENDENT_AMBULATORY_CARE_PROVIDER_SITE_OTHER): Payer: No Typology Code available for payment source | Admitting: Family Medicine

## 2023-09-17 VITALS — BP 120/70 | HR 78 | Temp 97.6°F | Ht 68.0 in | Wt 292.0 lb

## 2023-09-17 DIAGNOSIS — E785 Hyperlipidemia, unspecified: Secondary | ICD-10-CM

## 2023-09-17 DIAGNOSIS — R739 Hyperglycemia, unspecified: Secondary | ICD-10-CM

## 2023-09-17 DIAGNOSIS — E039 Hypothyroidism, unspecified: Secondary | ICD-10-CM | POA: Diagnosis not present

## 2023-09-17 DIAGNOSIS — Z23 Encounter for immunization: Secondary | ICD-10-CM | POA: Diagnosis not present

## 2023-09-17 DIAGNOSIS — M3131 Wegener's granulomatosis with renal involvement: Secondary | ICD-10-CM

## 2023-09-17 DIAGNOSIS — Z0000 Encounter for general adult medical examination without abnormal findings: Secondary | ICD-10-CM

## 2023-09-17 DIAGNOSIS — R7303 Prediabetes: Secondary | ICD-10-CM

## 2023-09-17 LAB — CBC WITH DIFFERENTIAL/PLATELET
Basophils Absolute: 0.1 10*3/uL (ref 0.0–0.1)
Basophils Relative: 0.8 % (ref 0.0–3.0)
Eosinophils Absolute: 0.1 10*3/uL (ref 0.0–0.7)
Eosinophils Relative: 1.8 % (ref 0.0–5.0)
HCT: 40.1 % (ref 36.0–46.0)
Hemoglobin: 13.3 g/dL (ref 12.0–15.0)
Lymphocytes Relative: 25.7 % (ref 12.0–46.0)
Lymphs Abs: 1.8 10*3/uL (ref 0.7–4.0)
MCHC: 33.2 g/dL (ref 30.0–36.0)
MCV: 87.4 fl (ref 78.0–100.0)
Monocytes Absolute: 0.5 10*3/uL (ref 0.1–1.0)
Monocytes Relative: 6.7 % (ref 3.0–12.0)
Neutro Abs: 4.6 10*3/uL (ref 1.4–7.7)
Neutrophils Relative %: 65 % (ref 43.0–77.0)
Platelets: 250 10*3/uL (ref 150.0–400.0)
RBC: 4.58 Mil/uL (ref 3.87–5.11)
RDW: 14.4 % (ref 11.5–15.5)
WBC: 7.1 10*3/uL (ref 4.0–10.5)

## 2023-09-17 LAB — TSH: TSH: 2.44 u[IU]/mL (ref 0.35–5.50)

## 2023-09-17 LAB — BASIC METABOLIC PANEL
BUN: 20 mg/dL (ref 6–23)
CO2: 28 meq/L (ref 19–32)
Calcium: 9.3 mg/dL (ref 8.4–10.5)
Chloride: 105 meq/L (ref 96–112)
Creatinine, Ser: 0.65 mg/dL (ref 0.40–1.20)
GFR: 91.97 mL/min (ref >60.00)
Glucose, Bld: 104 mg/dL — ABNORMAL HIGH (ref 70–99)
Potassium: 3.7 meq/L (ref 3.5–5.1)
Sodium: 143 meq/L (ref 135–145)

## 2023-09-17 LAB — LIPID PANEL
Cholesterol: 221 mg/dL — ABNORMAL HIGH (ref 0–200)
HDL: 53.6 mg/dL (ref >39.00)
LDL Cholesterol: 144 mg/dL — ABNORMAL HIGH (ref 0–99)
NonHDL: 167.39
Total CHOL/HDL Ratio: 4
Triglycerides: 117 mg/dL (ref 0.0–149.0)
VLDL: 23.4 mg/dL (ref 0.0–40.0)

## 2023-09-17 LAB — HEMOGLOBIN A1C: Hgb A1c MFr Bld: 6.5 % (ref 4.6–6.5)

## 2023-09-17 LAB — HEPATIC FUNCTION PANEL
ALT: 19 U/L (ref 0–35)
AST: 20 U/L (ref 0–37)
Albumin: 4.3 g/dL (ref 3.5–5.2)
Alkaline Phosphatase: 76 U/L (ref 39–117)
Bilirubin, Direct: 0 mg/dL (ref 0.0–0.3)
Total Bilirubin: 0.4 mg/dL (ref 0.2–1.2)
Total Protein: 7.5 g/dL (ref 6.0–8.3)

## 2023-09-17 LAB — T3, FREE: T3, Free: 3.7 pg/mL (ref 2.3–4.2)

## 2023-09-17 LAB — T4, FREE: Free T4: 0.85 ng/dL (ref 0.60–1.60)

## 2023-09-17 MED ORDER — FUROSEMIDE 20 MG PO TABS
20.0000 mg | ORAL_TABLET | Freq: Every day | ORAL | 3 refills | Status: AC
Start: 2023-09-17 — End: ?

## 2023-09-17 MED ORDER — LEVOTHYROXINE SODIUM 50 MCG PO TABS: 50.0000 ug | ORAL_TABLET | Freq: Every day | ORAL | 3 refills | Status: AC

## 2023-09-17 MED ORDER — PANTOPRAZOLE SODIUM 40 MG PO TBEC: DELAYED_RELEASE_TABLET | ORAL | 0 refills | Status: DC

## 2023-09-17 NOTE — Progress Notes (Signed)
 Subjective:    Patient ID: Gabriela Shepherd, female    DOB: 12-Jul-1956, 68 y.o.   MRN: [id number]  HPI Here for a well exam. She feels well except for her chronic knee pains, and she has dry cracking skin on the tips of several fingers. She has tried several moisturizing lotions with no relief.    Review of Systems  Constitutional: Negative.   HENT: Negative.    Eyes: Negative.   Respiratory: Negative.    Cardiovascular: Negative.   Gastrointestinal: Negative.   Genitourinary:  Negative for decreased urine volume, difficulty urinating, dyspareunia, dysuria, enuresis, flank pain, frequency, hematuria, pelvic pain and urgency.  Musculoskeletal: Negative.   Skin: Negative.   Neurological: Negative.  Negative for headaches.  Psychiatric/Behavioral: Negative.         Objective:   Physical Exam Constitutional:      General: She is not in acute distress.    Appearance: She is well-developed. She is obese.     Comments: Walks with a cane   HENT:     Head: Normocephalic and atraumatic.     Right Ear: External ear normal.     Left Ear: External ear normal.     Nose: Nose normal.     Mouth/Throat:     Pharynx: No oropharyngeal exudate.  Eyes:     General: No scleral icterus.    Conjunctiva/sclera: Conjunctivae normal.     Pupils: Pupils are equal, round, and reactive to light.  Neck:     Thyroid: No thyromegaly.     Vascular: No JVD.  Cardiovascular:     Rate and Rhythm: Normal rate and regular rhythm.     Pulses: Normal pulses.     Heart sounds: Normal heart sounds. No murmur heard.    No friction rub. No gallop.  Pulmonary:     Effort: Pulmonary effort is normal. No respiratory distress.     Breath sounds: Normal breath sounds. No wheezing or rales.  Chest:     Chest wall: No tenderness.  Abdominal:     General: Bowel sounds are normal. There is no distension.     Palpations: Abdomen is soft. There is no mass.     Tenderness: There is no abdominal tenderness. There  is no guarding or rebound.  Musculoskeletal:        General: No tenderness. Normal range of motion.     Cervical back: Normal range of motion and neck supple.  Lymphadenopathy:     Cervical: No cervical adenopathy.  Skin:    General: Skin is warm and dry.     Findings: No erythema or rash.     Comments: The tips of several fingers on the right hand are hypertrophic and cracked   Neurological:     General: No focal deficit present.     Mental Status: She is alert and oriented to person, place, and time.     Cranial Nerves: No cranial nerve deficit.     Motor: No abnormal muscle tone.     Coordination: Coordination normal.     Deep Tendon Reflexes: Reflexes are normal and symmetric. Reflexes normal.  Psychiatric:        Mood and Affect: Mood normal.        Behavior: Behavior normal.        Thought Content: Thought content normal.        Judgment: Judgment normal.           Assessment & Plan:  Well exam. We discussed  diet and exercise. Get fasting labs. For the cracked skin, I suggested she apply Vaseline to the hand and to cover this with a white cotton glove every night.  Gershon Crane, MD

## 2023-09-19 ENCOUNTER — Other Ambulatory Visit: Payer: Self-pay | Admitting: Family Medicine

## 2023-09-19 NOTE — Addendum Note (Signed)
 Addended by: Johnella Moloney on: 09/19/2023 02:39 PM   Modules accepted: Orders

## 2023-10-21 ENCOUNTER — Emergency Department (HOSPITAL_BASED_OUTPATIENT_CLINIC_OR_DEPARTMENT_OTHER): Admitting: Radiology

## 2023-10-21 ENCOUNTER — Emergency Department (HOSPITAL_BASED_OUTPATIENT_CLINIC_OR_DEPARTMENT_OTHER)
Admission: EM | Admit: 2023-10-21 | Discharge: 2023-10-21 | Disposition: A | Discharge status: Home / Self Care | Attending: Emergency Medicine | Admitting: Emergency Medicine

## 2023-10-21 ENCOUNTER — Encounter (HOSPITAL_BASED_OUTPATIENT_CLINIC_OR_DEPARTMENT_OTHER): Payer: Self-pay

## 2023-10-21 ENCOUNTER — Other Ambulatory Visit: Payer: Self-pay

## 2023-10-21 ENCOUNTER — Emergency Department (HOSPITAL_BASED_OUTPATIENT_CLINIC_OR_DEPARTMENT_OTHER)

## 2023-10-21 DIAGNOSIS — S51832A Puncture wound without foreign body of left forearm, initial encounter: Secondary | ICD-10-CM | POA: Insufficient documentation

## 2023-10-21 DIAGNOSIS — I10 Essential (primary) hypertension: Secondary | ICD-10-CM | POA: Insufficient documentation

## 2023-10-21 DIAGNOSIS — L03114 Cellulitis of left upper limb: Secondary | ICD-10-CM | POA: Insufficient documentation

## 2023-10-21 DIAGNOSIS — W5501XA Bitten by cat, initial encounter: Secondary | ICD-10-CM | POA: Insufficient documentation

## 2023-10-21 DIAGNOSIS — E039 Hypothyroidism, unspecified: Secondary | ICD-10-CM | POA: Diagnosis not present

## 2023-10-21 DIAGNOSIS — Z79899 Other long term (current) drug therapy: Secondary | ICD-10-CM | POA: Insufficient documentation

## 2023-10-21 DIAGNOSIS — Z79890 Hormone replacement therapy (postmenopausal): Secondary | ICD-10-CM | POA: Diagnosis not present

## 2023-10-21 LAB — LACTIC ACID, PLASMA
Lactic Acid, Venous: 2 mmol/L (ref 0.5–1.9)
Lactic Acid, Venous: 1.7 mmol/L (ref 0.5–1.9)

## 2023-10-21 LAB — CBC WITH DIFFERENTIAL/PLATELET
Abs Immature Granulocytes: 0.02 10*3/uL (ref 0.00–0.07)
Basophils Absolute: 0.1 10*3/uL (ref 0.0–0.1)
Basophils Relative: 1 %
Eosinophils Absolute: 0.1 10*3/uL (ref 0.0–0.5)
Eosinophils Relative: 1 %
HCT: 38.9 % (ref 36.0–46.0)
Hemoglobin: 13 g/dL (ref 12.0–15.0)
Immature Granulocytes: 0 %
Lymphocytes Relative: 17 %
Lymphs Abs: 1.5 10*3/uL (ref 0.7–4.0)
MCH: 29.1 pg (ref 26.0–34.0)
MCHC: 33.4 g/dL (ref 30.0–36.0)
MCV: 87.2 fL (ref 80.0–100.0)
Monocytes Absolute: 0.5 10*3/uL (ref 0.1–1.0)
Monocytes Relative: 6 %
Neutro Abs: 6.7 10*3/uL (ref 1.7–7.7)
Neutrophils Relative %: 75 %
Platelets: 220 10*3/uL (ref 150–400)
RBC: 4.46 MIL/uL (ref 3.87–5.11)
RDW: 13.7 % (ref 11.5–15.5)
WBC: 8.9 10*3/uL (ref 4.0–10.5)
nRBC: 0 % (ref 0.0–0.2)

## 2023-10-21 LAB — COMPREHENSIVE METABOLIC PANEL WITH GFR
ALT: 17 U/L (ref 0–44)
AST: 24 U/L (ref 15–41)
Albumin: 4.1 g/dL (ref 3.5–5.0)
Alkaline Phosphatase: 80 U/L (ref 38–126)
Anion gap: 12 (ref 5–15)
BUN: 17 mg/dL (ref 8–23)
CO2: 23 mmol/L (ref 22–32)
Calcium: 9.1 mg/dL (ref 8.9–10.3)
Chloride: 103 mmol/L (ref 98–111)
Creatinine, Ser: 0.72 mg/dL (ref 0.44–1.00)
GFR, Estimated: >60 mL/min (ref >60)
Glucose, Bld: 152 mg/dL — ABNORMAL HIGH (ref 70–99)
Potassium: 4.5 mmol/L (ref 3.5–5.1)
Sodium: 138 mmol/L (ref 135–145)
Total Bilirubin: 0.6 mg/dL (ref 0.0–1.2)
Total Protein: 7.3 g/dL (ref 6.5–8.1)

## 2023-10-21 MED ORDER — IOHEXOL 300 MG/ML  SOLN
75.0000 mL | Freq: Once | INTRAMUSCULAR | Status: AC | PRN
  Administered 2023-10-21: 75 mL via INTRAVENOUS

## 2023-10-21 MED ORDER — METRONIDAZOLE 500 MG PO TABS: 500.0000 mg | ORAL_TABLET | Freq: Two times a day (BID) | ORAL | 0 refills | Status: DC

## 2023-10-21 MED ORDER — DOXYCYCLINE HYCLATE 100 MG PO TABS
100.0000 mg | ORAL_TABLET | Freq: Once | ORAL | Status: AC
  Administered 2023-10-21: 100 mg via ORAL
  Filled 2023-10-21: qty 1

## 2023-10-21 MED ORDER — TETANUS-DIPHTH-ACELL PERTUSSIS 5-2.5-18.5 LF-MCG/0.5 IM SUSY: 0.5000 mL | PREFILLED_SYRINGE | Freq: Once | INTRAMUSCULAR | Status: DC

## 2023-10-21 MED ORDER — DOXYCYCLINE HYCLATE 100 MG PO CAPS: 100.0000 mg | ORAL_CAPSULE | Freq: Two times a day (BID) | ORAL | 0 refills | Status: AC

## 2023-10-21 MED ORDER — METRONIDAZOLE 500 MG PO TABS
500.0000 mg | ORAL_TABLET | Freq: Once | ORAL | Status: AC
  Administered 2023-10-21: 500 mg via ORAL
  Filled 2023-10-21: qty 1

## 2023-10-21 NOTE — ED Provider Notes (Signed)
 [place] EMERGENCY DEPARTMENT AT [facility] Provider Note   CSN: [id number] Arrival date & time: 10/21/23  0865     History  Chief Complaint  Patient presents with   Animal Bite    Cat   Wound Check    Gabriela Shepherd is a 68 y.o. female.  The history is provided by the patient and medical records. No language interpreter was used.  Animal Bite Contact animal:  Cat Location:  Shoulder/arm Shoulder/arm injury location:  L forearm Time since incident:  3 days Pain details:    Quality:  Aching   Severity:  Moderate   Timing:  Constant   Progression:  Worsening Incident location:  Home Provoked: provoked   Notifications:  None Animal's rabies vaccination status:  Up to date Animal in possession: yes   Tetanus status:  Up to date Relieved by:  Nothing Worsened by:  Nothing Ineffective treatments:  None tried Associated symptoms: rash and swelling   Associated symptoms: no fever and no numbness   Wound Check Pertinent negatives include no chest pain, no abdominal pain, no headaches and no shortness of breath.       Home Medications Prior to Admission medications   Medication Sig Start Date End Date Taking? Authorizing Provider  furosemide (LASIX) 20 MG tablet Take 1 tablet (20 mg total) by mouth daily. 09/17/23   Nelwyn Salisbury, MD  gabapentin (NEURONTIN) 600 MG tablet Take 1 tablet in the morning, 1 tablet in the afternoon, and 2 tablets at bedtime. 08/08/23   Nita Sickle K, DO  levothyroxine (SYNTHROID) 50 MCG tablet Take 1 tablet (50 mcg total) by mouth daily. 09/17/23   Nelwyn Salisbury, MD  losartan-hydrochlorothiazide [facility]) 100-25 MG tablet TAKE 1 TABLET BY MOUTH EVERY DAY 08/13/23   Nelwyn Salisbury, MD  Multiple Vitamin (MULTIVITAMIN) tablet Take 1 tablet by mouth every morning.     [provider]  pantoprazole (PROTONIX) 40 MG tablet TAKE 1 TABLET BY MOUTH EVERY DAY IN THE MORNING 09/17/23   Nelwyn Salisbury, MD  verapamil (CALAN-SR) 180 MG  CR tablet Take 1 tablet (180 mg total) by mouth at bedtime. 05/18/23   Nelwyn Salisbury, MD  XIIDRA 5 % SOLN Apply 1 drop to eye 2 (two) times daily.    [provider]      Allergies    Amoxicillin and Penicillins    Review of Systems   Review of Systems  Constitutional:  Positive for chills. Negative for fatigue and fever.  HENT:  Negative for congestion.   Respiratory:  Negative for cough, chest tightness, shortness of breath and wheezing.   Cardiovascular:  Negative for chest pain and palpitations.  Gastrointestinal:  Negative for abdominal pain, constipation, diarrhea, nausea and vomiting.  Genitourinary:  Negative for dysuria.  Musculoskeletal:  Negative for back pain, neck pain and neck stiffness.  Skin:  Positive for rash and wound.  Neurological:  Negative for weakness, light-headedness, numbness and headaches.  Psychiatric/Behavioral:  Negative for agitation.   All other systems reviewed and are negative.   Physical Exam Updated Vital Signs BP (!) 182/73 (BP Location: Right Arm)   Pulse 93   Temp 98.3 F (36.8 C) (Oral)   Resp 20   Ht 5\' 8"  (1.727 m)   Wt 131.5 kg   LMP 11/21/2009   SpO2 96%   BMI 44.09 kg/m  Physical Exam Vitals and nursing note reviewed.  Constitutional:      General: She is not in acute  distress.    Appearance: She is well-developed. She is not ill-appearing, toxic-appearing or diaphoretic.  HENT:     Head: Normocephalic and atraumatic.     Nose: Nose normal.     Mouth/Throat:     Mouth: Mucous membranes are moist.  Eyes:     Conjunctiva/sclera: Conjunctivae normal.  Cardiovascular:     Rate and Rhythm: Normal rate and regular rhythm.     Heart sounds: No murmur heard. Pulmonary:     Effort: Pulmonary effort is normal. No respiratory distress.     Breath sounds: Normal breath sounds. No wheezing, rhonchi or rales.  Chest:     Chest wall: No tenderness.  Abdominal:     General: Abdomen is flat.     Palpations: Abdomen is  soft.     Tenderness: There is no abdominal tenderness. There is no guarding or rebound.  Musculoskeletal:        General: Swelling and tenderness present.     Left forearm: Swelling and tenderness present.     Cervical back: Neck supple.     Comments: Left forearm has several bite marks with surrounding erythema tenderness and swelling.  No initial fluctuance or crepitance.  Erythema does appear to be spreading from the site.  Skin:    General: Skin is warm and dry.     Capillary Refill: Capillary refill takes less than 2 seconds.     Findings: Erythema present.  Neurological:     General: No focal deficit present.     Mental Status: She is alert.     Sensory: No sensory deficit.     Motor: No weakness.  Psychiatric:        Mood and Affect: Mood normal.          ED Results / Procedures / Treatments   Labs (all labs ordered are listed, but only abnormal results are displayed) Labs Reviewed  COMPREHENSIVE METABOLIC PANEL WITH GFR - Abnormal; Notable for the following components:      Result Value   Glucose, Bld 152 (*)    All other components within normal limits  LACTIC ACID, PLASMA - Abnormal; Notable for the following components:   Lactic Acid, Venous 2.0 (*)    All other components within normal limits  CULTURE, BLOOD (ROUTINE X 2)  CULTURE, BLOOD (ROUTINE X 2)  CBC WITH DIFFERENTIAL/PLATELET  LACTIC ACID, PLASMA    EKG None  Radiology CT FOREARM LEFT W CONTRAST Result Date: 10/21/2023 CLINICAL DATA:  Cat bite with superficial infection. Erythematous areas along the posterolateral aspect of the mid forearm. Evaluate for deep abscess. EXAM: CT OF THE UPPER LEFT EXTREMITY WITH CONTRAST TECHNIQUE: Multidetector CT imaging of the left forearm was performed according to the standard protocol following intravenous contrast administration. RADIATION DOSE REDUCTION: This exam was performed according to the departmental dose-optimization program which includes automated  exposure control, adjustment of the mA and/or kV according to patient size and/or use of iterative reconstruction technique. CONTRAST:  75mL OMNIPAQUE IOHEXOL 300 MG/ML  SOLN COMPARISON:  Radiographs same date. FINDINGS: Bones/Joint/Cartilage No evidence of acute fracture, dislocation or osteomyelitis. There is no evidence of elbow joint effusion. No evidence of significant fluid within the wrist joint compartments. There is an ulnar minus variance at the wrist with degenerative changes at the distal radioulnar joint. Additional mild degenerative changes are present at the 1st carpometacarpal and scaphotrapeziotrapezoidal joints. Ligaments Suboptimally assessed by CT. Muscles and Tendons The forearm muscles appear unremarkable as evaluated by CT. No evidence of  focal intramuscular fluid collection, abnormal enhancement or soft tissue emphysema. There appears to be a small amount of flexor tendon fluid within the carpal tunnel without evidence of associated foreign body or soft tissue emphysema. No other significant tendon abnormalities identified. Soft tissues Subcutaneous edema and mild skin thickening noted proximally in the forearm, greatest posteriorly and laterally. Possible additional mild subcutaneous edema in the palmar aspect of the wrist, superficial to the flexor retinaculum. No deep inflammatory changes are identified. No evidence of focal fluid collection, foreign body or soft tissue emphysema. No acute vascular findings. IMPRESSION: 1. Subcutaneous edema and mild skin thickening proximally in the forearm, greatest posteriorly and laterally, consistent with cellulitis. No evidence of focal fluid collection, foreign body or soft tissue emphysema. 2. Possible additional mild subcutaneous edema in the palmar aspect of the wrist with underlying mild nonspecific carpal tunnel flexor tendon fluid. 3. No evidence of deep soft tissue infection, abscess osteomyelitis or septic arthritis. 4. Ulnar minus variance  at the wrist with degenerative changes at the distal radioulnar joint. Electronically Signed   By: Carey Bullocks M.D.   On: 10/21/2023 11:50   DG Forearm Left Result Date: 10/21/2023 CLINICAL DATA:  Cat bite 2 days ago. Erythema and swelling. Evaluate for foreign body and soft tissue gas. EXAM: LEFT FOREARM - 2 VIEW COMPARISON:  None Available. FINDINGS: The mineralization and alignment are normal. There is no evidence of acute fracture or dislocation. Mild degenerative changes at the elbow and moderate degenerative changes at the distal radioulnar joint. There is an ulnar minus variance. No evidence of osteomyelitis or significant elbow joint effusion. No evidence of foreign body or soft tissue emphysema. IMPRESSION: No acute osseous findings or evidence of foreign body. Degenerative changes as described. Electronically Signed   By: Carey Bullocks M.D.   On: 10/21/2023 08:51    Procedures Procedures    Medications Ordered in ED Medications  iohexol (OMNIPAQUE) 300 MG/ML solution 75 mL (75 mLs Intravenous Contrast Given 10/21/23 1017)  doxycycline (VIBRA-TABS) tablet 100 mg (100 mg Oral Given 10/21/23 1340)  metroNIDAZOLE (FLAGYL) tablet 500 mg (500 mg Oral Given 10/21/23 1340)    ED Course/ Medical Decision Making/ A&P                                 Medical Decision Making Amount and/or Complexity of Data Reviewed Labs: ordered. Radiology: ordered.  Risk Prescription drug management.    Gabriela Shepherd is a 68 y.o. female with a past medical history significant for hypertension, osteoarthritis, hypothyroidism, previous ANCA associated vasculitis, previous hysterectomy, and anxiety who presents for cat bite.  According to patient, her cat that is up-to-date with immunizations had some sort of scrape or cut and the patient was picking the cat up upside down to look in the cat did not like this so it bit her in the left forearm.  Patient reports it was an extremely deep bite and  patient thought "if she pulled too hard might rip a chunk out of her arm".  Patient was able to remove the cat and saw the small bite marks.  She reports that yesterday on Saturday, there was some purulence coming out of the wounds that has stopped today.  She reports over the last 24 hours the redness has spread and it is more swollen and painful.  She has had some chills but denies any documented fevers at home.  She denies any nausea,  vomiting, or other symptoms.  Patient was feeling well for the cat bite.  Patient says she is up-to-date on her tetanus shot is never had an injury like this before.  Patient is left-handed.  On exam, patient has several puncture wounds to her left proximal forearm with surrounding erythema and tenderness.  It is somewhat indurated as well.  There was not any crepitance or fluctuance on my initial exam.  Distally she had intact sensation strength and pulses.  Patient did not have pain moving the elbow itself.  Clinically I am concerned about a cat bite with subsequent wound cellulitis.  We will get x-ray to look for foreign body or tooth fragment and will get some labs.  Anticipate reassessment and shared decision-making conversation about possible advanced imaging such as CT with contrast to further evaluate for deep abscess.  Depending on results, anticipate discussion with hand surgery to discuss if patient needs procedure versus antibiotics and monitoring.  Lactic acid downtrending here.  Other labs overall reassuring.  1:32 PM CT scan did not show evidence of deep infection abscess or osteomyelitis.  I spoke to Dr. Melvyn Novas with surgery and as there is not an acute surgical problem today, he did not feel she needed hand surgery evaluation today.  I had a shared decision conversation with patient about IV antibiotics and admission versus oral antibiotics and discharge home and patient elected to pursue oral antibiotics and discharge with close Ortho hand follow-up.   Dr. Eugenia Pancoast did say she could see him in clinic on Tuesday and he requested she be put into a an elbow splint so as not to move the arm around and spread any infection.  Patient agrees with this plan.  Will place the splint, give her a sling, give her a dose of antibiotics now.  I spoke to pharmacy who given her allergy profile recommended doxycycline and Flagyl which we will write for her.  Patient understands extremely strict return precautions for any worsening systemic symptoms or worsening discomfort.  Patient had no other questions or concerns and will be discharged.        Final Clinical Impression(s) / ED Diagnoses Final diagnoses:  Cat bite, initial encounter  Left arm cellulitis    Rx / DC Orders ED Discharge Orders          Ordered    doxycycline (VIBRAMYCIN) 100 MG capsule  2 times daily        10/21/23 1433    metroNIDAZOLE (FLAGYL) 500 MG tablet  2 times daily        10/21/23 1433           Clinical Impression: 1. Cat bite, initial encounter   2. Left arm cellulitis     Disposition: Discharge  Condition: Good  I have discussed the results, Dx and Tx plan with the pt(& family if present). He/she/they expressed understanding and agree(s) with the plan. Discharge instructions discussed at great length. Strict return precautions discussed and pt &/or family have verbalized understanding of the instructions. No further questions at time of discharge.    Current Discharge Medication List     START taking these medications   Details  doxycycline (VIBRAMYCIN) 100 MG capsule Take 1 capsule (100 mg total) by mouth 2 (two) times daily for 7 days. Qty: 14 capsule, Refills: 0    metroNIDAZOLE (FLAGYL) 500 MG tablet Take 1 tablet (500 mg total) by mouth 2 (two) times daily. Qty: 14 tablet, Refills: 0  Follow Up: Bradly Bienenstock, MD [address], [address] [place] [address] [phone]   with hand surgery. call for appointment on  tuesday.      Jacia Sickman, Canary Brim, MD 10/21/23 1434

## 2023-10-21 NOTE — ED Triage Notes (Addendum)
 Patient arrives POV with requesting a wound check on her left arm. Patient was bit by her cat 2 days ago, and she is now having creased redness and pain with touch to the site. Cat is up to date on vaccines, per patient.

## 2023-10-21 NOTE — ED Notes (Signed)
Patient transported to CT via WC

## 2023-10-21 NOTE — ED Notes (Signed)
 ED Provider at bedside.

## 2023-10-21 NOTE — ED Notes (Signed)
 Splint checked by provider, pt ready for D/C.

## 2023-10-21 NOTE — Discharge Instructions (Signed)
 Your history, exam, workup today are consistent with a cat bite causing cellulitis or skin infection left arm.  The imaging did not show evidence of foreign body and did not show abscess or deep space infection or bone infection.  We feel you are safe for discharge home after having the shared decision-making conversation discussing admission versus discharge.  He is follow-up with outpatient orthopedic hand surgery and call for appointment on Tuesday.  Please keep the arm in the splint.  If any symptoms were to change or worsen acutely, please return to the nearest emergency department for reevaluation.

## 2023-10-26 LAB — CULTURE, BLOOD (ROUTINE X 2)
Culture: NO GROWTH
Culture: NO GROWTH

## 2023-11-16 ENCOUNTER — Ambulatory Visit

## 2023-11-16 VITALS — Ht 68.0 in | Wt 285.0 lb

## 2023-11-16 DIAGNOSIS — Z860100 Personal history of colon polyps, unspecified: Secondary | ICD-10-CM

## 2023-11-16 MED ORDER — NA SULFATE-K SULFATE-MG SULF 17.5-3.13-1.6 GM/177ML PO SOLN: 1.0000 | Freq: Once | ORAL | 0 refills | Status: AC

## 2023-11-16 NOTE — Progress Notes (Signed)

## 2023-11-28 ENCOUNTER — Encounter: Payer: Self-pay | Admitting: Gastroenterology

## 2023-12-03 ENCOUNTER — Encounter: Admitting: Gastroenterology

## 2023-12-12 ENCOUNTER — Encounter: Payer: Self-pay | Admitting: Gastroenterology

## 2023-12-12 ENCOUNTER — Ambulatory Visit (AMBULATORY_SURGERY_CENTER): Admitting: Gastroenterology

## 2023-12-12 VITALS — BP 141/75 | HR 72 | Temp 98.2°F | Resp 20 | Ht 68.0 in | Wt 285.0 lb

## 2023-12-12 DIAGNOSIS — K51418 Inflammatory polyps of colon with other complication: Secondary | ICD-10-CM | POA: Diagnosis not present

## 2023-12-12 DIAGNOSIS — Z860100 Personal history of colon polyps, unspecified: Secondary | ICD-10-CM

## 2023-12-12 DIAGNOSIS — Z1211 Encounter for screening for malignant neoplasm of colon: Secondary | ICD-10-CM

## 2023-12-12 DIAGNOSIS — D123 Benign neoplasm of transverse colon: Secondary | ICD-10-CM | POA: Diagnosis not present

## 2023-12-12 DIAGNOSIS — K6389 Other specified diseases of intestine: Secondary | ICD-10-CM | POA: Diagnosis not present

## 2023-12-12 DIAGNOSIS — K5730 Diverticulosis of large intestine without perforation or abscess without bleeding: Secondary | ICD-10-CM | POA: Diagnosis not present

## 2023-12-12 DIAGNOSIS — K644 Residual hemorrhoidal skin tags: Secondary | ICD-10-CM

## 2023-12-12 DIAGNOSIS — K648 Other hemorrhoids: Secondary | ICD-10-CM | POA: Diagnosis not present

## 2023-12-12 DIAGNOSIS — K635 Polyp of colon: Secondary | ICD-10-CM

## 2023-12-12 DIAGNOSIS — Z860101 Personal history of adenomatous and serrated colon polyps: Secondary | ICD-10-CM | POA: Diagnosis not present

## 2023-12-12 DIAGNOSIS — D122 Benign neoplasm of ascending colon: Secondary | ICD-10-CM

## 2023-12-12 DIAGNOSIS — Z800 Family history of malignant neoplasm of digestive organs: Secondary | ICD-10-CM | POA: Diagnosis not present

## 2023-12-12 DIAGNOSIS — D125 Benign neoplasm of sigmoid colon: Secondary | ICD-10-CM

## 2023-12-12 MED ORDER — CIPROFLOXACIN HCL 500 MG PO TABS: 500.0000 mg | ORAL_TABLET | Freq: Two times a day (BID) | ORAL | 0 refills | Status: DC

## 2023-12-12 MED ORDER — METRONIDAZOLE 500 MG PO TABS: 500.0000 mg | ORAL_TABLET | Freq: Two times a day (BID) | ORAL | 0 refills | Status: DC

## 2023-12-12 MED ORDER — SODIUM CHLORIDE 0.9 % IV SOLN: 500.0000 mL | Freq: Once | INTRAVENOUS | Status: DC

## 2023-12-12 NOTE — Progress Notes (Signed)
 Sedate, gd SR, tolerated procedure well, VSS, report to RN

## 2023-12-12 NOTE — Op Note (Addendum)
 [facility] Patient Name: Gabriela Shepherd Procedure Date: 12/12/2023 1:50 PM MRN: [id number] Endoscopist: Sergio Dandy , MD, [id number] Age: 68 Referring MD:  Date of Birth: [birth year] Gender: Female Account #: [id number] Procedure:                Colonoscopy Indications:              Screening patient at increased risk: Family history                            of 1st-degree relative with colorectal cancer at                            age 73 years (or older), High risk colon cancer                            surveillance: Personal history of colonic polyps,                            High risk colon cancer surveillance: Personal                            history of multiple (3 or more) adenomas Medicines:                Monitored Anesthesia Care Procedure:                Pre-Anesthesia Assessment:                           - Prior to the procedure, a History and Physical                            was performed, and patient medications and                            allergies were reviewed. The patient's tolerance of                            previous anesthesia was also reviewed. The risks                            and benefits of the procedure and the sedation                            options and risks were discussed with the patient.                            All questions were answered, and informed consent                            was obtained. Prior Anticoagulants: The patient has                            taken no anticoagulant or antiplatelet agents. ASA  Grade Assessment: III - A patient with severe                            systemic disease. After reviewing the risks and                            benefits, the patient was deemed in satisfactory                            condition to undergo the procedure.                           After obtaining informed consent, the colonoscope                            was passed  under direct vision. Throughout the                            procedure, the patient's blood pressure, pulse, and                            oxygen saturations were monitored continuously. The                            Olympus Scope SN: [id number] was introduced through                            the anus and advanced to the the cecum, identified                            by appendiceal orifice and ileocecal valve. The                            colonoscopy was performed without difficulty. The                            patient tolerated the procedure well. Scope In: 1:53:25 PM Scope Out: 2:14:27 PM Scope Withdrawal Time: 0 hours 16 minutes 42 seconds  Total Procedure Duration: 0 hours 21 minutes 2 seconds  Findings:                 The perianal and digital rectal examinations were                            normal.                           A 2 mm polyp was found in the ascending colon. The                            polyp was sessile. The polyp was removed with a                            cold biopsy forceps. Resection and retrieval were  complete.                           A 5 mm polyp was found in the transverse colon. The                            polyp was sessile. The polyp was removed with a                            cold snare. Resection and retrieval were complete.                           A 3 mm polyp was found in the sigmoid colon in the                            diverticula. The polyp was sessile. The polyp was                            removed with a cold biopsy forceps. Resection and                            retrieval were complete.                           Multiple large-mouthed, medium-mouthed and                            small-mouthed diverticula were found in the sigmoid                            colon and descending colon. Peri-diverticular                            erythema was seen. There was evidence of an                             impacted diverticulum. Purulent discharge was seen                            in association with the diverticular opening,                            suspicious of diverticulitis.                           Non-bleeding external and internal hemorrhoids were                            found during retroflexion. The hemorrhoids were                            medium-sized. Complications:            No immediate complications. Estimated Blood Loss:     Estimated blood loss was minimal. Impression:               -  One 2 mm polyp in the ascending colon, removed                            with a cold biopsy forceps. Resected and retrieved.                           - One 5 mm polyp in the transverse colon, removed                            with a cold snare. Resected and retrieved.                           - One 3 mm polyp in the sigmoid colon, removed with                            a cold biopsy forceps. Resected and retrieved.                           - Moderate diverticulosis in the sigmoid colon and                            in the descending colon. Peri-diverticular erythema                            was seen. There was evidence of an impacted                            diverticulum. Purulent discharge was seen in                            association with the diverticular opening,                            suspicious of diverticulitis.                           - Non-bleeding external and internal hemorrhoids. Recommendation:           - Resume previous diet.                           - Continue present medications.                           - Await pathology results.                           - Repeat colonoscopy in 3 - 5 years for                            surveillance based on pathology results.                           - Ciprofloxacin  500mg  mg PO BID and flagyl  500mg   BID for 1 week. Valla Pacey V. Stephaine Breshears, MD 12/12/2023 2:21:46 PM This report  has been signed electronically.

## 2023-12-12 NOTE — Progress Notes (Signed)
 Called to room to assist during endoscopic procedure.  Patient ID and intended procedure confirmed with present staff. Received instructions for my participation in the procedure from the performing physician.

## 2023-12-12 NOTE — Patient Instructions (Addendum)
 Handouts provided on polyps, diverticulosis/diverticulitis and hemorrhoids.  Resume previous diet.  Continue present medications. Await pathology results.  Repeat colonoscopy in 3-5 years for surveillance based on pathology results.  Take Ciproflaxin 500mg  and Flagyl  500mg  by mouth twice daily for 1 week, for suspected diverticulitis.   YOU HAD AN ENDOSCOPIC PROCEDURE TODAY AT THE [facility]:   Refer to the procedure report that was given to you for any specific questions about what was found during the examination.  If the procedure report does not answer your questions, please call your gastroenterologist to clarify.  If you requested that your care partner not be given the details of your procedure findings, then the procedure report has been included in a sealed envelope for you to review at your convenience later.  YOU SHOULD EXPECT: Some feelings of bloating in the abdomen. Passage of more gas than usual.  Walking can help get rid of the air that was put into your GI tract during the procedure and reduce the bloating. If you had a lower endoscopy (such as a colonoscopy or flexible sigmoidoscopy) you may notice spotting of blood in your stool or on the toilet paper. If you underwent a bowel prep for your procedure, you may not have a normal bowel movement for a few days.  Please Note:  You might notice some irritation and congestion in your nose or some drainage.  This is from the oxygen used during your procedure.  There is no need for concern and it should clear up in a day or so.  SYMPTOMS TO REPORT IMMEDIATELY:  Following lower endoscopy (colonoscopy or flexible sigmoidoscopy):  Excessive amounts of blood in the stool  Significant tenderness or worsening of abdominal pains  Swelling of the abdomen that is new, acute  Fever of 100F or higher  For urgent or emergent issues, a gastroenterologist can be reached at any hour by calling [phone]. Do not use MyChart  messaging for urgent concerns.    DIET:  We do recommend a small meal at first, but then you may proceed to your regular diet.  Drink plenty of fluids but you should avoid alcoholic beverages for 24 hours.  ACTIVITY:  You should plan to take it easy for the rest of today and you should NOT DRIVE or use heavy machinery until tomorrow (because of the sedation medicines used during the test).    FOLLOW UP: Our staff will call the number listed on your records the next business day following your procedure.  We will call around 7:15- 8:00 am to check on you and address any questions or concerns that you may have regarding the information given to you following your procedure. If we do not reach you, we will leave a message.     If any biopsies were taken you will be contacted by phone or by letter within the next 1-3 weeks.  Please call us  at [phone] if you have not heard about the biopsies in 3 weeks.    SIGNATURES/CONFIDENTIALITY: You and/or your care partner have signed paperwork which will be entered into your electronic medical record.  These signatures attest to the fact that that the information above on your After Visit Summary has been reviewed and is understood.  Full responsibility of the confidentiality of this discharge information lies with you and/or your care-partner.

## 2023-12-12 NOTE — Progress Notes (Signed)
 [facility] History and Physical   Primary Care Physician:  Donley Furth, MD   Reason for Procedure:  History of adenomatous colon polyps  Plan:    Surveillance colonoscopy with possible interventions as needed     HPI: Gabriela Shepherd is a very pleasant 68 y.o. female here for surveillance colonoscopy. Denies any nausea, vomiting, abdominal pain, melena or bright red blood per rectum  The risks and benefits as well as alternatives of endoscopic procedure(s) have been discussed and reviewed. All questions answered. The patient agrees to proceed.    Past Medical History:  Diagnosis Date   Allergic rhinitis    sees Dr. 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)    Rheumatologist:  Dr. Rolley Clinton, [facility]   Anxiety    Asthma    sees Dr. Linder Revere   Basal cell carcinoma 2020   abdomen - right    Carpal tunnel syndrome    sees Dr. Aloha Arnold   Chronic kidney disease    GERD (gastroesophageal reflux disease)    Headache(784.0)    hx of pt states BP meds has improved    Hypertension    Osteoarthritis    Thyroid  disease     Past Surgical History:  Procedure Laterality Date   BASAL CELL CARCINOMA EXCISION  2020   abdomen - right    BUNIONECTOMY     CARPAL TUNNEL RELEASE     CERVICAL POLYPECTOMY N/A 04/05/2015   Procedure: CERVICAL POLYPECTOMY removal ;  Surgeon: Lillian Rein, MD;  Location: [facility];  Service: Gynecology;  Laterality: N/A;   COLONOSCOPY  01/04/2018   per Dr. Leonia Raman, adenmatous polyps, repeat in 5 yrs    DILATATION & CURRETTAGE/HYSTEROSCOPY WITH RESECTOCOPE N/A 04/05/2015   Procedure: DILATATION & CURETTAGE/HYSTEROSCOPY ;  Surgeon: Lillian Rein, MD;  Location: [facility];  Service: Gynecology;  Laterality: N/A;   DILATION AND CURETTAGE OF UTERUS     hysteroscopic polyp resection     KNEE ARTHROSCOPY     POLYPECTOMY     REFRACTIVE SURGERY     related to hole in back of left eye as stated per pt    RENAL BIOPSY     ROBOTIC ASSISTED TOTAL HYSTERECTOMY WITH  BILATERAL SALPINGO OOPHERECTOMY Bilateral 05/13/2015   Procedure: XI ROBOTIC ASSISTED TOTAL HYSTERECTOMY WITH BILATERAL SALPINGO OOPHORECTOMY;  Surgeon: Terry Ficks, MD;  Location: [facility];  Service: Gynecology;  Laterality: Bilateral;   TOTAL KNEE ARTHROPLASTY Left 05/04/2014   Procedure: LEFT TOTAL KNEE ARTHROPLASTY;  Surgeon: Bevin Bucks, MD;  Location: [facility];  Service: Orthopedics;  Laterality: Left;   TUBAL LIGATION      Prior to Admission medications   Medication Sig Start Date End Date Taking? Authorizing Provider  Acetaminophen  325 MG CAPS Take 650 mg by mouth as directed. 12/29/22   [provider]  clindamycin  (CLEOCIN ) 150 MG capsule Take 150 mg by mouth as directed. 3 tabs before dental appt    [provider]  furosemide  (LASIX ) 20 MG tablet Take 1 tablet (20 mg total) by mouth daily. 09/17/23   Donley Furth, MD  gabapentin  (NEURONTIN ) 600 MG tablet Take 1 tablet in the morning, 1 tablet in the afternoon, and 2 tablets at bedtime. 08/08/23   Patel, Donika K, DO  ibuprofen (ADVIL) 200 MG tablet Take 200 mg by mouth every 6 (six) hours as needed.    [provider]  levothyroxine  (SYNTHROID ) 50 MCG tablet Take 1 tablet (50 mcg total) by mouth daily. 09/17/23  Donley Furth, MD  losartan -hydrochlorothiazide  (HYZAAR) 100-25 MG tablet TAKE 1 TABLET BY MOUTH EVERY DAY 08/13/23   Donley Furth, MD  metroNIDAZOLE  (FLAGYL ) 500 MG tablet Take 1 tablet (500 mg total) by mouth 2 (two) times daily. 10/21/23   Tegeler, Marine Sia, MD  Multiple Vitamin (MULTIVITAMIN) tablet Take 1 tablet by mouth every morning.     [provider]  pantoprazole  (PROTONIX ) 40 MG tablet TAKE 1 TABLET BY MOUTH EVERY DAY IN THE MORNING 09/17/23   Donley Furth, MD  verapamil  (CALAN -SR) 180 MG CR tablet Take 1 tablet (180 mg total) by mouth at bedtime. 05/18/23   Donley Furth, MD  XIIDRA 5 % SOLN Apply 1 drop to eye 2 (two) times daily. Patient not taking: Reported on  11/16/2023    [provider]    Current Outpatient Medications  Medication Sig Dispense Refill   Acetaminophen  325 MG CAPS Take 650 mg by mouth as directed.     clindamycin  (CLEOCIN ) 150 MG capsule Take 150 mg by mouth as directed. 3 tabs before dental appt     furosemide  (LASIX ) 20 MG tablet Take 1 tablet (20 mg total) by mouth daily. 90 tablet 3   gabapentin  (NEURONTIN ) 600 MG tablet Take 1 tablet in the morning, 1 tablet in the afternoon, and 2 tablets at bedtime. 360 tablet 3   ibuprofen (ADVIL) 200 MG tablet Take 200 mg by mouth every 6 (six) hours as needed.     levothyroxine  (SYNTHROID ) 50 MCG tablet Take 1 tablet (50 mcg total) by mouth daily. 90 tablet 3   losartan -hydrochlorothiazide  (HYZAAR) 100-25 MG tablet TAKE 1 TABLET BY MOUTH EVERY DAY 90 tablet 1   metroNIDAZOLE  (FLAGYL ) 500 MG tablet Take 1 tablet (500 mg total) by mouth 2 (two) times daily. 14 tablet 0   Multiple Vitamin (MULTIVITAMIN) tablet Take 1 tablet by mouth every morning.      pantoprazole  (PROTONIX ) 40 MG tablet TAKE 1 TABLET BY MOUTH EVERY DAY IN THE MORNING 90 tablet 0   verapamil  (CALAN -SR) 180 MG CR tablet Take 1 tablet (180 mg total) by mouth at bedtime. 90 tablet 3   XIIDRA 5 % SOLN Apply 1 drop to eye 2 (two) times daily. (Patient not taking: Reported on 11/16/2023)     No current facility-administered medications for this visit.    Allergies as of 12/12/2023 - Review Complete 11/16/2023  Allergen Reaction Noted   Amoxicillin Itching and Rash    Penicillins Rash 03/23/2015    Family History  Problem Relation Age of Onset   Hypertension Mother    Liver cancer Mother    Heart disease Father    Hypertension Father    Colon cancer Sister    Cancer Maternal Aunt    Diabetes Maternal Grandmother    Breast cancer Neg Hx    Esophageal cancer Neg Hx    Stomach cancer Neg Hx    Rectal cancer Neg Hx     Social History   Socioeconomic History   Marital status: Single    Spouse name: Not on  file   Number of children: 3   Years of education: Not on file   Highest education level: Bachelor's degree (e.g., BA, AB, BS)  Occupational History   Not on file  Tobacco Use   Smoking status: Never   Smokeless tobacco: Never  Vaping Use   Vaping status: Never Used  Substance and Sexual Activity   Alcohol use: Not Currently   Drug use:  No   Sexual activity: Not Currently    Partners: Male    Birth control/protection: Surgical, Post-menopausal    Comment: BTL  Other Topics Concern   Not on file  Social History Narrative   Left Handed    Lives in a one story apartment   Social Drivers of Health   Financial Resource Strain: Low Risk  (09/14/2023)   Overall Financial Resource Strain (CARDIA)    Difficulty of Paying Living Expenses: Not hard at all  Food Insecurity: No Food Insecurity (09/14/2023)   Hunger Vital Sign    Worried About Running Out of Food in the Last Year: Never true    Ran Out of Food in the Last Year: Never true  Transportation Needs: No Transportation Needs (09/14/2023)   PRAPARE - Administrator, Civil Service (Medical): No    Lack of Transportation (Non-Medical): No  Physical Activity: Unknown (09/14/2023)   Exercise Vital Sign    Days of Exercise per Week: 0 days    Minutes of Exercise per Session: Not on file  Stress: No Stress Concern Present (09/14/2023)   [employer] of Occupational Health - Occupational Stress Questionnaire    Feeling of Stress : Only a little  Social Connections: Moderately Integrated (09/14/2023)   Social Connection and Isolation Panel [NHANES]    Frequency of Communication with Friends and Family: More than three times a week    Frequency of Social Gatherings with Friends and Family: Twice a week    Attends Religious Services: More than 4 times per year    Active Member of [employer] or Organizations: Yes    Attends Banker Meetings: More than 4 times per year    Marital Status: Widowed  Intimate Partner  Violence: Not on file    Review of Systems:  All other review of systems negative except as mentioned in the HPI.  Physical Exam: Vital signs in last 24 hours: LMP 11/21/2009  General:   Alert, NAD Lungs:  Clear .   Heart:  Regular rate and rhythm Abdomen:  Soft, nontender and nondistended. Neuro/Psych:  Alert and cooperative. Normal mood and affect. A and O x 3  Reviewed labs, radiology imaging, old records and pertinent past GI work up  Patient is appropriate for planned procedure(s) and anesthesia in an ambulatory setting   K. Veena Katrin Grabel , MD [phone]

## 2023-12-12 NOTE — Progress Notes (Signed)
 Pt's states no medical or surgical changes since previsit or office visit.

## 2023-12-13 ENCOUNTER — Telehealth: Payer: Self-pay

## 2023-12-13 NOTE — Telephone Encounter (Signed)
  Follow up Call-     12/12/2023    1:15 PM  Call back number  Post procedure Call Back phone  # [phone]  Permission to leave phone message Yes     Patient questions:  Do you have a fever, pain , or abdominal swelling? No. Pain Score  0 *  Have you tolerated food without any problems? Yes.    Have you been able to return to your normal activities? Yes.    Do you have any questions about your discharge instructions: Diet   No. Medications  No. Follow up visit  No.  Do you have questions or concerns about your Care? No.  Actions: * If pain score is 4 or above: No action needed, pain <4.

## 2023-12-18 ENCOUNTER — Ambulatory Visit: Payer: Self-pay | Admitting: Family Medicine

## 2023-12-18 ENCOUNTER — Other Ambulatory Visit: Payer: No Typology Code available for payment source

## 2023-12-18 DIAGNOSIS — R7303 Prediabetes: Secondary | ICD-10-CM

## 2023-12-18 DIAGNOSIS — E785 Hyperlipidemia, unspecified: Secondary | ICD-10-CM

## 2023-12-18 LAB — LIPID PANEL
Cholesterol: 174 mg/dL (ref 0–200)
HDL: 56.2 mg/dL (ref >39.00)
LDL Cholesterol: 99 mg/dL (ref 0–99)
NonHDL: 118.2
Total CHOL/HDL Ratio: 3
Triglycerides: 98 mg/dL (ref 0.0–149.0)
VLDL: 19.6 mg/dL (ref 0.0–40.0)

## 2023-12-18 LAB — SURGICAL PATHOLOGY

## 2023-12-18 LAB — HEMOGLOBIN A1C: Hgb A1c MFr Bld: 6.3 % (ref 4.6–6.5)

## 2023-12-20 ENCOUNTER — Other Ambulatory Visit: Payer: Self-pay | Admitting: Family Medicine

## 2023-12-24 ENCOUNTER — Encounter: Payer: Self-pay | Admitting: Gastroenterology

## 2024-01-23 ENCOUNTER — Encounter (INDEPENDENT_AMBULATORY_CARE_PROVIDER_SITE_OTHER): Payer: Self-pay

## 2024-01-23 ENCOUNTER — Encounter (INDEPENDENT_AMBULATORY_CARE_PROVIDER_SITE_OTHER): Payer: Self-pay | Admitting: Adult Health

## 2024-01-23 ENCOUNTER — Ambulatory Visit (INDEPENDENT_AMBULATORY_CARE_PROVIDER_SITE_OTHER): Admitting: Adult Health

## 2024-01-23 VITALS — BP 144/84 | HR 91 | Temp 98.3°F | Ht 67.0 in | Wt 279.0 lb

## 2024-01-23 DIAGNOSIS — E785 Hyperlipidemia, unspecified: Secondary | ICD-10-CM

## 2024-01-23 DIAGNOSIS — Z6841 Body Mass Index (BMI) 40.0 and over, adult: Secondary | ICD-10-CM

## 2024-01-23 DIAGNOSIS — R739 Hyperglycemia, unspecified: Secondary | ICD-10-CM

## 2024-01-23 DIAGNOSIS — Z0289 Encounter for other administrative examinations: Secondary | ICD-10-CM

## 2024-01-23 DIAGNOSIS — E559 Vitamin D deficiency, unspecified: Secondary | ICD-10-CM

## 2024-01-23 DIAGNOSIS — I10 Essential (primary) hypertension: Secondary | ICD-10-CM

## 2024-01-23 DIAGNOSIS — M3131 Wegener's granulomatosis with renal involvement: Secondary | ICD-10-CM

## 2024-01-23 NOTE — Progress Notes (Signed)
 Office: [phone]  /  Fax: [phone]   Initial Visit    Gabriela Shepherd was seen in clinic today to evaluate for obesity. She is interested in losing weight to improve overall health and reduce the risk of weight related complications. She presents today to review program treatment options, initial physical assessment, and evaluation.     She was referred by: Specialist  When asked what else they would like to accomplish? She states: Adopt a healthier eating pattern and lifestyle, Improve energy levels and physical activity, Improve existing medical conditions, Need to lose weight for R Hip Replacement and R TKA, and Improve quality of life  When asked how has your weight affected you? She states: Contributed to medical problems, Contributed to orthopedic problems or mobility issues, Having fatigue, and Having poor endurance  Weight history: She states I love to loss weight.  She has not been able to exercise regularly due to working FT 0800-1700 M-F  Highest weight: 290 lbs  Some associated conditions: Hypertension  Contributing factors: moderate to high levels of stress, reduced physical activity, chronic skipping of meals, and sedentary job  Weight promoting medications identified: None  Prior weight loss attempts: Low Carb and Balanced Plate / Portion Control  Current nutrition plan: None  Current level of physical activity: NEAT  Current or previous pharmacotherapy: None  Response to medication: Never tried medications   Past medical history includes:   Past Medical History:  Diagnosis Date   Allergic rhinitis    sees Dr. 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 Tedd, [place]   Anxiety    Asthma    sees Dr. Neysa   Basal cell carcinoma 2020   abdomen - right    Carpal tunnel syndrome    sees Dr. Camella   Chronic kidney disease    GERD (gastroesophageal reflux disease)    Headache(784.0)    hx of pt states BP meds has improved     Hypertension    Osteoarthritis    Thyroid  disease      Objective    BP (!) 144/84   Pulse 91   Temp 98.3 F (36.8 C)   Ht 5' 7 (1.702 m)   Wt 279 lb (126.6 kg)   LMP 11/21/2009   SpO2 97%   BMI 43.70 kg/m  She was weighed on the bioimpedance scale: Body mass index is 43.7 kg/m.  Body Fat%:53.0, Visceral Fat Rating:19, Weight trend over the last 12 months: Increasing  General:  Alert, oriented and cooperative. Patient is in no acute distress.  Respiratory: Normal respiratory effort, no problems with respiration noted   Gait: able to ambulate independently  Mental Status: Normal mood and affect. Normal behavior. Normal judgment and thought content.   DIAGNOSTIC DATA REVIEWED:  BMET    Component Value Date/Time   NA 138 10/21/2023 0845   K 4.5 10/21/2023 0845   CL 103 10/21/2023 0845   CO2 23 10/21/2023 0845   GLUCOSE 152 (H) 10/21/2023 0845   BUN 17 10/21/2023 0845   CREATININE 0.72 10/21/2023 0845   CREATININE 0.69 09/13/2021 0925   CALCIUM 9.1 10/21/2023 0845   GFRNONAA >60 10/21/2023 0845   GFRAA >60 05/14/2015 0528   Lab Results  Component Value Date   HGBA1C 6.3 12/18/2023   HGBA1C 5.8 09/23/2020   No results found for: INSULIN CBC    Component Value Date/Time   WBC 8.9 10/21/2023 0845   RBC 4.46 10/21/2023 0845   HGB  13.0 10/21/2023 0845   HCT 38.9 10/21/2023 0845   PLT 220 10/21/2023 0845   MCV 87.2 10/21/2023 0845   MCH 29.1 10/21/2023 0845   MCHC 33.4 10/21/2023 0845   RDW 13.7 10/21/2023 0845   Iron/TIBC/Ferritin/ %Sat No results found for: IRON, TIBC, FERRITIN, IRONPCTSAT Lipid Panel     Component Value Date/Time   CHOL 174 12/18/2023 0822   TRIG 98.0 12/18/2023 0822   HDL 56.20 12/18/2023 0822   CHOLHDL 3 12/18/2023 0822   VLDL 19.6 12/18/2023 0822   LDLCALC 99 12/18/2023 0822   LDLDIRECT 143.9 03/14/2013 0926   Hepatic Function Panel     Component Value Date/Time   PROT 7.3 10/21/2023 0845   ALBUMIN 4.1 10/21/2023  0845   AST 24 10/21/2023 0845   ALT 17 10/21/2023 0845   ALKPHOS 80 10/21/2023 0845   BILITOT 0.6 10/21/2023 0845   BILIDIR 0.0 09/17/2023 0950      Component Value Date/Time   TSH 2.44 09/17/2023 0950     Assessment and Plan   Wegener's granulomatosis with renal involvement (HCC)  Hyperglycemia  Hyperlipidemia, unspecified hyperlipidemia type  Essential hypertension  Vitamin D deficiency  Morbid obesity (HCC), STARTING BMI 43.8   Assessment and Plan         ESTABLISH WITH [facility]   Obesity Treatment / Action Plan:  Patient will work on garnering support from family and friends to begin weight loss journey. Will work on eliminating or reducing the presence of highly palatable, calorie dense foods in the home. Will complete provided nutritional and psychosocial assessment questionnaire before the next appointment. Will be scheduled for indirect calorimetry to determine resting energy expenditure in a fasting state.  This will allow us  to create a reduced calorie, high-protein meal plan to promote loss of fat mass while preserving muscle mass. Counseled on the health benefits of losing 5%-15% of total body weight. Was counseled on nutritional approaches to weight loss and benefits of reducing processed foods and consuming plant-based foods and high quality protein as part of nutritional weight management. Was counseled on pharmacotherapy and role as an adjunct in weight management.   Obesity Education Performed Today:  She was weighed on the bioimpedance scale and results were discussed and documented in the synopsis.  We discussed obesity as a disease and the importance of a more detailed evaluation of all the factors contributing to the disease.  We discussed the importance of long term lifestyle changes which include nutrition, exercise and behavioral modifications as well as the importance of customizing this to her specific health and social needs.  We discussed the  benefits of reaching a healthier weight to alleviate the symptoms of existing conditions and reduce the risks of the biomechanical, metabolic and psychological effects of obesity.  We reviewed the four pillars of obesity medicine and importance of using a multimodal approach.  We reviewed the basic principles in weight management.   Gabriela Shepherd appears to be in the action stage of change and states they are ready to start intensive lifestyle modifications and behavioral modifications.  I have spent 25 minutes in the care of the patient today including: 3 minutes before the visit reviewing and preparing the chart. 20 minutes face-to-face assessing and reviewing listed medical problems as outlined in obesity care plan, providing nutritional and behavioral counseling on topics outlined in the obesity care plan, counseling regarding anti-obesity medication as outlined in obesity care plan, independently interpreting test results and goals of care, as described in assessment  and plan, and reviewing and discussing biometric information and progress 2 minutes after the visit updating chart and documentation of encounter.  Reviewed by clinician on day of visit: allergies, medications, problem list, medical history, surgical history, family history, social history, and previous encounter notes pertinent to obesity diagnosis.  Gladiola Madore d. Kenyata Napier, NP-C

## 2024-01-31 ENCOUNTER — Other Ambulatory Visit: Payer: Self-pay | Admitting: Family Medicine

## 2024-02-06 ENCOUNTER — Ambulatory Visit: Payer: No Typology Code available for payment source | Admitting: Neurology

## 2024-02-22 ENCOUNTER — Ambulatory Visit: Payer: Self-pay | Admitting: Gastroenterology

## 2024-03-10 ENCOUNTER — Other Ambulatory Visit: Payer: Self-pay | Admitting: Family Medicine

## 2024-03-17 ENCOUNTER — Encounter: Payer: Self-pay | Admitting: Neurology

## 2024-03-17 ENCOUNTER — Ambulatory Visit: Admitting: Neurology

## 2024-03-17 ENCOUNTER — Other Ambulatory Visit: Payer: Self-pay | Admitting: Family Medicine

## 2024-03-17 VITALS — BP 137/86 | HR 79 | Ht 67.0 in | Wt 283.0 lb

## 2024-03-17 DIAGNOSIS — G63 Polyneuropathy in diseases classified elsewhere: Secondary | ICD-10-CM | POA: Diagnosis not present

## 2024-03-17 DIAGNOSIS — I7782 Antineutrophilic cytoplasmic antibody (ANCA) vasculitis: Secondary | ICD-10-CM

## 2024-03-17 NOTE — Progress Notes (Signed)
 Follow-up Visit   Date: 03/17/2024    Gabriela Shepherd MRN: [id number] DOB: [birth year]    Gabriela Shepherd is a 68 y.o. left-handed Caucasian female with ANCA-associated vasculitis (2020), hypothyroidism, hyperlipidemia returning to the clinic for follow-up of neuropathy.  The patient was accompanied to the clinic by self.   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  No history of diabetes, alcohol, or family history of PN. NCS/EMG was deferred.  Optimizing gabapentin  to 2400mg /d has significantly improved her pain.   Continue gabapentin  600mg  in the morning, 600mg  in the afternoon, and 1200mg  at bedtime.   Return to clinic in 6 months  --------------------------------------------- History of present illness: For the past 5 years, she has shooting pain in the feet and toes with associated numbness.  She used to get it in the lower legs, but this has improved on gabapentin  300mg  in the morning and 900mg  at bedtime.  Unfortunately, the shooting pain in the toes is unchanged. Symptoms are constant.  Pain is worse when anything touches her feet, such as bed sheets.  She was diagnosed with ANCA-vasculitis and takes rituximab infusions. She recalls that her feet pain was worse before she was treated for vasculitis.    She works at [employer].  She lives alone in a apartment.   UPDATE 08/02/2022:  She is here for follow-up visit.  She continues to have burning and shooting pain in the balls of the feet and toes, despite being on gabapentin  600mg  AM and 900mg  at bedtime.  She denies medication side effects. Her leg pain has significant improved.  She endorses some imbalance, but also feels this may be contributed by her knee pain.    UPDATE 02/05/2023:  She is here for 6 month follow-up.  She noticed reduced pain with increasing gabapentin  600mg  three times daily.  Overall, pain is controlled about 75% but recently, she has  been having spells of sensation of something wet on her legs or cool. She still has burning pain which pain in the feet, which occurs about once per week which is lasts a few seconds.  She has not had any falls.   UPDATE 08/08/2023:  She is here for 6 month follow-up.  She reports having burning pain involving the balls of the feet which is severe 3 of 7 nights.  She takes gabapentin  600mg  in the morning, 600mg  in the afternoon, and 900mg  at bedtime but still has breakthrough pain.  She applies frankinsence and myrrh cream which she has good relief with.  No interval falls.  She uses a cane for support.  She has chronic bilateral knee pain.   UPDATE 03/17/2024:  She is here for follow-up visit.  Since increasing gabapentin  to 600mg  in the morning and afternoon and 1200mg  at bedtime, she reports her feet pain has significantly improved.  She has rare spells of shooting pain.  No new weakness or falls.    Medications:  Current Outpatient Medications on File Prior to Visit  Medication Sig Dispense Refill   Acetaminophen  325 MG CAPS Take 650 mg by mouth as directed.     clindamycin  (CLEOCIN ) 150 MG capsule Take 150 mg by mouth as directed. 3 tabs before dental appt     furosemide  (LASIX ) 20 MG tablet Take 1 tablet (20 mg total) by mouth daily. 90 tablet 3   gabapentin  (NEURONTIN ) 600 MG tablet Take 1 tablet in the morning, 1 tablet in the afternoon, and 2 tablets at bedtime. (Patient  taking differently: Take 600 mg by mouth 3 (three) times daily. Take 1 tablet in the morning, 1 tablet in the afternoon, and 2 tablets at bedtime.) 360 tablet 3   ibuprofen (ADVIL) 200 MG tablet Take 200 mg by mouth every 6 (six) hours as needed.     levothyroxine  (SYNTHROID ) 50 MCG tablet Take 1 tablet (50 mcg total) by mouth daily. 90 tablet 3   losartan -hydrochlorothiazide  (HYZAAR) 100-25 MG tablet TAKE 1 TABLET BY MOUTH EVERY DAY 90 tablet 1   Multiple Vitamin (MULTIVITAMIN) tablet Take 1 tablet by mouth every morning.       verapamil  (CALAN -SR) 180 MG CR tablet TAKE 1 TABLET BY MOUTH AT BEDTIME. 90 tablet 3   ciprofloxacin  (CIPRO ) 500 MG tablet Take 1 tablet (500 mg total) by mouth 2 (two) times daily. (Patient not taking: Reported on 03/17/2024) 14 tablet 0   metroNIDAZOLE  (FLAGYL ) 500 MG tablet Take 1 tablet (500 mg total) by mouth 2 (two) times daily. (Patient not taking: Reported on 03/17/2024) 14 tablet 0   No current facility-administered medications on file prior to visit.    Allergies:  Allergies  Allergen Reactions   Amoxicillin Itching and Rash    Has patient had a PCN reaction causing immediate rash, facial/tongue/throat swelling, SOB or lightheadedness with hypotension: no Has patient had a PCN reaction causing severe rash involving mucus membranes or skin necrosis: no Has patient had a PCN reaction that required hospitalization no Has patient had a PCN reaction occurring within the last 10 years: no If all of the above answers are NO, then may proceed with Cephalosporin use.     Penicillins Rash    Has patient had a PCN reaction causing immediate rash, facial/tongue/throat swelling, SOB or lightheadedness with hypotension: no Has patient had a PCN reaction causing severe rash involving mucus membranes or skin necrosis: no Has patient had a PCN reaction that required hospitalization no Has patient had a PCN reaction occurring within the last 10 years: no If all of the above answers are NO, then may proceed with Cephalosporin use.     Vital Signs:  BP 137/86   Pulse 79   Ht 5' 7 (1.702 m)   Wt 283 lb (128.4 kg)   LMP 11/21/2009   SpO2 93%   BMI 44.32 kg/m   Neurological Exam: MENTAL STATUS including orientation to time, place, person, recent and remote memory, attention span and concentration, language, and fund of knowledge is normal.  Speech is not dysarthric.  CRANIAL NERVES:.  Normal conjugate, extra-ocular eye movements in all directions of gaze.  No ptosis.  Face is  symmetric.   MOTOR:  Motor strength is 5/5 in all extremities.  No atrophy, fasciculations or abnormal movements.  No pronator drift.   SENSORY:  Vibration reduced at the feet bilaterally.    COORDINATION/GAIT:  Gait is mildly wide-based, antalgic unassisted. She has a cane which she uses as needed.  Data: n/a   Thank you for allowing me to participate in patient's care.  If I can answer any additional questions, I would be pleased to do so.    Sincerely,    Arielys Wandersee K. Tobie, DO

## 2024-03-18 ENCOUNTER — Encounter (INDEPENDENT_AMBULATORY_CARE_PROVIDER_SITE_OTHER): Payer: Self-pay

## 2024-04-03 ENCOUNTER — Ambulatory Visit (INDEPENDENT_AMBULATORY_CARE_PROVIDER_SITE_OTHER): Admitting: Family Medicine

## 2024-04-17 ENCOUNTER — Ambulatory Visit (INDEPENDENT_AMBULATORY_CARE_PROVIDER_SITE_OTHER): Admitting: Family Medicine

## 2024-04-21 ENCOUNTER — Encounter (INDEPENDENT_AMBULATORY_CARE_PROVIDER_SITE_OTHER): Payer: Self-pay | Admitting: Nurse Practitioner

## 2024-04-21 ENCOUNTER — Ambulatory Visit (INDEPENDENT_AMBULATORY_CARE_PROVIDER_SITE_OTHER): Admitting: Nurse Practitioner

## 2024-04-21 VITALS — BP 127/74 | HR 79 | Temp 98.2°F | Ht 66.5 in | Wt 282.0 lb

## 2024-04-21 DIAGNOSIS — E7800 Pure hypercholesterolemia, unspecified: Secondary | ICD-10-CM

## 2024-04-21 DIAGNOSIS — I7782 Antineutrophilic cytoplasmic antibody (ANCA) vasculitis: Secondary | ICD-10-CM

## 2024-04-21 DIAGNOSIS — E559 Vitamin D deficiency, unspecified: Secondary | ICD-10-CM | POA: Diagnosis not present

## 2024-04-21 DIAGNOSIS — E039 Hypothyroidism, unspecified: Secondary | ICD-10-CM

## 2024-04-21 DIAGNOSIS — I10 Essential (primary) hypertension: Secondary | ICD-10-CM | POA: Diagnosis not present

## 2024-04-21 DIAGNOSIS — M3131 Wegener's granulomatosis with renal involvement: Secondary | ICD-10-CM

## 2024-04-21 DIAGNOSIS — Z6841 Body Mass Index (BMI) 40.0 and over, adult: Secondary | ICD-10-CM

## 2024-04-21 DIAGNOSIS — E66813 Obesity, class 3: Secondary | ICD-10-CM

## 2024-04-21 DIAGNOSIS — E1165 Type 2 diabetes mellitus with hyperglycemia: Secondary | ICD-10-CM

## 2024-04-21 DIAGNOSIS — R0602 Shortness of breath: Secondary | ICD-10-CM | POA: Diagnosis not present

## 2024-04-21 DIAGNOSIS — R5383 Other fatigue: Secondary | ICD-10-CM

## 2024-04-21 DIAGNOSIS — Z1331 Encounter for screening for depression: Secondary | ICD-10-CM | POA: Diagnosis not present

## 2024-04-21 DIAGNOSIS — R0609 Other forms of dyspnea: Secondary | ICD-10-CM

## 2024-04-21 NOTE — Progress Notes (Signed)
 [address] [place],  [place], [place] [address]  Office: [phone]  /  Fax: [phone]   Subjective   Initial Visit  Gabriela Shepherd (MR# [id number]) is a 68 y.o. female who presents for evaluation and treatment of obesity and related comorbidities. Current BMI is Body mass index is 44.83 kg/m. Gabriela Shepherd has been struggling with her weight for many years and has been unsuccessful in either losing weight, maintaining weight loss, or reaching her healthy weight goal.  Gabriela Shepherd is currently in the action stage of change and ready to dedicate time achieving and maintaining a healthier weight. Gabriela Shepherd is interested in becoming our patient and working on intensive lifestyle modifications including (but not limited to) diet and exercise for weight loss.  She wakes up 6-6:30 am and will eat until 10:30, skips lunch and dinner 5-6 PM.She is never hungry at lunch so will skip.  She does not like to cook.  Does eat out half the time- usually gets sushi bowls or salads.  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  These do limit her activity. 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 She is followed by neurology, Dr. Tobie  Weight history:  When asked how their weight has affected their life and health, she states: Contributed to medical problems, Contributed to orthopedic problems or mobility issues, Having fatigue, and Having poor endurance  When asked what else they would like to accomplish? She states: Adopt a healthier eating pattern and lifestyle, Improve energy levels and physical activity, Improve existing medical conditions, Need to lose weight for right hip and right knee replacement, and Improve quality of life  She starting to note weight gain during : teens.  Life events associated with weight gain include : pregnancy.   Other contributing factors: moderate to high levels of stress, chronic skipping of meals, and  sedentary job.  Their highest weight has been:  290 lbs.  Desired weight: 249  Previous weight-loss programs : Low Carb and Balanced Plate / Portion Control.  Their maximum weight loss was:  5 lbs.  Their greatest challenge with dieting: dieting fatigue and meal preparation and cooking.  Current or previous pharmacotherapy: None.  Response to medication: Never tried medications   Nutritional History:  Current nutrition plan: None.  How many times do you eat outside the home: 5-7 per week  How often do they skip meals: skips lunch  What beverages do they drink: water, caffeinated beverages , regular soda , juice, protein shakes , smoothies, unsweetened tea, and milk.   Use of artificial sweetners : No  Food intolerances or dislikes: peas, cantaloupe , melon.  Food triggers: None.  Food cravings: salads, sushi, asian bowls  Do they struggle with excessive hunger or portion control : No    Physical Activity:  Current level of physical activity: NEAT  Barriers to Exercise: time and orthopedic problems   Past medical history includes:   Past Medical History:  Diagnosis Date   Allergic rhinitis    sees Dr. 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 Tedd, MATTHEW   Anxiety    Asthma    sees Dr. Neysa   Basal cell carcinoma 2020   abdomen - right    Bilateral swelling of feet    Carpal tunnel syndrome    sees Dr. Camella   Chronic kidney disease    GERD (gastroesophageal reflux disease)    Headache(784.0)  hx of pt states BP meds has improved    Heartburn    Hypertension    Joint pain    Osteoarthritis    Pre-diabetes    Thyroid  disease      Objective   BP 127/74   Pulse 79   Temp 98.2 F (36.8 C)   Ht 5' 6.5 (1.689 m)   Wt 282 lb (127.9 kg)   LMP 11/21/2009   SpO2 99%   BMI 44.83 kg/m  She was weighed on the bioimpedance scale: Body mass index is 44.83 kg/m.    Anthropometrics:  Vitals Temp: 98.2 F (36.8 C) BP:  127/74 Pulse Rate: 79 SpO2: 99 %   Anthropometric Measurements Height: 5' 6.5 (1.689 m) Weight: 282 lb (127.9 kg) BMI (Calculated): 44.84 Weight at Last Visit: 0lb Weight Lost Since Last Visit: 0lb Weight Gained Since Last Visit: 0lb Starting Weight: 282lb Total Weight Loss (lbs): 0 lb (0 kg) Peak Weight: 290lb Waist Measurement : 55 inches   Body Composition  Body Fat %: 53.7 % Fat Mass (lbs): 151.8 lbs Muscle Mass (lbs): 130.8 lbs Total Body Water (lbs): 94.8 lbs Visceral Fat Rating : 19   Other Clinical Data RMR: 1944 Fasting: Yes Labs: yes Today's Visit #: 1 Starting Date: 04/21/24    Physical Exam:  General: She is overweight, cooperative, alert, well developed, and in no acute distress. PSYCH: Has normal mood, affect and thought process.   HEENT: EOMI, sclerae are anicteric. Lungs: Normal breathing effort, no conversational dyspnea. Extremities: No edema.  Neurologic: No gross sensory or motor deficits. No tremors or fasciculations noted.    Diagnostic Data Reviewed  EKG: Normal sinus rhythm, rate 76. No conduction abnormalities, abnormal Q waves or chamber enlargement.  Indirect Calorimeter completed today shows a VO2 of 282 and a REE of 1944.  Her calculated basal metabolic rate is 8102 thus her resting energy expenditure same as calculated.  Depression Screen  Gabriela Shepherd's PHQ-9 score was: 0.     04/21/2024    9:59 AM  Depression screen PHQ 2/9  Decreased Interest 0  Down, Depressed, Hopeless 0  PHQ - 2 Score 0    Screening for Sleep Related Breathing Disorders  Gabriela Shepherd admits to daytime somnolence and admits to waking up still tired. Patient has a history of symptoms of daytime fatigue,morning fatigue and hypertension. Gabriela Shepherd generally gets 7 hours of sleep per night, and states that she has generally restful sleep. Snoring is present. Apneic episodes are not present. Epworth Sleepiness Score is 6.   BMET    Component Value Date/Time   NA 138  10/21/2023 0845   K 4.5 10/21/2023 0845   CL 103 10/21/2023 0845   CO2 23 10/21/2023 0845   GLUCOSE 152 (H) 10/21/2023 0845   BUN 17 10/21/2023 0845   CREATININE 0.72 10/21/2023 0845   CREATININE 0.69 09/13/2021 0925   CALCIUM 9.1 10/21/2023 0845   GFRNONAA >60 10/21/2023 0845   GFRAA >60 05/14/2015 0528   Lab Results  Component Value Date   HGBA1C 6.3 12/18/2023   HGBA1C 5.8 09/23/2020   No results found for: INSULIN CBC    Component Value Date/Time   WBC 8.9 10/21/2023 0845   RBC 4.46 10/21/2023 0845   HGB 13.0 10/21/2023 0845   HCT 38.9 10/21/2023 0845   PLT 220 10/21/2023 0845   MCV 87.2 10/21/2023 0845   MCH 29.1 10/21/2023 0845   MCHC 33.4 10/21/2023 0845   RDW 13.7 10/21/2023 0845   Iron/TIBC/Ferritin/ %Sat No results  found for: IRON, TIBC, FERRITIN, IRONPCTSAT Lipid Panel     Component Value Date/Time   CHOL 174 12/18/2023 0822   TRIG 98.0 12/18/2023 0822   HDL 56.20 12/18/2023 0822   CHOLHDL 3 12/18/2023 0822   VLDL 19.6 12/18/2023 0822   LDLCALC 99 12/18/2023 0822   LDLDIRECT 143.9 03/14/2013 0926   Hepatic Function Panel     Component Value Date/Time   PROT 7.3 10/21/2023 0845   ALBUMIN 4.1 10/21/2023 0845   AST 24 10/21/2023 0845   ALT 17 10/21/2023 0845   ALKPHOS 80 10/21/2023 0845   BILITOT 0.6 10/21/2023 0845   BILIDIR 0.0 09/17/2023 0950      Component Value Date/Time   TSH 2.44 09/17/2023 0950     Assessment and Plan  Class 3 severe obesity without serious comorbidity with body mass index (BMI) of 40.0 to 44.9 in adult, unspecified obesity type TREATMENT PLAN FOR OBESITY:  Recommended Dietary Goals  Ayn is currently in the action stage of change. As such, her goal is to implement medically supervised obesity management plan.  She has agreed to implement: the Category 2 plan - 1200 kcal per day  Behavioral Intervention  We discussed the following Behavioral Modification Strategies today: increasing lean protein  intake to established goals, decreasing simple carbohydrates , increasing vegetables, increasing lower glycemic fruits, increasing fiber rich foods, avoiding skipping meals, increasing water intake, work on meal planning and preparation, reading food labels , keeping healthy foods at home, identifying sources and decreasing liquid calories, decreasing eating out or consumption of processed foods, and making healthy choices when eating convenient foods, planning for success, and better snacking choices  Additional resources provided today: Handout on healthy eating and balanced plate, Handout on complex carbohydrates and lean sources of protein, Category 2 packet, and Handout principles of weight management  Recommended Physical Activity Goals  Denaisha has been advised to work up to 150 minutes of moderate intensity aerobic activity a week and strengthening exercises 2-3 times per week for cardiovascular health, weight loss maintenance and preservation of muscle mass.   She has agreed to :  Think about enjoyable ways to increase daily physical activity and overcoming barriers to exercise and Increase physical activity in their day and reduce sedentary time (increase NEAT).  Medical Interventions and Pharmacotherapy We will work on building a Therapist, art and behavioral strategies. We will discuss the role of pharmacotherapy as an adjunct at subsequent visits.   ASSOCIATED CONDITIONS ADDRESSED TODAY  Other Fatigue  Wendee does feel that her weight is causing her energy to be lower than it should be. Fatigue may be related to obesity, depression or many other causes. Labs will be ordered, and in the meanwhile, Kawehi will focus on self care including making healthy food choices, increasing physical activity and focusing on stress reduction.  Shortness of Breath/Exertional dyspnea Danasha notes increasing shortness of breath with physical activity and seems to be worsening over time  with weight gain. She notes getting out of breath sooner with activity than she used to. This has not gotten worse recently. Kasy denies shortness of breath at rest or orthopnea.  Depression screening  Essential hypertension Continue Furosemide  20 mg every day, Losartan /hydrochlorothiazide  100/25 mg every day and verapamil  180 mg at bedtime Start Category 2 meal plan  and DASH diet Monitor BP and if consistently >140/90 notify PCP If develops headaches, chest pain, shortness of breath or dizziness go to ER Loss of 10-15% body weight can help improve blood pressures  -  CBC with Differential/Platelet -     Basic metabolic panel with GFR  Pure hypercholesterolemia Focus on implementing category 2 meal plan, limit saturated fats Loss of 10-15% body weight can improve lipid levels -     Lipid panel  Vitamin D deficiency If Vit D level remains low will supplement with Ergocalciferol 50000 units once a week for 12 weeks and then recheck level.   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)       Continue Gabapentin        Continue to follow with neurology  Class 3 severe obesity without serious comorbidity with body mass index (BMI) of 40.0 to 44.9 in adult, unspecified obesity type See plan above -     EKG 12-Lead -     CBC with Differential/Platelet -     Basic metabolic panel with GFR -     Insulin, random -     Lipid panel -     TSH -     Vitamin B12 -     VITAMIN D 25 Hydroxy (Vit-D Deficiency, Fractures) -      Magnesium     Follow-up  She was informed of the importance of frequent follow-up visits to maximize her success with intensive lifestyle modifications for her multiple health conditions. She was informed we would discuss her lab results at her next visit unless there is a critical issue that needs to be addressed sooner. Dorothyann agreed to keep her next visit at the agreed upon time to discuss these results.  Attestation Statement  This is the patient's intake visit at [facility]. The patient's Health Questionnaire was reviewed at length. Included in the packet: current and past health history, medications, allergies, ROS, gynecologic history (women only), surgical history, family history, social history, weight history, weight loss surgery history (for those that have had weight loss surgery), nutritional evaluation, mood and food questionnaire, PHQ9, Epworth questionnaire, sleep habits questionnaire, patient life and health improvement goals questionnaire. These will all be scanned into the patient's chart under media.   During the visit, I independently reviewed the patient's EKG, previous labs, bioimpedance scale results, and indirect calorimetry results. I used this information to medically tailor a meal plan for the patient that will help her to lose weight and will improve her obesity-related conditions. I performed a medically necessary appropriate examination and/or evaluation. I discussed the assessment and treatment plan with the patient. The patient was provided an opportunity to ask questions and all were answered. The patient agreed with the plan and demonstrated an understanding of the instructions. Labs were ordered at this visit and will be reviewed at the next visit unless critical results need to be addressed immediately. Clinical information was updated and documented in the EMR.   In addition, they received basic education on identification of processed foods and  reduction of these, different sources of lean proteins and complex carbohydrates and how to eat balanced by incorporation of whole foods.  Reviewed by clinician on day of visit: allergies, medications, problem list, medical history,  surgical history, family history, social history, and previous encounter notes.  I have spent 45 minutes in the care of the patient today including: 11 minutes before the visit reviewing and preparing the chart. 25 minutes face-to-face assessing and reviewing listed medical problems as outlined in obesity care plan, providing nutritional and behavioral counseling on topics outlined in the obesity care plan, independently interpreting test results and goals of care, as described in assessment and plan, reviewing and discussing biometric information and progress, reviewing latest PCP notes and specialist consultations, managing referral , and ordering diagnostics - see orders 9 minutes after the visit updating chart and documentation of encounter.       Hena Ewalt ANP-C

## 2024-04-22 ENCOUNTER — Ambulatory Visit (INDEPENDENT_AMBULATORY_CARE_PROVIDER_SITE_OTHER): Payer: Self-pay | Admitting: Nurse Practitioner

## 2024-04-22 LAB — BASIC METABOLIC PANEL WITH GFR
BUN/Creatinine Ratio: 25 (ref 12–28)
BUN: 17 mg/dL (ref 8–27)
CO2: 25 mmol/L (ref 20–29)
Calcium: 10 mg/dL (ref 8.7–10.3)
Chloride: 99 mmol/L (ref 96–106)
Creatinine, Ser: 0.69 mg/dL (ref 0.57–1.00)
Glucose: 85 mg/dL (ref 70–99)
Potassium: 4 mmol/L (ref 3.5–5.2)
Sodium: 140 mmol/L (ref 134–144)
eGFR: 96 mL/min/1.73 (ref >59)

## 2024-04-22 LAB — LIPID PANEL
Chol/HDL Ratio: 3.7 ratio (ref 0.0–4.4)
Cholesterol, Total: 206 mg/dL — ABNORMAL HIGH (ref 100–199)
HDL: 56 mg/dL (ref >39)
LDL Chol Calc (NIH): 128 mg/dL — ABNORMAL HIGH (ref 0–99)
Triglycerides: 121 mg/dL (ref 0–149)
VLDL Cholesterol Cal: 22 mg/dL (ref 5–40)

## 2024-04-22 LAB — CBC WITH DIFFERENTIAL/PLATELET
Basophils Absolute: 0.1 x10E3/uL (ref 0.0–0.2)
Basos: 1 %
EOS (ABSOLUTE): 0 x10E3/uL (ref 0.0–0.4)
Eos: 1 %
Hematocrit: 42.3 % (ref 34.0–46.6)
Hemoglobin: 13.6 g/dL (ref 11.1–15.9)
Immature Grans (Abs): 0 x10E3/uL (ref 0.0–0.1)
Immature Granulocytes: 0 %
Lymphocytes Absolute: 1.8 x10E3/uL (ref 0.7–3.1)
Lymphs: 36 %
MCH: 29.6 pg (ref 26.6–33.0)
MCHC: 32.2 g/dL (ref 31.5–35.7)
MCV: 92 fL (ref 79–97)
Monocytes Absolute: 0.5 x10E3/uL (ref 0.1–0.9)
Monocytes: 10 %
Neutrophils Absolute: 2.5 x10E3/uL (ref 1.4–7.0)
Neutrophils: 52 %
Platelets: 243 x10E3/uL (ref 150–450)
RBC: 4.6 x10E6/uL (ref 3.77–5.28)
RDW: 13 % (ref 11.7–15.4)
WBC: 4.8 x10E3/uL (ref 3.4–10.8)

## 2024-04-22 LAB — VITAMIN D 25 HYDROXY (VIT D DEFICIENCY, FRACTURES): Vit D, 25-Hydroxy: 27 ng/mL — ABNORMAL LOW (ref 30.0–100.0)

## 2024-04-22 LAB — MAGNESIUM: Magnesium: 1.9 mg/dL (ref 1.6–2.3)

## 2024-04-22 LAB — VITAMIN B12: Vitamin B-12: 745 pg/mL (ref 232–1245)

## 2024-04-22 LAB — TSH: TSH: 2.45 u[IU]/mL (ref 0.450–4.500)

## 2024-04-22 LAB — INSULIN, RANDOM: INSULIN: 23.7 u[IU]/mL (ref 2.6–24.9)

## 2024-05-05 ENCOUNTER — Encounter (INDEPENDENT_AMBULATORY_CARE_PROVIDER_SITE_OTHER): Payer: Self-pay | Admitting: Nurse Practitioner

## 2024-05-05 ENCOUNTER — Ambulatory Visit (INDEPENDENT_AMBULATORY_CARE_PROVIDER_SITE_OTHER): Admitting: Nurse Practitioner

## 2024-05-05 VITALS — BP 131/74 | HR 72 | Temp 98.2°F | Ht 66.5 in | Wt 274.0 lb

## 2024-05-05 DIAGNOSIS — E1165 Type 2 diabetes mellitus with hyperglycemia: Secondary | ICD-10-CM | POA: Diagnosis not present

## 2024-05-05 DIAGNOSIS — Z7984 Long term (current) use of oral hypoglycemic drugs: Secondary | ICD-10-CM

## 2024-05-05 DIAGNOSIS — E88819 Insulin resistance, unspecified: Secondary | ICD-10-CM

## 2024-05-05 DIAGNOSIS — E66813 Obesity, class 3: Secondary | ICD-10-CM

## 2024-05-05 DIAGNOSIS — E559 Vitamin D deficiency, unspecified: Secondary | ICD-10-CM

## 2024-05-05 DIAGNOSIS — I10 Essential (primary) hypertension: Secondary | ICD-10-CM

## 2024-05-05 DIAGNOSIS — Z6841 Body Mass Index (BMI) 40.0 and over, adult: Secondary | ICD-10-CM

## 2024-05-05 DIAGNOSIS — E7800 Pure hypercholesterolemia, unspecified: Secondary | ICD-10-CM

## 2024-05-05 MED ORDER — METFORMIN HCL ER 500 MG PO TB24: 500.0000 mg | ORAL_TABLET | Freq: Every day | ORAL | 0 refills | Status: DC

## 2024-05-05 MED ORDER — VITAMIN D (ERGOCALCIFEROL) 1.25 MG (50000 UNIT) PO CAPS: 50000.0000 [IU] | ORAL_CAPSULE | ORAL | 1 refills | Status: DC

## 2024-05-05 NOTE — Addendum Note (Signed)
 Addended by: JUDE BRUNET E on: 05/05/2024 04:24 PM   Modules accepted: Level of Service

## 2024-05-05 NOTE — Progress Notes (Signed)
 Office: [phone]  /  Fax: [phone]  WEIGHT SUMMARY AND BIOMETRICS  Weight Lost Since Last Visit: 8 lb  Weight Gained Since Last Visit: 0   Vitals Temp: 98.2 F (36.8 C) BP: 131/74 Pulse Rate: 72 SpO2: 99 %   Anthropometric Measurements Height: 5' 6.5 (1.689 m) Weight: 274 lb (124.3 kg) BMI (Calculated): 43.57 Weight at Last Visit: 282 lb Weight Lost Since Last Visit: 8 lb Weight Gained Since Last Visit: 0 Starting Weight: 282 lb Total Weight Loss (lbs): 8 lb (3.629 kg) Peak Weight: 290 lb Waist Measurement : 55 inches   Body Composition  Body Fat %: 53 % Fat Mass (lbs): 145.6 lbs Muscle Mass (lbs): 122.4 lbs Total Body Water (lbs): 92.2 lbs Visceral Fat Rating : 19   Other Clinical Data RMR: 1942 Fasting: no Labs: no Today's Visit #: 2 Starting Date: 04/21/24    Total Weight Loss: 8 pounds Bio Impedance Data reviewed with patient: Does not appear to be correct as it states she lost 8 pounds of muscle and 6.2 pounds of adipose( with weight loss of 8 pounds)  HPI  Chief Complaint: OBESITY  Gabriela Shepherd is here to discuss her progress with her obesity treatment plan. She is on the the Category 2 Plan and states she is following her eating plan approximately 90 % of the time. She states she is exercising 15 minutes 1-2 days per week with resistance bands.   Interval History:  Since last office visit she has been following category 2 meal plan 90 % of the time. She had a hawaiian bowl once a week for bubble bear-about 400 calories.She has been getting 32 ounces of water a day. She has skipped an occasional meal. She is trying to get 80-100 grams of protein. Dinner has been salmon and salads. Yogurt with fruit for breakfast, she will add some high protein granola. Biggest issue has been eating 3 meals.  Gabriela Shepherd does have hypertension and is currently on Hyzaar 100/25 mg every day , verapamil  180 mg at bedtime and furosemide  20 mg every day. Denies headaches,  chest pain, shortness of breath and dizziness.  BP Readings from Last 3 Encounters:  05/05/24 131/74  04/21/24 127/74  03/17/24 137/86   She is currently on levothyroxine  50 mcg every day for hypothyroidism and TSH was in normal range Lab Results  Component Value Date   TSH 2.450 04/21/2024     Labs are reviewed with patient in detail. A1c is prediabetic at 6.3. Fasting insulin is elevated at 23.7 with HOMA-IR score of 4.97 indicating insulin resistance- currently on no medication. Lipid panel shows pure hypercholesterolemia and she is currently on no medication. Vit D is low and is currently on no supplementation. Kidney functions, electrolytes, glucose, blood count, Vit B12 and thyroid  are all normal.  Cancer Screenings reviewed with patient: Pap: 2015- negative Mammo:04/28/23 Negative Colonoscopy: 12/12/23 repeat due 2030  ASCVD RISK: The 10-year ASCVD risk score (Arnett DK, et al., 2019) is: 8.2%- intermediate risk, work on weight loss and lowering cholesterol   Values used to calculate the score:     Age: 68 years     Clincally relevant sex: Female     Is Non-Hispanic African American: No     Diabetic: No     Tobacco smoker: No     Systolic Blood Pressure: 127 mmHg     Is BP treated: Yes     HDL Cholesterol: 56 mg/dL     Total Cholesterol: 206 mg/dL   PHYSICAL  EXAM:  Blood pressure 131/74, pulse 72, temperature 98.2 F (36.8 C), height 5' 6.5 (1.689 m), weight 274 lb (124.3 kg), last menstrual period 11/21/2009, SpO2 99%. Body mass index is 43.56 kg/m.  General: Well Developed, well nourished, and in no acute distress.  HEENT: Normocephalic, atraumatic; EOMI, sclerae are anicteric. Skin: Warm and dry, good turgor Chest:  Normal excursion, shape, no gross ABN Respiratory: No conversational dyspnea; speaking in full sentences NeuroM-Sk:  Normal gross ROM * 4 extremities  Psych: A and O X 3, insight adequate, mood- full    DIAGNOSTIC DATA REVIEWED:  Last metabolic  panel Lab Results  Component Value Date   GLUCOSE 85 04/21/2024   NA 140 04/21/2024   K 4.0 04/21/2024   CL 99 04/21/2024   CO2 25 04/21/2024   BUN 17 04/21/2024   CREATININE 0.69 04/21/2024   EGFR 96 04/21/2024   CALCIUM 10.0 04/21/2024    Lab Results  Component Value Date   HGBA1C 6.3 12/18/2023   HGBA1C 5.8 09/23/2020   Lab Results  Component Value Date   INSULIN 23.7 04/21/2024   Lab Results  Component Value Date   TSH 2.450 04/21/2024   CBC    Component Value Date/Time   WBC 4.8 04/21/2024 1026   WBC 8.9 10/21/2023 0845   RBC 4.60 04/21/2024 1026   RBC 4.46 10/21/2023 0845   HGB 13.6 04/21/2024 1026   HCT 42.3 04/21/2024 1026   PLT 243 04/21/2024 1026   MCV 92 04/21/2024 1026   MCH 29.6 04/21/2024 1026   MCH 29.1 10/21/2023 0845   MCHC 32.2 04/21/2024 1026   MCHC 33.4 10/21/2023 0845   RDW 13.0 04/21/2024 1026   Lipid Panel     Component Value Date/Time   CHOL 206 (H) 04/21/2024 1026   TRIG 121 04/21/2024 1026   HDL 56 04/21/2024 1026   CHOLHDL 3.7 04/21/2024 1026   CHOLHDL 3 12/18/2023 0822   VLDL 19.6 12/18/2023 0822   LDLCALC 128 (H) 04/21/2024 1026   LDLDIRECT 143.9 03/14/2013 0926    Nutritional Lab Results  Component Value Date   VD25OH 27.0 (L) 04/21/2024   Lab Results  Component Value Date   VITAMINB12 745 04/21/2024     ASSESSMENT AND PLAN  Class 3 severe obesity without serious comorbidity with body mass index (BMI) of 40.0 to 44.9 in adult, unspecified obesity type (HCC)  TREATMENT PLAN FOR OBESITY:  Recommended Dietary Goals  Gabriela Shepherd is currently in the action stage of change. As such, her goal is to continue weight management plan. She has agreed to the Category 2 Plan.  Behavioral Intervention  We discussed the following Behavioral Modification Strategies today: avoiding skipping meals, work on meal planning and preparation, continue to work on maintaining a reduced calorie state, getting the recommended amount of  protein, incorporating whole foods, making healthy choices, staying well hydrated and practicing mindfulness when eating., and increase protein intake, fibrous foods (25 grams per day for women, 30 grams for men) and water to improve satiety and decrease hunger signals. Avoid skipping meals, can supplement with a protein shake and a piece of fruit.     Recommended Physical Activity Goals  Gabriela Shepherd has been advised to work up to 150 minutes of moderate intensity aerobic activity a week and strengthening exercises 2-3 times per week for cardiovascular health, weight loss maintenance and preservation of muscle mass.   She has agreed to Think about enjoyable ways to increase daily physical activity and overcoming barriers to exercise, Increase  physical activity in their day and reduce sedentary time (increase NEAT)., and Start aerobic activity with a goal of 150 minutes a week at moderate intensity.    Pharmacotherapy We discussed various medication options to help Gabriela Shepherd with her weight loss efforts and we both agreed to start Metformin XR 500 mg 1 tab every day with meal for insulin resistance and off label for weight loss. Will also start ergocalciferol 50000 units once a week for Vitamin d deficiency and plan lab recheck in 12 weeks.   ASSOCIATED CONDITIONS ADDRESSED TODAY  Action/Plan  Essential hypertension Continue Hyzaar 100/25 mg every day , verapamil  180 mg at bedtime and furosemide  20 mg  every day Continue Category 2 meal plan  and DASH diet Monitor BP and if consistently >140/90 notify PCP If develops headaches, chest pain, shortness of breath or dizziness go to ER Loss of 10-15% body weight can help improve blood pressures   Pure hypercholesterolemia Continue category 2 meal plan, limit saturated fats Loss of 10-15% body weight can improve lipid levels Focus on getting 150 minutes a week of moderate to high intensity exercise   Type 2 diabetes mellitus with hyperglycemia, without  long-term current use of insulin (HCC) Insulin resistance Continue Category 2  meal plan, limit simple carbohydrates Decreasing body weight by 10-15% can improve glucose levels Start Metformin 500 mg 1 tab PO every day with meal Continue exercise with current goal of 150 minutes of moderate to high intensity exercise/week.  -     metFORMIN HCl ER; Take 1 tablet (500 mg total) by mouth daily with breakfast.  Dispense: 30 tablet; Refill: 0    Vitamin D deficiency Supplement with Ergocalciferol 50000 units once a week for 12 weeks and then recheck level.   -     Vitamin D (Ergocalciferol); Take 1 capsule (50,000 Units total) by mouth every 7 (seven) days.  Dispense: 5 capsule; Refill: 1      Return in about 3 weeks (around 05/26/2024).Gabriela Shepherd She was informed of the importance of frequent follow up visits to maximize her success with intensive lifestyle modifications for her multiple health conditions.   ATTESTASTION STATEMENTS:  Reviewed by clinician on day of visit: allergies, medications, problem list, medical history, surgical history, family history, social history, and previous encounter notes.   I personally spent a total of 41 minutes in the care of the patient today including preparing to see the patient, getting/reviewing separately obtained history, performing a medically appropriate exam/evaluation, counseling and educating, placing orders, referring and communicating with other health care professionals, documenting clinical information in the EHR, independently interpreting results, communicating results, and coordinating care.   Gabriela Shepherd ANP-C

## 2024-05-06 NOTE — Addendum Note (Signed)
 Addended by: JUDE BRUNET E on: 05/06/2024 12:31 PM   Modules accepted: Level of Service

## 2024-05-12 ENCOUNTER — Ambulatory Visit (INDEPENDENT_AMBULATORY_CARE_PROVIDER_SITE_OTHER): Admitting: Family Medicine

## 2024-05-26 ENCOUNTER — Encounter (INDEPENDENT_AMBULATORY_CARE_PROVIDER_SITE_OTHER): Payer: Self-pay | Admitting: Nurse Practitioner

## 2024-05-26 ENCOUNTER — Ambulatory Visit (INDEPENDENT_AMBULATORY_CARE_PROVIDER_SITE_OTHER): Payer: Self-pay | Admitting: Nurse Practitioner

## 2024-05-26 ENCOUNTER — Ambulatory Visit (INDEPENDENT_AMBULATORY_CARE_PROVIDER_SITE_OTHER): Admitting: Family Medicine

## 2024-05-26 VITALS — BP 129/73 | HR 71 | Temp 97.9°F | Ht 66.5 in | Wt 272.0 lb

## 2024-05-26 DIAGNOSIS — E1165 Type 2 diabetes mellitus with hyperglycemia: Secondary | ICD-10-CM

## 2024-05-26 DIAGNOSIS — I10 Essential (primary) hypertension: Secondary | ICD-10-CM | POA: Diagnosis not present

## 2024-05-26 DIAGNOSIS — E1169 Type 2 diabetes mellitus with other specified complication: Secondary | ICD-10-CM | POA: Diagnosis not present

## 2024-05-26 DIAGNOSIS — E559 Vitamin D deficiency, unspecified: Secondary | ICD-10-CM

## 2024-05-26 DIAGNOSIS — E66813 Obesity, class 3: Secondary | ICD-10-CM

## 2024-05-26 DIAGNOSIS — Z7984 Long term (current) use of oral hypoglycemic drugs: Secondary | ICD-10-CM

## 2024-05-26 DIAGNOSIS — Z6841 Body Mass Index (BMI) 40.0 and over, adult: Secondary | ICD-10-CM

## 2024-05-26 DIAGNOSIS — E88819 Insulin resistance, unspecified: Secondary | ICD-10-CM

## 2024-05-26 MED ORDER — METFORMIN HCL ER 500 MG PO TB24: 500.0000 mg | ORAL_TABLET | Freq: Two times a day (BID) | ORAL | 0 refills | Status: DC

## 2024-05-26 NOTE — Progress Notes (Signed)
 Office: [phone]  /  Fax: [phone]  WEIGHT SUMMARY AND BIOMETRICS  Weight Lost Since Last Visit: 2 lb  Weight Gained Since Last Visit: 0   Vitals Temp: 97.9 F (36.6 C) BP: 129/73 Pulse Rate: 71 SpO2: 96 %   Anthropometric Measurements Height: 5' 6.5 (1.689 m) Weight: 272 lb (123.4 kg) BMI (Calculated): 43.25 Weight at Last Visit: 274 lb Weight Lost Since Last Visit: 2 lb Weight Gained Since Last Visit: 0 Starting Weight: 282 lb Total Weight Loss (lbs): 10 lb (4.536 kg) Peak Weight: 290 lb Waist Measurement : 55 inches   Body Composition  Body Fat %: 53 % Fat Mass (lbs): 144.4 lbs Muscle Mass (lbs): 121.6 lbs Total Body Water (lbs): 94.8 lbs Visceral Fat Rating : 19   Other Clinical Data Fasting: no Labs: no Today's Visit #: 3 Starting Date: 04/21/24    Total Weight Loss: 10 pounds  Percent of body weight lost: 3.5%   Bio Impedance Data reviewed with patient: Muscle is down 0.8 pounds, adipose is down 1.2 pounds.   HPI  Chief Complaint: OBESITY  Gabriela Shepherd is here to discuss her progress with her obesity treatment plan. She is on the the Category 2 Plan and states she is following her eating plan approximately 75 % of the time. She states she is exercising 30 minutes 2-3 days per week.   Interval History:  Since last office visit she has been making lettuce wrap sandwiches instead of using bread, really likes it.  She is doing some fair life protein shakes.  She does think she is getting at least 70 grams of protein daily. She is trying to drink more water. She has been trying to do more activity but is limited by pain in her feet.    She was started on Metformin 500 mg daily for insulin resistance. She denies side effects.  She is on Vitamin D once a week. She has had no side effects from the medication.   Beverley does have hypertension currently well controlled with Hyzaar 100/25 mg every day, Verapamil  180 mg at bedtime and Furosemide  20 mg every  day. Denies headaches, chest pain and dizziness.  BP Readings from Last 3 Encounters:  05/26/24 129/73  05/05/24 131/74  04/21/24 127/74     PHYSICAL EXAM:  Blood pressure 129/73, pulse 71, temperature 97.9 F (36.6 C), height 5' 6.5 (1.689 m), weight 272 lb (123.4 kg), last menstrual period 11/21/2009, SpO2 96%. Body mass index is 43.24 kg/m.  General: Well Developed, well nourished, and in no acute distress.  HEENT: Normocephalic, atraumatic; EOMI, sclerae are anicteric. Skin: Warm and dry, good turgor Chest:  Normal excursion, shape, no gross ABN Respiratory: No conversational dyspnea; speaking in full sentences NeuroM-Sk:  Normal gross ROM * 4 extremities  Psych: A and O X 3, insight adequate, mood- full    DIAGNOSTIC DATA REVIEWED:  BMET    Component Value Date/Time   NA 140 04/21/2024 1026   K 4.0 04/21/2024 1026   CL 99 04/21/2024 1026   CO2 25 04/21/2024 1026   GLUCOSE 85 04/21/2024 1026   GLUCOSE 152 (H) 10/21/2023 0845   BUN 17 04/21/2024 1026   CREATININE 0.69 04/21/2024 1026   CREATININE 0.69 09/13/2021 0925   CALCIUM 10.0 04/21/2024 1026   GFRNONAA >60 10/21/2023 0845   GFRAA >60 05/14/2015 0528   Lab Results  Component Value Date   HGBA1C 6.3 12/18/2023   HGBA1C 5.8 09/23/2020   Lab Results  Component Value Date  INSULIN 23.7 04/21/2024   Lab Results  Component Value Date   TSH 2.450 04/21/2024   CBC    Component Value Date/Time   WBC 4.8 04/21/2024 1026   WBC 8.9 10/21/2023 0845   RBC 4.60 04/21/2024 1026   RBC 4.46 10/21/2023 0845   HGB 13.6 04/21/2024 1026   HCT 42.3 04/21/2024 1026   PLT 243 04/21/2024 1026   MCV 92 04/21/2024 1026   MCH 29.6 04/21/2024 1026   MCH 29.1 10/21/2023 0845   MCHC 32.2 04/21/2024 1026   MCHC 33.4 10/21/2023 0845   RDW 13.0 04/21/2024 1026   Iron Studies No results found for: IRON, TIBC, FERRITIN, IRONPCTSAT Lipid Panel     Component Value Date/Time   CHOL 206 (H) 04/21/2024 1026    TRIG 121 04/21/2024 1026   HDL 56 04/21/2024 1026   CHOLHDL 3.7 04/21/2024 1026   CHOLHDL 3 12/18/2023 0822   VLDL 19.6 12/18/2023 0822   LDLCALC 128 (H) 04/21/2024 1026   LDLDIRECT 143.9 03/14/2013 0926   Hepatic Function Panel     Component Value Date/Time   PROT 7.3 10/21/2023 0845   ALBUMIN 4.1 10/21/2023 0845   AST 24 10/21/2023 0845   ALT 17 10/21/2023 0845   ALKPHOS 80 10/21/2023 0845   BILITOT 0.6 10/21/2023 0845   BILIDIR 0.0 09/17/2023 0950      Component Value Date/Time   TSH 2.450 04/21/2024 1026   Nutritional Lab Results  Component Value Date   VD25OH 27.0 (L) 04/21/2024     ASSESSMENT AND PLAN   Class 3 severe obesity with serious comorbidity and body mass index (BMI) of 40.0 to 44.9 in adult, unspecified obesity type (HCC) TREATMENT PLAN FOR OBESITY:  Recommended Dietary Goals  Gabriela Shepherd is currently in the action stage of change. As such, her goal is to continue weight management plan. She has agreed to the Category 2 Plan.  Behavioral Intervention  We discussed the following Behavioral Modification Strategies today: increasing lean protein intake to established goals, avoiding skipping meals, increasing water intake , continue to work on maintaining a reduced calorie state, getting the recommended amount of protein, incorporating whole foods, making healthy choices, staying well hydrated and practicing mindfulness when eating., and increase protein intake, fibrous foods (25 grams per day for women, 30 grams for men) and water to improve satiety and decrease hunger signals. .    Recommended Physical Activity Goals  [name] has been advised to work up to 150 minutes of moderate intensity aerobic activity a week and strengthening exercises 2-3 times per week for cardiovascular health, weight loss maintenance and preservation of muscle mass.   She has agreed to Think about enjoyable ways to increase daily physical activity and overcoming barriers to exercise,  Increase physical activity in their day and reduce sedentary time (increase NEAT)., and Start aerobic activity with a goal of 150 minutes a week at moderate intensity.    Pharmacotherapy We discussed various medication options to help [name] with her weight loss efforts and we both agreed to increase Metformin to 500 mg BID for insulin resistance.  ASSOCIATED CONDITIONS ADDRESSED TODAY  Action/Plan  Essential hypertension Continue Hyzaar 100/25 mg every day, Verapamil  180 mg at bedtime and Furosemide  20 mg every day Continue Category 2 meal plan  and DASH diet Monitor BP and if consistently >140/90 notify PCP If develops headaches, chest pain, shortness of breath or dizziness go to ER  Type 2 diabetes mellitus with hyperglycemia, without long-term current use of insulin (HCC) Insulin  resistance Start Category 2  meal plan, limit simple carbohydrates Increase Metformin to 500 mg BID Continue exercise with current goal of 150 minutes of moderate to high intensity exercise/week.  -     metFORMIN HCl ER; Take 1 tablet (500 mg total) by mouth 2 (two) times daily with a meal.  Dispense: 60 tablet; Refill: 0  Vitamin D deficiency       Continue to supplement with Ergocalciferol 50000 units once a week- denies side effects        Vitamin d supplementation has been shown to decrease fatigue, decrease risk of progression to insulin resistance and then prediabetes, decreases risk of falling in older age and can even assist in decreasing depressive symptoms in PTSD    Type 2 diabetes mellitus associated with morbid obesity (HCC)       Continue Category 2 meal plan, limit simple carbohydrates       Increase Metformin 500 mg to BID       Increase activity as tolerated to a goal of 150 minutes/week         Return in about 4 weeks (around 06/23/2024).SABRA She was informed of the importance of frequent follow up visits to maximize her success with intensive lifestyle modifications for her multiple  health conditions.   ATTESTASTION STATEMENTS:  Reviewed by clinician on day of visit: allergies, medications, problem list, medical history, surgical history, family history, social history, and previous encounter notes.     Gittel Mccamish ANP-C

## 2024-06-02 ENCOUNTER — Encounter (HOSPITAL_BASED_OUTPATIENT_CLINIC_OR_DEPARTMENT_OTHER): Payer: Self-pay | Admitting: Obstetrics & Gynecology

## 2024-06-02 ENCOUNTER — Ambulatory Visit (HOSPITAL_BASED_OUTPATIENT_CLINIC_OR_DEPARTMENT_OTHER): Payer: No Typology Code available for payment source | Admitting: Obstetrics & Gynecology

## 2024-06-02 VITALS — BP 132/76 | HR 65 | Ht 66.0 in | Wt 277.0 lb

## 2024-06-02 DIAGNOSIS — Z90710 Acquired absence of both cervix and uterus: Secondary | ICD-10-CM | POA: Diagnosis not present

## 2024-06-02 DIAGNOSIS — E66813 Obesity, class 3: Secondary | ICD-10-CM

## 2024-06-02 DIAGNOSIS — Z01419 Encounter for gynecological examination (general) (routine) without abnormal findings: Secondary | ICD-10-CM

## 2024-06-02 DIAGNOSIS — Z1331 Encounter for screening for depression: Secondary | ICD-10-CM | POA: Diagnosis not present

## 2024-06-02 DIAGNOSIS — N951 Menopausal and female climacteric states: Secondary | ICD-10-CM | POA: Diagnosis not present

## 2024-06-02 DIAGNOSIS — Z6841 Body Mass Index (BMI) 40.0 and over, adult: Secondary | ICD-10-CM | POA: Diagnosis not present

## 2024-06-02 DIAGNOSIS — E2839 Other primary ovarian failure: Secondary | ICD-10-CM

## 2024-06-02 NOTE — Progress Notes (Signed)
 ANNUAL EXAM Patient name: Gabriela Shepherd MRN [id number]  Date of birth: 07-30-1955 Chief Complaint:   Gynecologic Exam  History of Present Illness:   ALAHNA Shepherd is a 68 y.o. G20P3003 Caucasian female being seen today for a routine annual exam.  Having some increased hot flashes.  Has gotten better in the past month.  Her face and head would get hot and then they would go away.    Patient's last menstrual period was 11/21/2009.   Last pap: Hysterectomy. H/O abnormal pap: no Last mammogram: 04/28/2023. Results were: normal. Patient states she is scheduled for one in December. Family h/o breast cancer: no.  Scheduled in December.   Last colonoscopy: 12/12/2023. Results were: abnormal three polyps. Family h/o colorectal cancer: yes sister.  F/u 5 years.       06/02/2024    8:42 AM 04/21/2024    9:59 AM 09/17/2023    9:08 AM 05/29/2022    8:43 AM 02/13/2022    8:49 AM  Depression screen PHQ 2/9  Decreased Interest 0 0 0 0 0  Down, Depressed, Hopeless 0 0 1 0 0  PHQ - 2 Score 0 0 1 0 0  Altered sleeping   1  0  Tired, decreased energy   1  1  Change in appetite   0  0  Feeling bad or failure about yourself    0  0  Trouble concentrating   0  0  Moving slowly or fidgety/restless   0  0  Suicidal thoughts   0  0  PHQ-9 Score   3   1   Difficult doing work/chores   Not difficult at all  Not difficult at all     Data saved with a previous flowsheet row definition        09/17/2023    9:08 AM  GAD 7 : Generalized Anxiety Score  Nervous, Anxious, on Edge 0  Control/stop worrying 0  Worry too much - different things 0  Trouble relaxing 0  Restless 0  Easily annoyed or irritable 0  Afraid - awful might happen 0  Total GAD 7 Score 0  Anxiety Difficulty Not difficult at all     Review of Systems:   Pertinent items are noted in HPI Denies any urinary or bowel changes.  Denies pelvic pain.   Pertinent History Reviewed:  Reviewed past medical,surgical, social and family  history.  Reviewed problem list, medications and allergies. Physical Assessment:   Vitals:   06/02/24 0839  BP: 132/76  Pulse: 65  SpO2: 99%  Weight: 277 lb (125.6 kg)  Height: 5' 6 (1.676 m)  Body mass index is 44.71 kg/m.        Physical Examination:   General appearance - well appearing, and in no distress  Mental status - alert, oriented to person, place, and time  Psych:  She has a normal mood and affect  Skin - warm and dry, normal color, no suspicious lesions noted  Chest - effort normal, all lung fields clear to auscultation bilaterally  Heart - normal rate and regular rhythm  Neck:  midline trachea, no thyromegaly or nodules  Breasts - breasts appear normal, no suspicious masses, no skin or nipple changes or  axillary nodes  Abdomen - soft, nontender, nondistended, no masses or organomegaly  Pelvic - VULVA: normal appearing vulva with no masses, tenderness or lesions   VAGINA: normal appearing vagina with normal color and discharge, no lesions   CERVIX: surgically  absent  Thin prep pap is   UTERUS: surgically absent  ADNEXA: No adnexal masses or tenderness noted.  Rectal - normal rectal, good sphincter tone, no masses felt.   Extremities:  No swelling or varicosities noted  Chaperone present for exam  No results found for this or any previous visit (from the past 24 hours).  Assessment & Plan:  1. Well woman exam with routine gynecological exam (Primary) - Pap smear not indicated.  H/o hysterectomy in 2016. - Mammogram scheduled for 06/2024 - Colonoscopy updated.  Follow up every 5 years. - Bone mineral density ordered - lab work done done September - vaccines reviewed/updated  2. H/O: hysterectomy  3. Class 3 obesity (HCC) - followed at [facility]  4. Hypoestrogenism - DG Bone Density; Future   Orders Placed This Encounter  Procedures   DG Bone Density    Meds: No orders of the defined types were placed in this  encounter.   Follow-up: Return in about 1 year (around 06/02/2025).  Ronal GORMAN Pinal, MD 06/02/2024 9:43 AM

## 2024-06-24 ENCOUNTER — Other Ambulatory Visit (INDEPENDENT_AMBULATORY_CARE_PROVIDER_SITE_OTHER): Payer: Self-pay | Admitting: Nurse Practitioner

## 2024-06-24 DIAGNOSIS — E559 Vitamin D deficiency, unspecified: Secondary | ICD-10-CM

## 2024-06-26 ENCOUNTER — Ambulatory Visit (INDEPENDENT_AMBULATORY_CARE_PROVIDER_SITE_OTHER): Admitting: Nurse Practitioner

## 2024-06-26 ENCOUNTER — Encounter (INDEPENDENT_AMBULATORY_CARE_PROVIDER_SITE_OTHER): Payer: Self-pay | Admitting: Nurse Practitioner

## 2024-06-26 VITALS — BP 120/72 | HR 98 | Temp 98.2°F | Ht 66.5 in | Wt 268.0 lb

## 2024-06-26 DIAGNOSIS — E1169 Type 2 diabetes mellitus with other specified complication: Secondary | ICD-10-CM | POA: Diagnosis not present

## 2024-06-26 DIAGNOSIS — E66813 Obesity, class 3: Secondary | ICD-10-CM

## 2024-06-26 DIAGNOSIS — E7800 Pure hypercholesterolemia, unspecified: Secondary | ICD-10-CM

## 2024-06-26 DIAGNOSIS — E88819 Insulin resistance, unspecified: Secondary | ICD-10-CM

## 2024-06-26 DIAGNOSIS — E559 Vitamin D deficiency, unspecified: Secondary | ICD-10-CM

## 2024-06-26 DIAGNOSIS — E1165 Type 2 diabetes mellitus with hyperglycemia: Secondary | ICD-10-CM

## 2024-06-26 DIAGNOSIS — Z7984 Long term (current) use of oral hypoglycemic drugs: Secondary | ICD-10-CM

## 2024-06-26 DIAGNOSIS — Z6841 Body Mass Index (BMI) 40.0 and over, adult: Secondary | ICD-10-CM

## 2024-06-26 DIAGNOSIS — I10 Essential (primary) hypertension: Secondary | ICD-10-CM

## 2024-06-26 MED ORDER — VITAMIN D (ERGOCALCIFEROL) 1.25 MG (50000 UNIT) PO CAPS: 50000.0000 [IU] | ORAL_CAPSULE | ORAL | 1 refills | Status: DC

## 2024-06-26 MED ORDER — METFORMIN HCL ER 500 MG PO TB24: 500.0000 mg | ORAL_TABLET | Freq: Two times a day (BID) | ORAL | 0 refills | Status: DC

## 2024-06-26 NOTE — Progress Notes (Signed)
 Office: [phone]  /  Fax: [phone]  WEIGHT SUMMARY AND BIOMETRICS  Weight Lost Since Last Visit: 4 lb  Weight Gained Since Last Visit: 0   Vitals Temp: 98.2 F (36.8 C) BP: 120/72 Pulse Rate: 98 SpO2: 97 %   Anthropometric Measurements Height: 5' 6.5 (1.689 m) Weight: 268 lb (121.6 kg) BMI (Calculated): 42.61 Weight at Last Visit: 272 lb Weight Lost Since Last Visit: 4 lb Weight Gained Since Last Visit: 0 Starting Weight: 282 lb Total Weight Loss (lbs): 14 lb (6.35 kg) Peak Weight: 290 lb Waist Measurement : 55 inches   Body Composition  Body Fat %: 52.6 % Fat Mass (lbs): 141.2 lbs Muscle Mass (lbs): 120.8 lbs Total Body Water (lbs): 93.6 lbs Visceral Fat Rating : 18   Other Clinical Data Fasting: no Labs: no Today's Visit #: 4 Starting Date: 04/21/24    Total Weight Loss:14 pounds Percent of body weight lost: 4.9%   Bio Impedance Data reviewed with patient: Muscle is down 0.8 pounds, adipose is down 3.2 pounds. Visceral fat rating has decreased 1 point from 19 to 18.   HPI  Chief Complaint: OBESITY  Gabriela Shepherd is here to discuss her progress with her obesity treatment plan. She is on the the Category 2 Plan and states she is following her eating plan approximately 90 % of the time. She states she is exercising 30 minutes 2 days per week.   Interval History:  Since last office visit she is getting 80-100 grams of protein daily. She drinks a lot of water- getting 64 ounces a day at least. She is doing well getting her fruits and vegetables daily. Eating lots of salads.  She did well over Thanksgiving.   She continues to have pain in the balls of her feet (pins and needles/burning)and right knee pain . Does as much walking as she can tolerate.   Gabriela Shepherd does have hypertension and her BP's are currently well controlled on Losartan  /hydrochlorothiazide  100/25 mg every day, verapamil  180 mg QHS every day and Lasix  20 mg every day . Denies headaches, chest  pain shortness of breath at rest and dizziness  BP Readings from Last 3 Encounters:  06/26/24 120/72  06/02/24 132/76  05/26/24 129/73   She does have pure hypercholesterolemia and is currently on no medication.  Working on nutrition , exercise and weight loss to lower cholesterol.   She does have Type 2 diabetes mellitus and insulin  resistance and is currently on Metformin  500 mg BID- no side effects  She continues on Ergocalciferol  50000 units once a week for Vit D deficiency and denies side effects.   PHYSICAL EXAM:  Blood pressure 120/72, pulse 98, temperature 98.2 F (36.8 C), height 5' 6.5 (1.689 m), weight 268 lb (121.6 kg), last menstrual period 11/21/2009, SpO2 97%. Body mass index is 42.61 kg/m.  General: Well Developed, well nourished, and in no acute distress.  HEENT: Normocephalic, atraumatic; EOMI, sclerae are anicteric. Skin: Warm and dry, good turgor Chest:  Normal excursion, shape, no gross ABN Respiratory: No conversational dyspnea; speaking in full sentences NeuroM-Sk:  Normal gross ROM * 4 extremities . Antalgic gait with cane due to right knee pain Psych: A and O X 3, insight adequate, mood- full    DIAGNOSTIC DATA REVIEWED:  BMET    Component Value Date/Time   NA 140 04/21/2024 1026   K 4.0 04/21/2024 1026   CL 99 04/21/2024 1026   CO2 25 04/21/2024 1026   GLUCOSE 85 04/21/2024 1026   GLUCOSE  152 (H) 10/21/2023 0845   BUN 17 04/21/2024 1026   CREATININE 0.69 04/21/2024 1026   CREATININE 0.69 09/13/2021 0925   CALCIUM 10.0 04/21/2024 1026   GFRNONAA >60 10/21/2023 0845   GFRAA >60 05/14/2015 0528   Lab Results  Component Value Date   HGBA1C 6.3 12/18/2023   HGBA1C 5.8 09/23/2020   Lab Results  Component Value Date   INSULIN  23.7 04/21/2024   Lab Results  Component Value Date   TSH 2.450 04/21/2024   CBC    Component Value Date/Time   WBC 4.8 04/21/2024 1026   WBC 8.9 10/21/2023 0845   RBC 4.60 04/21/2024 1026   RBC 4.46 10/21/2023  0845   HGB 13.6 04/21/2024 1026   HCT 42.3 04/21/2024 1026   PLT 243 04/21/2024 1026   MCV 92 04/21/2024 1026   MCH 29.6 04/21/2024 1026   MCH 29.1 10/21/2023 0845   MCHC 32.2 04/21/2024 1026   MCHC 33.4 10/21/2023 0845   RDW 13.0 04/21/2024 1026   Iron Studies No results found for: IRON, TIBC, FERRITIN, IRONPCTSAT Lipid Panel     Component Value Date/Time   CHOL 206 (H) 04/21/2024 1026   TRIG 121 04/21/2024 1026   HDL 56 04/21/2024 1026   CHOLHDL 3.7 04/21/2024 1026   CHOLHDL 3 12/18/2023 0822   VLDL 19.6 12/18/2023 0822   LDLCALC 128 (H) 04/21/2024 1026   LDLDIRECT 143.9 03/14/2013 0926   Hepatic Function Panel     Component Value Date/Time   PROT 7.3 10/21/2023 0845   ALBUMIN 4.1 10/21/2023 0845   AST 24 10/21/2023 0845   ALT 17 10/21/2023 0845   ALKPHOS 80 10/21/2023 0845   BILITOT 0.6 10/21/2023 0845   BILIDIR 0.0 09/17/2023 0950      Component Value Date/Time   TSH 2.450 04/21/2024 1026   Nutritional Lab Results  Component Value Date   VD25OH 27.0 (L) 04/21/2024     ASSESSMENT AND PLAN  Class 3 severe obesity with serious comorbidity and body mass index (BMI) of 40.0 to 44.9 in adult, unspecified obesity type (HCC) Type 2 diabetes mellitus associated with morbid obesity (HCC) TREATMENT PLAN FOR OBESITY:  Recommended Dietary Goals  Gabriela Shepherd is currently in the action stage of change. As such, her goal is to continue weight management plan. She has agreed to the Category 2 Plan.  Behavioral Intervention  We discussed the following Behavioral Modification Strategies today: celebration eating strategies, continue to work on maintaining a reduced calorie state, getting the recommended amount of protein, incorporating whole foods, making healthy choices, staying well hydrated and practicing mindfulness when eating., and increase protein intake, fibrous foods (25 grams per day for women, 30 grams for men) and water to improve satiety and decrease hunger  signals. .  Celebration eating strategies She wants to lose weight in December - She does  recognize that she must follow a structured plan and will eat before social situation to meet her protein goals and minimze party foods - She will give away food gifts unless they are very low calories or high protein - She will avoid calorie-containing liquids such as holiday drinks, eggnog and alcohol -She will stick to her structured plan strictly most of the time - This strategy should help her lose 1-2 pounds in December   Recommended Physical Activity Goals  Folashade has been advised to work up to 150 minutes of moderate intensity aerobic activity a week and strengthening exercises 2-3 times per week for cardiovascular health, weight loss maintenance and  preservation of muscle mass.   She has agreed to Increase physical activity in their day and reduce sedentary time (increase NEAT). and Start aerobic activity with a goal of 150 minutes a week at moderate intensity as tolerated.    Pharmacotherapy We discussed various medication options to help Gabriela Shepherd with her weight loss efforts and we both agreed to Continue Metformin  500 mg 1 tab PO BID for type 2 diabetes/insulin  resistance- denies side effects and Ergocalciferol  50000 units once a week for Vit D deficiency- denies side effects. .  ASSOCIATED CONDITIONS ADDRESSED TODAY  Action/Plan  Vitamin D  deficiency Continue to supplement with Ergocalciferol  50000 units once a week Low vitamin D  levels can be associated with adiposity and may result in leptin resistance and weight gain. Also associated with fatigue.  Currently on vitamin D  supplementation without any adverse effects such as nausea, vomiting or muscle weakness.   -     Vitamin D  (Ergocalciferol ); Take 1 capsule (50,000 Units total) by mouth every 7 (seven) days.  Dispense: 5 capsule; Refill: 1  Essential hypertension Continue Losartan  /hydrochlorothiazide  100/25 mg every day, verapamil   180 mg QHS every day and Lasix  20 mg every day  Continue Category 2 meal plan  and DASH diet Monitor BP and if consistently >140/90 notify PCP If develops headaches, chest pain, shortness of breath or dizziness go to ER Continue to follow regularly with PCP  Type 2 diabetes mellitus with hyperglycemia, without long-term current use of insulin  (HCC) Continue Category 2  meal plan, limit simple carbohydrates, limit saturated fats and increase lean protein Continue Metformin  XR 500 mg BID Continue to follow regularly with PCP Continue exercise with current goal of 150 minutes of moderate to high intensity exercise/week as tolerated.    Pure hypercholesterolemia Focus on implementing category 2 meal plan, limit saturated fats, increase lean protein and limit simple carbs Continue to follow with PCP regularly Focus on getting 150 minutes a week of moderate to high intensity exercise as tolerated  Insulin  resistance Continue Category 2  meal plan, limit simple carbohydrates, limit saturated fats and increase lean protein Continue Metformin  XR 500 mg BID Continue to follow regularly with PCP Continue exercise with current goal of 150 minutes of moderate to high intensity exercise/week as tolerated.  -     metFORMIN  HCl ER; Take 1 tablet (500 mg total) by mouth 2 (two) times daily with a meal.  Dispense: 60 tablet; Refill: 0         Return in about 5 weeks (around 07/31/2024).Gabriela Shepherd She was informed of the importance of frequent follow up visits to maximize her success with intensive lifestyle modifications for her multiple health conditions.   ATTESTASTION STATEMENTS:  Reviewed by clinician on day of visit: allergies, medications, problem list, medical history, surgical history, family history, social history, and previous encounter notes.     Jarquis Walker ANP-C

## 2024-07-12 ENCOUNTER — Other Ambulatory Visit: Payer: Self-pay | Admitting: Family Medicine

## 2024-07-14 ENCOUNTER — Encounter (HOSPITAL_BASED_OUTPATIENT_CLINIC_OR_DEPARTMENT_OTHER): Payer: Self-pay | Admitting: Radiology

## 2024-07-14 ENCOUNTER — Other Ambulatory Visit (HOSPITAL_BASED_OUTPATIENT_CLINIC_OR_DEPARTMENT_OTHER): Payer: Self-pay | Admitting: Family Medicine

## 2024-07-14 ENCOUNTER — Ambulatory Visit (HOSPITAL_BASED_OUTPATIENT_CLINIC_OR_DEPARTMENT_OTHER)
Admission: RE | Admit: 2024-07-14 | Discharge: 2024-07-14 | Disposition: A | Discharge status: Home / Self Care | Source: Ambulatory Visit

## 2024-07-14 DIAGNOSIS — Z1231 Encounter for screening mammogram for malignant neoplasm of breast: Secondary | ICD-10-CM | POA: Diagnosis present

## 2024-07-21 ENCOUNTER — Other Ambulatory Visit (INDEPENDENT_AMBULATORY_CARE_PROVIDER_SITE_OTHER): Payer: Self-pay | Admitting: Nurse Practitioner

## 2024-07-21 DIAGNOSIS — E88819 Insulin resistance, unspecified: Secondary | ICD-10-CM

## 2024-07-28 ENCOUNTER — Ambulatory Visit (INDEPENDENT_AMBULATORY_CARE_PROVIDER_SITE_OTHER): Admitting: Nurse Practitioner

## 2024-07-28 ENCOUNTER — Encounter (INDEPENDENT_AMBULATORY_CARE_PROVIDER_SITE_OTHER): Payer: Self-pay | Admitting: Nurse Practitioner

## 2024-07-28 VITALS — BP 138/73 | HR 77 | Temp 97.8°F | Ht 66.5 in | Wt 267.0 lb

## 2024-07-28 DIAGNOSIS — E7800 Pure hypercholesterolemia, unspecified: Secondary | ICD-10-CM | POA: Diagnosis not present

## 2024-07-28 DIAGNOSIS — I10 Essential (primary) hypertension: Secondary | ICD-10-CM

## 2024-07-28 DIAGNOSIS — E1159 Type 2 diabetes mellitus with other circulatory complications: Secondary | ICD-10-CM

## 2024-07-28 DIAGNOSIS — E1169 Type 2 diabetes mellitus with other specified complication: Secondary | ICD-10-CM | POA: Diagnosis not present

## 2024-07-28 DIAGNOSIS — E1165 Type 2 diabetes mellitus with hyperglycemia: Secondary | ICD-10-CM

## 2024-07-28 DIAGNOSIS — E66813 Obesity, class 3: Secondary | ICD-10-CM

## 2024-07-28 DIAGNOSIS — Z7984 Long term (current) use of oral hypoglycemic drugs: Secondary | ICD-10-CM

## 2024-07-28 DIAGNOSIS — E559 Vitamin D deficiency, unspecified: Secondary | ICD-10-CM | POA: Diagnosis not present

## 2024-07-28 DIAGNOSIS — Z6841 Body Mass Index (BMI) 40.0 and over, adult: Secondary | ICD-10-CM | POA: Diagnosis not present

## 2024-07-28 DIAGNOSIS — E88819 Insulin resistance, unspecified: Secondary | ICD-10-CM

## 2024-07-28 DIAGNOSIS — I152 Hypertension secondary to endocrine disorders: Secondary | ICD-10-CM

## 2024-07-28 MED ORDER — METFORMIN HCL ER 500 MG PO TB24: 500.0000 mg | ORAL_TABLET | Freq: Two times a day (BID) | ORAL | 0 refills | Status: AC

## 2024-07-28 MED ORDER — VITAMIN D (ERGOCALCIFEROL) 1.25 MG (50000 UNIT) PO CAPS: 50000.0000 [IU] | ORAL_CAPSULE | ORAL | 1 refills | Status: AC

## 2024-07-28 NOTE — Progress Notes (Signed)
 " Office: [phone]  /  Fax: [phone]  WEIGHT SUMMARY AND BIOMETRICS  Weight Lost Since Last Visit: 1 lb  Weight Gained Since Last Visit: 0   Vitals Temp: 97.8 F (36.6 C) BP: 138/73 Pulse Rate: 77 SpO2: 97 %   Anthropometric Measurements Height: 5' 6.5 (1.689 m) Weight: 267 lb (121.1 kg) BMI (Calculated): 42.45 Weight at Last Visit: 268 lb Weight Lost Since Last Visit: 1 lb Weight Gained Since Last Visit: 0 Starting Weight: 282 lb Total Weight Loss (lbs): 15 lb (6.804 kg) Peak Weight: 290 lb   Body Composition  Body Fat %: 52.5 % Fat Mass (lbs): 140.2 lbs Muscle Mass (lbs): 120.6 lbs Total Body Water (lbs): 95 lbs Visceral Fat Rating : 18   Other Clinical Data Fasting: Yes Labs: No Today's Visit #: 5 Starting Date: 04/21/24    Total Weight Loss: 15 pounds Percent of body weight lost:5.3%   Bio Impedance Data reviewed with patient: Muscle is down 0.2 pounds, Adipose is down 1 pound.   HPI  Chief Complaint: OBESITY  Gabriela Shepherd is here to discuss her progress with her obesity treatment plan. She is on the the Category 2 Plan and states she is following her eating plan approximately 80 % of the time. She states she is exercising 15 minutes 3 days per week with pedal stepper(like [employer]).   Interval History:  Since last office visit she had Christmas and had some things off of plan.  She has been back on plan- tries to eat a lot of protein. 24-40 g protein for lunch and dinner. She does greek yogurt and fruit with protein granola for breakfast. She is doing protein shakes for 1 meal a day occasionally.  She will snack on fruit and some salads. She is drinking water, approximately 64-80 ounces a day She has started exercising by doing seated elliptical for 15 minutes 3 days a week. Only doing when legs aren't bothering her  Gabriela Shepherd does have hypertension and her BP's are currently well controlled on Furosemide  20 mg every day, Hyzaar 100/25 mg every day and  verapamil  180 mg every day. Denies headaches, chest pain shortness of breath at rest and dizziness  BP Readings from Last 3 Encounters:  07/28/24 138/73  06/26/24 120/72  06/02/24 132/76   She is taking Metformin  500 mg BID for Type 2 Diabetes and insulin  resistance.  Denies side effects with the medication.   She continues to take Ergocalciferol  50000 units once a week for Vit D def, no side effects with the medication.   PHYSICAL EXAM:  Blood pressure 138/73, pulse 77, temperature 97.8 F (36.6 C), height 5' 6.5 (1.689 m), weight 267 lb (121.1 kg), last menstrual period 11/21/2009, SpO2 97%. Body mass index is 42.45 kg/m.  General: Well Developed, well nourished, and in no acute distress.  HEENT: Normocephalic, atraumatic; EOMI, sclerae are anicteric. Skin: Warm and dry, good turgor Chest:  Normal excursion, shape, no gross ABN Respiratory: No conversational dyspnea; speaking in full sentences NeuroM-Sk:  Normal gross ROM * 4 extremities . Antalgic gait with cane Psych: A and O X 3, insight adequate, mood- full    DIAGNOSTIC DATA REVIEWED:  BMET    Component Value Date/Time   NA 140 04/21/2024 1026   K 4.0 04/21/2024 1026   CL 99 04/21/2024 1026   CO2 25 04/21/2024 1026   GLUCOSE 85 04/21/2024 1026   GLUCOSE 152 (H) 10/21/2023 0845   BUN 17 04/21/2024 1026   CREATININE 0.69 04/21/2024 1026  CREATININE 0.69 09/13/2021 0925   CALCIUM 10.0 04/21/2024 1026   GFRNONAA >60 10/21/2023 0845   GFRAA >60 05/14/2015 0528   Lab Results  Component Value Date   HGBA1C 6.3 12/18/2023   HGBA1C 5.8 09/23/2020   Lab Results  Component Value Date   INSULIN  23.7 04/21/2024   Lab Results  Component Value Date   TSH 2.450 04/21/2024   CBC    Component Value Date/Time   WBC 4.8 04/21/2024 1026   WBC 8.9 10/21/2023 0845   RBC 4.60 04/21/2024 1026   RBC 4.46 10/21/2023 0845   HGB 13.6 04/21/2024 1026   HCT 42.3 04/21/2024 1026   PLT 243 04/21/2024 1026   MCV 92  04/21/2024 1026   MCH 29.6 04/21/2024 1026   MCH 29.1 10/21/2023 0845   MCHC 32.2 04/21/2024 1026   MCHC 33.4 10/21/2023 0845   RDW 13.0 04/21/2024 1026   Iron Studies No results found for: IRON, TIBC, FERRITIN, IRONPCTSAT Lipid Panel     Component Value Date/Time   CHOL 206 (H) 04/21/2024 1026   TRIG 121 04/21/2024 1026   HDL 56 04/21/2024 1026   CHOLHDL 3.7 04/21/2024 1026   CHOLHDL 3 12/18/2023 0822   VLDL 19.6 12/18/2023 0822   LDLCALC 128 (H) 04/21/2024 1026   LDLDIRECT 143.9 03/14/2013 0926   Hepatic Function Panel     Component Value Date/Time   PROT 7.3 10/21/2023 0845   ALBUMIN 4.1 10/21/2023 0845   AST 24 10/21/2023 0845   ALT 17 10/21/2023 0845   ALKPHOS 80 10/21/2023 0845   BILITOT 0.6 10/21/2023 0845   BILIDIR 0.0 09/17/2023 0950      Component Value Date/Time   TSH 2.450 04/21/2024 1026   Nutritional Lab Results  Component Value Date   VD25OH 27.0 (L) 04/21/2024     ASSESSMENT AND PLAN Class 3 severe obesity with serious comorbidity and body mass index (BMI) of 40.0 to 44.9 in adult, unspecified obesity type (HCC) Type 2 diabetes mellitus associated with morbid obesity (HCC) TREATMENT PLAN FOR OBESITY:  Recommended Dietary Goals  Gabriela Shepherd is currently in the action stage of change. As such, her goal is to continue weight management plan. She has agreed to the Category 2 Plan.  Behavioral Intervention  We discussed the following Behavioral Modification Strategies today: increasing lean protein intake to established goals, increasing vegetables, increasing lower glycemic fruits, increasing fiber rich foods, avoiding skipping meals, increasing water intake , work on meal planning and preparation, continue to work on maintaining a reduced calorie state, getting the recommended amount of protein, incorporating whole foods, making healthy choices, staying well hydrated and practicing mindfulness when eating., and increase protein intake, fibrous  foods (25 grams per day for women, 30 grams for men) and water to improve satiety and decrease hunger signals.  Discussed use of long life prep for variety in her meals as she does not like to cook.   Additional resources provided today: Long Life prep menu and hours  Recommended Physical Activity Goals  Gabriela Shepherd has been advised to work up to 150 minutes of moderate intensity aerobic activity a week and strengthening exercises 2-3 times per week for cardiovascular health, weight loss maintenance and preservation of muscle mass.   She has agreed to Continue current level of physical activity , Think about enjoyable ways to increase daily physical activity and overcoming barriers to exercise, and Increase physical activity in their day and reduce sedentary time (increase NEAT).   Pharmacotherapy We discussed various medication options to  help Gabriela Shepherd with her weight loss efforts and we both agreed to continue Metformin  XR 500 mg BID for insulin  resistance/Type 2 diabetes- denies side effects.  Ergocalciferol  50000 units once a week for Vit D deficiency- denies side effects.  ASSOCIATED CONDITIONS ADDRESSED TODAY  Action/Plan  Type 2 diabetes mellitus with hyperglycemia, without long-term current use of insulin  (HCC) Continue Category 2  meal plan, limit simple carbohydrates Continue Metformin  XR 500 mg BID- denies side effects Continue exercise with current goal of 3-4 times of seated elliptical / week -     metFORMIN  HCl ER; Take 1 tablet (500 mg total) by mouth 2 (two) times daily with a meal.  Dispense: 60 tablet; Refill: 0   Hypertension associated with type 2 diabetes mellitus (HCC) Continue Furosemide  20 mg every day, Hyzaar 100/25 mg every day and verapamil  180 mg every day Continue Category 2 meal plan  and DASH diet Monitor BP and if consistently >140/90 notify PCP If develops headaches, chest pain, shortness of breath or dizziness go to ER Continue to follow regularly with  PP  Vitamin D  deficiency Supplement with Ergocalciferol  50000 units once a week  -     Vitamin D  (Ergocalciferol ); Take 1 capsule (50,000 Units total) by mouth every 7 (seven) days.  Dispense: 5 capsule; Refill: 1 -     VITAMIN D  25 Hydroxy (Vit-D Deficiency, Fractures)  Insulin  resistance Continue Category 2  meal plan, limit simple carbohydrates Continue Metformin  XR 500 mg BID- denies side effects Continue exercise with current goal of 3-4 times of seated elliptical / week -     metFORMIN  HCl ER; Take 1 tablet (500 mg total) by mouth 2 (two) times daily with a meal.  Dispense: 60 tablet; Refill: 0  Pure hypercholesterolemia Focus on implementing category 2 meal plan, limit saturated fats. Increase lean protein, fiber and water Loss of 10-15% body weight can improve lipid levels Continue exercise with current goal of 3-4 times of seated elliptical / week -     Lipid panel         Return in about 4 weeks (around 08/25/2024).Gabriela Shepherd She was informed of the importance of frequent follow up visits to maximize her success with intensive lifestyle modifications for her multiple health conditions.   ATTESTASTION STATEMENTS:  Reviewed by clinician on day of visit: allergies, medications, problem list, medical history, surgical history, family history, social history, and previous encounter notes.     Lonell Liverpool ANP-C "

## 2024-07-29 ENCOUNTER — Ambulatory Visit (INDEPENDENT_AMBULATORY_CARE_PROVIDER_SITE_OTHER): Payer: Self-pay | Admitting: Nurse Practitioner

## 2024-07-29 LAB — LIPID PANEL
Chol/HDL Ratio: 4.4 ratio (ref 0.0–4.4)
Cholesterol, Total: 228 mg/dL — ABNORMAL HIGH (ref 100–199)
HDL: 52 mg/dL
LDL Chol Calc (NIH): 153 mg/dL — ABNORMAL HIGH (ref 0–99)
Triglycerides: 131 mg/dL (ref 0–149)
VLDL Cholesterol Cal: 23 mg/dL (ref 5–40)

## 2024-07-29 LAB — VITAMIN D 25 HYDROXY (VIT D DEFICIENCY, FRACTURES): Vit D, 25-Hydroxy: 35.6 ng/mL (ref 30.0–100.0)

## 2024-08-04 ENCOUNTER — Encounter (INDEPENDENT_AMBULATORY_CARE_PROVIDER_SITE_OTHER): Payer: Self-pay | Admitting: Nurse Practitioner

## 2024-08-06 ENCOUNTER — Telehealth (INDEPENDENT_AMBULATORY_CARE_PROVIDER_SITE_OTHER): Payer: Self-pay | Admitting: *Deleted

## 2024-08-06 NOTE — Telephone Encounter (Signed)
 Please advise there are no noted interactions between the two medications.  She should be able to take her Metformin  that day. Thanks Lonell Liverpool ANP-C  Called patient and updated her per Dana's message  that she is ok with taking her medication (Metformin )during that day,verbalized understanding.

## 2024-08-25 ENCOUNTER — Ambulatory Visit (INDEPENDENT_AMBULATORY_CARE_PROVIDER_SITE_OTHER): Admitting: Nurse Practitioner

## 2024-09-18 ENCOUNTER — Ambulatory Visit (INDEPENDENT_AMBULATORY_CARE_PROVIDER_SITE_OTHER): Admitting: Nurse Practitioner

## 2024-09-24 ENCOUNTER — Ambulatory Visit: Admitting: Neurology
